# Patient Record
Sex: Male | Born: 1950 | Race: White | Hispanic: No | Marital: Married | Smoking: Former smoker
Health system: Southern US, Community
[De-identification: ages and names within clinical notes are randomized; demographics above are authoritative.]

## PROBLEM LIST (undated history)

## (undated) DIAGNOSIS — M1990 Unspecified osteoarthritis, unspecified site: Secondary | ICD-10-CM

## (undated) DIAGNOSIS — G971 Other reaction to spinal and lumbar puncture: Secondary | ICD-10-CM

## (undated) DIAGNOSIS — Z87442 Personal history of urinary calculi: Secondary | ICD-10-CM

## (undated) DIAGNOSIS — K219 Gastro-esophageal reflux disease without esophagitis: Secondary | ICD-10-CM

## (undated) DIAGNOSIS — I4891 Unspecified atrial fibrillation: Secondary | ICD-10-CM

## (undated) HISTORY — DX: Unspecified atrial fibrillation: I48.91

## (undated) HISTORY — DX: Gastro-esophageal reflux disease without esophagitis: K21.9

## (undated) HISTORY — PX: COLONOSCOPY: SHX174

## (undated) HISTORY — PX: OTHER SURGICAL HISTORY: SHX169

## (undated) HISTORY — PX: UPPER GASTROINTESTINAL ENDOSCOPY: SHX188

## (undated) HISTORY — PX: INGUINAL HERNIA REPAIR: SUR1180

---

## 2004-05-20 ENCOUNTER — Ambulatory Visit (HOSPITAL_COMMUNITY): Admission: RE | Admit: 2004-05-20 | Discharge: 2004-05-20 | Payer: Self-pay | Admitting: Otolaryngology

## 2004-05-20 ENCOUNTER — Encounter (INDEPENDENT_AMBULATORY_CARE_PROVIDER_SITE_OTHER): Payer: Self-pay | Admitting: Specialist

## 2009-03-08 ENCOUNTER — Encounter (INDEPENDENT_AMBULATORY_CARE_PROVIDER_SITE_OTHER): Payer: Self-pay | Admitting: *Deleted

## 2009-05-19 ENCOUNTER — Ambulatory Visit: Payer: Self-pay | Admitting: Gastroenterology

## 2009-06-02 ENCOUNTER — Ambulatory Visit: Payer: Self-pay | Admitting: Gastroenterology

## 2010-12-02 NOTE — H&P (Signed)
NAME:  ABDIRIZAK, RICHISON NO.:  [id number]   MEDICAL RECORD NO.:  [id number]          PATIENT TYPE:  OIB   LOCATION:  2899                         FACILITY:  [facility]   PHYSICIAN:  Hermelinda Medicus, M.D.   DATE OF BIRTH:  [birth year]   DATE OF ADMISSION:  05/20/2004  DATE OF DISCHARGE:                                HISTORY & PHYSICAL   HISTORY OF PRESENT ILLNESS:  This patient is a 60 year old male who has had  reflux esophagitis problems in the past.  He has been a smoker in the past  but quit in 1986 and he apparently has had 29 years of breathing a  considerable amount of lumbar or sawdust-type material.  He has also had a  considerable amount of crampy lower abdominal pain, not noticed any specific  food intolerance's but was felt to have had chronic gastroesophageal reflux  disease, rule out Barrett's mucosa, felt to have some irritable bowel  syndrome.  He had an esophagoscopy by Dr. Sheryn Bison who took  photographs and showed an area, a raised area on the right lateral arytenoid  region but also a whitish raised area on the arytenoid on the right medial  aspect near the arytenoid and junction of the vocal cord.  He did this in  September.  The patient delayed in getting himself seen and treated but were  here for a microlaryngoscopy and true vocal cord stripping on that right  side.  His general health is really quite excellent.  He takes just Aciphex  p.r.n., 20 mg, will drink some beer occasionally and does not smoke, has not  smoked since 1986.  He has had two inguinal hernia repairs on the right and  one on the left and endoscopy which was Dr. Norval Gable procedure.  His  stress test was done and he showed an irregular heartbeat but a normal test.  His remainder of his other health situation is good.  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   PHYSICAL  EXAMINATION:  VITAL SIGNS:  His general physical examination  reveals a blood pressure of 138/93, pulse 63, weighs 218, his height is 74  inches.  HEENT:  Ears are clear.  Tympanic membranes are clear.  Oral cavity is  clear.  No ulceration or mass.  NECK:  Free of any thyromegaly, cervical adenopathy or mass.  His larynx  shows on the right side a medial arytenoid on the true vocal cord, a raised  whitish irregular-type lesion and also laterally on the arytenoid area just  in the area of a lot of cold, a raised polypoid-looking area, both on the  right.  His remainder of his larynx looks excellent.  True cords, false  cords, epiglottis, base of tongue otherwise looked excellent and his true  cord mobility and gag reflex, tongue mobility, EOM's, facial nerve are all  symmetrical as is shoulder strength.  CHEST:  Clear.  No rales, rhonchi or wheezes.  CARDIOVASCULAR:  No heaves, rubs, murmurs or gallops.  ABDOMEN:  Free of any organomegaly, tenderness or mass.  EXTREMITIES:  Unremarkable.   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   PLAN:  Our plan is for a microlaryngoscopy and true vocal cord biopsy  stripping.       JC/MEDQ  D:  05/20/2004  T:  05/20/2004  Job:  [id number]   cc:   Vania Rea. Jarold Motto, M.D. [facility]   Ernestina Penna, M.D.  [address] [place]  [place] [address]  Fax: [phone]

## 2010-12-02 NOTE — Op Note (Signed)
NAMEWILMAN, Kevin Doyle NO.:  [id number]   MEDICAL RECORD NO.:  [id number]          PATIENT TYPE:  OIB   LOCATION:  2899                         FACILITY:  [facility]   PHYSICIAN:  Hermelinda Medicus, M.D.   DATE OF BIRTH:  01-22-1951   DATE OF PROCEDURE:  05/20/2004  DATE OF DISCHARGE:  05/20/2004                                 OPERATIVE REPORT   PREOPERATIVE DIAGNOSIS:  Right arytenoid lesion, raised, irregular, and  lateral area of epiglottic fold raised polypoid area.   POSTOPERATIVE DIAGNOSIS:  Right arytenoid lesion, raised, irregular, and  lateral area of epiglottic fold raised polypoid area.   OPERATION:  Microlaryngoscopy, true vocal cord biopsy of the right arytenoid  and right area of epiglottic fold region.   SURGEON:  Hermelinda Medicus, M.D.   ANESTHESIA:  General endotracheal with Dr. Katrinka Blazing   PROCEDURE:  The patient was placed in the supine position.  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  We  also had to place a microlaryngoscopy scope with a suspension being also  very careful to evaluate his quite narrow and difficult to see larynx.  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  Then, we evaluated using the anterior commissure  scope which was much more narrow.  The arytenoid region and the right  arytenoid was the area of attention which showed this raised leukoplakic  type region just at the junction of the arytenoid vocal process and the true  vocal cord.  Once this was biopsied, we then examined the entire larynx,  anterior commissure, both subglottic, anterior commissure, posterior  commissure, subglottic and supraglottic  area, laryngeal surface of the  epiglottis, lateral pharyngeal walls, lingual surface of the epiglottis, all  looked to be within normal limits.  The scope was then slowly retracted.  A  tooth protector was used throughout the procedure.  The high powered  microscope was used in both biopsies and the  patient tolerated the procedure very well, appeared to have no problem with  his temporomandibular joint or jaw dislocation or problem of that type.  He  was awakened and doing well.  He will be on voice rest and we will follow  him in five days, ten days, three weeks, six weeks, and three months.       JC/MEDQ  D:  05/20/2004  T:  05/20/2004  Job:  [id number]   cc:   Vania Rea. Jarold Motto, M.D. [facility]   Ernestina Penna, M.D.  [address] [place]  [place] [address]  Fax: [phone]

## 2012-04-24 ENCOUNTER — Telehealth: Payer: Self-pay | Admitting: Gastroenterology

## 2012-04-24 NOTE — Telephone Encounter (Signed)
Pt is complaining of nausea and epigastric pain. Pt has a fever of 102. Instructed pts wife to see pts PCP. Pt aware.

## 2012-04-24 NOTE — Telephone Encounter (Signed)
Husband saw his PCP and was told his problems may be coming from his stomach. PCP wants to start him on antibiotics due to elevated WBC. Wife called and wants to know if they can wait until Friday to see Dr. Jarold Motto. Let her know he should start on the medicine from the PCP as soon as they get it and keep his already scheduled appt with Dr. Jarold Motto. Wife verbalized understanding.

## 2012-04-26 ENCOUNTER — Encounter: Payer: Self-pay | Admitting: Gastroenterology

## 2012-04-26 ENCOUNTER — Ambulatory Visit (INDEPENDENT_AMBULATORY_CARE_PROVIDER_SITE_OTHER): Payer: Managed Care, Other (non HMO) | Admitting: Gastroenterology

## 2012-04-26 ENCOUNTER — Ambulatory Visit
Admission: RE | Admit: 2012-04-26 | Discharge: 2012-04-26 | Disposition: A | Discharge status: Home / Self Care | Payer: Managed Care, Other (non HMO) | Source: Ambulatory Visit | Attending: Gastroenterology | Admitting: Gastroenterology

## 2012-04-26 VITALS — BP 120/72 | HR 88 | Ht 73.0 in | Wt 212.0 lb

## 2012-04-26 DIAGNOSIS — R0789 Other chest pain: Secondary | ICD-10-CM

## 2012-04-26 DIAGNOSIS — R109 Unspecified abdominal pain: Secondary | ICD-10-CM

## 2012-04-26 MED ORDER — TRAMADOL HCL 50 MG PO TABS: 50.0000 mg | ORAL_TABLET | Freq: Four times a day (QID) | ORAL | Status: DC | PRN

## 2012-04-26 MED ORDER — HYOSCYAMINE SULFATE 0.125 MG SL SUBL: 0.1250 mg | SUBLINGUAL_TABLET | SUBLINGUAL | Status: DC | PRN

## 2012-04-26 NOTE — Patient Instructions (Addendum)
Your Abdominal Ultrasound is scheduled on today 04/26/2012 at 1pm. Please arrive at 12:45 for your appointment [facility] [address]  Nothing else to eat or drink today until after your test We will send in your prescriptions to your pharmacy

## 2012-04-26 NOTE — Progress Notes (Signed)
History of Present Illness:  This is a 61 year old Caucasian male with 2 weeks of intermittent epigastric day subxiphoid pain with sudden onset, associated nausea vomiting, and allegedly fever of 102. He has been seen by primary care, and labs are pending. He denies a specific hepatobiliary complaints, but has had gnawing epigastric discomfort and some acid reflux symptoms for years. He currently is on Prilosec 20 mg a day, also Bactrim twice a day because of mild leukocytosis. He denies significant lower GI complaints, and had a negative colonoscopy exam here in November of 2010. He denies abuse of alcohol, cigarettes, or NSAIDs. Apparently recent EKG exam also is been normal.  I have reviewed this patient's present history, medical and surgical past history, allergies and medications.     ROS: The remainder of the 10 point ROS is negative     Physical Exam: Blood pressure 120/72, pulse 88 and regular, and weight 212 pounds with a BMI of 27.97. General well developed well nourished patient in no acute distress, appearing his stated age Eyes PERRLA, no icterus, fundoscopic exam per opthamologist Skin no lesions noted Neck supple, no adenopathy, no thyroid enlargement, no tenderness Chest clear to percussion and auscultation Heart no significant murmurs, gallops or rubs noted Abdomen no hepatosplenomegaly masses or tenderness, BS normal.  Extremities no acute joint lesions, edema, phlebitis or evidence of cellulitis. Neurologic patient oriented x 3, cranial nerves intact, no focal neurologic deficits noted. Psychological mental status normal and normal affect.  Assessment and plan: Rule out cholelithiasis with associated abdominal pain, nausea vomiting, and low-grade fever. I have set him up for ultrasound ASAP, also we'll proceed with endoscopic exam ASAP. I have asked him to continue daily Prilosec, when necessary sublingual Levsin, when necessary tramadol 50 mg every 46 hours, and to go to  the emergency room should he have worsening of his complaints were more severe pain. Labs are been requested for review. The patient has had long-term acid reflux, and may have an associated prominent hiatal hernia as his primary problem.  Encounter Diagnoses  Name Primary?  . Abdominal  pain, other specified site Yes  . Non-cardiac chest pain

## 2012-04-29 ENCOUNTER — Encounter: Payer: Self-pay | Admitting: Gastroenterology

## 2012-04-29 ENCOUNTER — Telehealth: Payer: Self-pay | Admitting: *Deleted

## 2012-04-29 ENCOUNTER — Ambulatory Visit (AMBULATORY_SURGERY_CENTER): Payer: Managed Care, Other (non HMO) | Admitting: Gastroenterology

## 2012-04-29 VITALS — BP 137/82 | HR 65 | Temp 97.8°F | Resp 16 | Ht 73.0 in | Wt 212.0 lb

## 2012-04-29 DIAGNOSIS — R0789 Other chest pain: Secondary | ICD-10-CM

## 2012-04-29 DIAGNOSIS — K2990 Gastroduodenitis, unspecified, without bleeding: Secondary | ICD-10-CM

## 2012-04-29 DIAGNOSIS — K8020 Calculus of gallbladder without cholecystitis without obstruction: Secondary | ICD-10-CM

## 2012-04-29 DIAGNOSIS — R109 Unspecified abdominal pain: Secondary | ICD-10-CM

## 2012-04-29 DIAGNOSIS — N2889 Other specified disorders of kidney and ureter: Secondary | ICD-10-CM

## 2012-04-29 DIAGNOSIS — K2900 Acute gastritis without bleeding: Secondary | ICD-10-CM

## 2012-04-29 DIAGNOSIS — K2970 Gastritis, unspecified, without bleeding: Secondary | ICD-10-CM

## 2012-04-29 MED ORDER — SODIUM CHLORIDE 0.9 % IV SOLN: 500.0000 mL | INTRAVENOUS | Status: DC

## 2012-04-29 NOTE — Progress Notes (Signed)
First and second attempts for IV insertion unsucessful by Ardeen Jourdain RN- right wrist and right upper forearm.

## 2012-04-29 NOTE — Telephone Encounter (Signed)
Informed pt of his appt tomorrow with Dr Isabel Caprice at [facility], 04/30/12 at 10:30am; his wife is aware also. I have sent Elane Fritz at [facility] a Staff Msg for a surgical appt. Faxed info to [phone]. Informed pt of his appt with Dr Abbey Chatters on 05/13/12 at 1:30pm. Pt stated understanding.  Patient is scheduled to see Dr. Avel Peace on 05/13/12 @ 1:30pm, arrive @ 1:00pm. If you have any questions please call [phone].

## 2012-04-29 NOTE — Patient Instructions (Addendum)
YOU HAD AN ENDOSCOPIC PROCEDURE TODAY AT THE [facility]: Refer to the procedure report that was given to you for any specific questions about what was found during the examination.  If the procedure report does not answer your questions, please call your gastroenterologist to clarify.  If you requested that your care partner not be given the details of your procedure findings, then the procedure report has been included in a sealed envelope for you to review at your convenience later.  YOU SHOULD EXPECT: Some feelings of bloating in the abdomen. Passage of more gas than usual.  Walking can help get rid of the air that was put into your GI tract during the procedure and reduce the bloating. If you had a lower endoscopy (such as a colonoscopy or flexible sigmoidoscopy) you may notice spotting of blood in your stool or on the toilet paper. If you underwent a bowel prep for your procedure, then you may not have a normal bowel movement for a few days.  DIET: Your first meal following the procedure should be a light meal and then it is ok to progress to your normal diet.  A half-sandwich or bowl of soup is an example of a good first meal.  Heavy or fried foods are harder to digest and may make you feel nauseous or bloated.  Likewise meals heavy in dairy and vegetables can cause extra gas to form and this can also increase the bloating.  Drink plenty of fluids but you should avoid alcoholic beverages for 24 hours.  ACTIVITY: Your care partner should take you home directly after the procedure.  You should plan to take it easy, moving slowly for the rest of the day.  You can resume normal activity the day after the procedure however you should NOT DRIVE or use heavy machinery for 24 hours (because of the sedation medicines used during the test).    SYMPTOMS TO REPORT IMMEDIATELY: A gastroenterologist can be reached at any hour.  During normal business hours, 8:30 AM to 5:00 PM Monday through Friday,  call [phone].  After hours and on weekends, please call the GI answering service at [phone] who will take a message and have the physician on call contact you.   Following lower endoscopy (colonoscopy or flexible sigmoidoscopy):  Excessive amounts of blood in the stool  Significant tenderness or worsening of abdominal pains  Swelling of the abdomen that is new, acute  Fever of 100F or higher  Following upper endoscopy (EGD)  Vomiting of blood or coffee ground material  New chest pain or pain under the shoulder blades  Painful or persistently difficult swallowing  New shortness of breath  Fever of 100F or higher  Black, tarry-looking stools  FOLLOW UP: If any biopsies were taken you will be contacted by phone or by letter within the next 1-3 weeks.  Call your gastroenterologist if you have not heard about the biopsies in 3 weeks.  Our staff will call the home number listed on your records the next business day following your procedure to check on you and address any questions or concerns that you may have at that time regarding the information given to you following your procedure. This is a courtesy call and so if there is no answer at the home number and we have not heard from you through the emergency physician on call, we will assume that you have returned to your regular daily activities without incident.  SIGNATURES/CONFIDENTIALITY: You and/or your care   partner have signed paperwork which will be entered into your electronic medical record.  These signatures attest to the fact that that the information above on your After Visit Summary has been reviewed and is understood.  Full responsibility of the confidentiality of this discharge information lies with you and/or your care-partner.  

## 2012-04-29 NOTE — Progress Notes (Signed)
Patient did not experience any of the following events: a burn prior to discharge; a fall within the facility; wrong site/side/patient/procedure/implant event; or a hospital transfer or hospital admission upon discharge from the facility. (G8907) Patient did not have preoperative order for IV antibiotic SSI prophylaxis. (G8918)  

## 2012-04-29 NOTE — Op Note (Signed)
[facility] [address] [place], [address]   ENDOSCOPY PROCEDURE REPORT  PATIENT: Kevin, Doyle  MR#: [id number] BIRTHDATE: 1951-07-11 , 61  yrs. old GENDER: Male ENDOSCOPIST:David Hale Bogus, MD, Clementeen Graham REFERRED BY: Rudi Heap, M.D. PROCEDURE DATE:  04/29/2012 PROCEDURE:   EGD w/ biopsy ASA CLASS:    Class II INDICATIONS: epigastric pain, abnormal ultrasound of the GI tract,, nausea, and vomiting. MEDICATION: Propofol (Diprivan) 220 mg IV TOPICAL ANESTHETIC:  DESCRIPTION OF PROCEDURE:   After the risks and benefits of the procedure were explained, informed consent was obtained.  The LB GIF-H180 [id number]  endoscope was introduced through the mouth  and advanced to the second portion of the duodenum .  The instrument was slowly withdrawn as the mucosa was fully examined.      STOMACH: Abnormal gastric findal mucosa was found. linear erosions noted but no ulcerations or bleeding. Multiple biopsies were performed.    Retroflexed views revealed see pictures.. Otherwise normal exam...no esophagitis,ulcers,tec.   The scope was then withdrawn from the patient and the procedure completed.  COMPLICATIONS: There were no complications.   ENDOSCOPIC IMPRESSION: Abnormal mucosa was found; multiple biopsies ...gastritis from n and v episodes from symptomatic gallstones !!!  RECOMMENDATIONS: surgical referral for laproscopic cholecystectomt,,,,also UROLOGY referral per kidney mass seen on ultrasound exam.    _______________________________ eSigned:  Mardella Layman, MD, [facility] 04/29/2012 10:26 AM   standard discharge   PATIENT NAME:  Kevin Doyle, Kevin Doyle MR#: [id number]

## 2012-04-30 ENCOUNTER — Telehealth: Payer: Self-pay | Admitting: *Deleted

## 2012-04-30 NOTE — Telephone Encounter (Signed)
  Follow up Call-  Call back number 04/29/2012  Post procedure Call Back phone  # [phone]  cell  Permission to leave phone message Yes     Patient questions:  Do you have a fever, pain , or abdominal swelling? no Pain Score  0 *  Have you tolerated food without any problems? yes  Have you been able to return to your normal activities? yes  Do you have any questions about your discharge instructions: Diet   no Medications  no Follow up visit  no  Do you have questions or concerns about your Care? no  Actions: * If pain score is 4 or above: No action needed, pain <4.

## 2012-05-03 ENCOUNTER — Encounter: Payer: Self-pay | Admitting: Gastroenterology

## 2012-05-06 ENCOUNTER — Telehealth: Payer: Self-pay | Admitting: Gastroenterology

## 2012-05-06 NOTE — Telephone Encounter (Signed)
I agree

## 2012-05-06 NOTE — Telephone Encounter (Signed)
Pt read the insert for Prilosec OTC and instructions are to take for 14 days, then stop for 4 months. He wants to know how long to take it? Explained to pt, some people are on a PPI long term d/t reflux, others, may take for a short while for maybe inflammation and then stop it w/o further problems. I asked pt about his Urology visit and he stated they called him today with the CT results and he has Renal Cell Carcinoma; he will see Dr Abbey Chatters on 05/03/12. Advised pt he may want to stay on the Prilosec until he has his surgery; pt stated understanding.

## 2012-05-13 ENCOUNTER — Encounter (INDEPENDENT_AMBULATORY_CARE_PROVIDER_SITE_OTHER): Payer: Self-pay | Admitting: General Surgery

## 2012-05-13 ENCOUNTER — Ambulatory Visit (INDEPENDENT_AMBULATORY_CARE_PROVIDER_SITE_OTHER): Payer: Managed Care, Other (non HMO) | Admitting: General Surgery

## 2012-05-13 VITALS — BP 136/62 | HR 84 | Temp 97.2°F | Resp 20 | Ht 74.0 in | Wt 210.6 lb

## 2012-05-13 DIAGNOSIS — K8020 Calculus of gallbladder without cholecystitis without obstruction: Secondary | ICD-10-CM | POA: Insufficient documentation

## 2012-05-13 NOTE — Patient Instructions (Signed)
[facility] ______[facility] °LAPAROSCOPIC SURGERY: POST OP INSTRUCTIONS °Always review your discharge instruction sheet given to you by the facility where your surgery was performed. °IF YOU HAVE DISABILITY OR FAMILY LEAVE FORMS, YOU MUST BRING THEM TO THE OFFICE FOR PROCESSING.   °DO NOT GIVE THEM TO YOUR DOCTOR. ° °1. A prescription for pain medication may be given to you upon discharge.  Take your pain medication as prescribed, if needed.  If narcotic pain medicine is not needed, then you may take acetaminophen (Tylenol) or ibuprofen (Advil) as needed. °2. Take your usually prescribed medications unless otherwise directed. °3. If you need a refill on your pain medication, please contact your pharmacy.  They will contact our office to request authorization. Prescriptions will not be filled after 5pm or on week-ends. °4. You should follow a light diet the first few days after arrival home, such as soup and crackers, etc.  Be sure to include lots of fluids daily. °5. Most patients will experience some swelling and bruising in the area of the incisions.  Ice packs will help.  Swelling and bruising can take several days to resolve.  °6. It is common to experience some constipation if taking pain medication after surgery.  Increasing fluid intake and taking a stool softener (such as Colace) will usually help or prevent this problem from occurring.  A mild laxative (Milk of Magnesia or Miralax) should be taken according to package instructions if there are no bowel movements after 48 hours. °7. Unless discharge instructions indicate otherwise, you may remove your bandages 24-48 hours after surgery, and you may shower at that time.  You may have steri-strips (small skin tapes) in place directly over the incision.  These strips should be left on the skin for 7-10 days.  If your surgeon used skin glue on the incision, you may shower in 24 hours.  The glue will flake off over the next 2-3 weeks.  Any sutures or  staples will be removed at the office during your follow-up visit. °8. ACTIVITIES:  You may resume regular (light) daily activities beginning the next day--such as daily self-care, walking, climbing stairs--gradually increasing activities as tolerated.  You may have sexual intercourse when it is comfortable.  Refrain from any heavy lifting or straining until approved by your doctor. °a. You may drive when you are no longer taking prescription pain medication, you can comfortably wear a seatbelt, and you can safely maneuver your car and apply brakes. °b. RETURN TO WORK:  __________________________________________________________ °9. You should see your doctor in the office for a follow-up appointment approximately 2-3 weeks after your surgery.  Make sure that you call for this appointment within a day or two after you arrive home to insure a convenient appointment time. °10. OTHER INSTRUCTIONS: __________________________________________________________________________________________________________________________ __________________________________________________________________________________________________________________________ °WHEN TO CALL YOUR DOCTOR: °1. Fever over 101.0 °2. Inability to urinate °3. Continued bleeding from incision. °4. Increased pain, redness, or drainage from the incision. °5. Increasing abdominal pain ° °The clinic staff is available to answer your questions during regular business hours.  Please don’t hesitate to call and ask to speak to one of the nurses for clinical concerns.  If you have a medical emergency, go to the nearest emergency room or call 911.  A surgeon from [facility] is always on call at the hospital. °[address], [address], [place], [place]  [address] ? [address] [address], [place], [place]   [address] °[phone] ? [phone] ? [phone] [phone] °Web site:   [email or web address] °

## 2012-05-13 NOTE — Progress Notes (Signed)
Patient ID: Kevin Doyle, male   DOB: August 11, 1950, 61 y.o.   MRN: [id number]  No chief complaint on file.   HPI Kevin Doyle is a 61 y.o. male.   HPI  He is referred by Dr. Sheryn Bison for evaluation of symptomatic cholelithiasis. He has had 2 episodes of severe epigastric pain one of which was associated with nausea and vomiting. He states he had a fever as well. He went to his primary care physician's office and was referred to Dr. Jarold Motto for further evaluation. An upper and endoscopy did not demonstrate any significant pathology. An abdominal ultrasound was performed which demonstrated cholelithiasis with multiple gallstones. It also demonstrated a solid right kidney mass. CT scan of the right kidney mass was concerning for renal cell carcinoma. He is going to see Dr. Laverle Patter tomorrow to discuss minimally invasive partial right nephrectomy for this. He is here today to discuss cholecystectomy.  He has a chronically elevated bilirubin he tells me. His mother had gallstones.  Past Medical History  Diagnosis Date  . GERD (gastroesophageal reflux disease)     Past Surgical History  Procedure Date  . Hernia repairs     x 3   . Colonoscopy   . Upper gastrointestinal endoscopy   . Inguinal hernia repair 1986    Family History  Problem Relation Age of Onset  . Colon cancer Neg Hx   . Lung cancer Mother   . Brain cancer Father     Social History History  Substance Use Topics  . Smoking status: Former Games developer  . Smokeless tobacco: Never Used   Comment: Quit at age 75   . Alcohol Use: Yes     Rare-Beer     No Known Allergies  Current Outpatient Prescriptions  Medication Sig Dispense Refill  . omeprazole (PRILOSEC OTC) 20 MG tablet Take 20 mg by mouth daily.        Review of Systems Review of Systems  Constitutional: Positive for fever. Negative for chills.  Respiratory: Negative.   Cardiovascular: Negative.   Gastrointestinal: Positive for nausea and constipation.    Genitourinary: Negative for hematuria and difficulty urinating.  Hematological: Negative.     Blood pressure 136/62, pulse 84, temperature 97.2 F (36.2 C), temperature source Temporal, resp. rate 20, height 6\' 2"  (1.88 m), weight 210 lb 9.6 oz (95.528 kg).  Physical Exam Physical Exam  Constitutional: He appears well-developed and well-nourished. No distress.  Eyes: EOM are normal. Scleral icterus (slight) is present.  Neck: Neck supple.  Cardiovascular: Normal rate and regular rhythm.   Pulmonary/Chest: Effort normal and breath sounds normal.  Abdominal: Soft. He exhibits no distension and no mass. There is no tenderness.  Musculoskeletal: He exhibits no edema.  Lymphadenopathy:    He has no cervical adenopathy.  Skin: Skin is warm and dry.    Data Reviewed Bilirubin 3.9 on 04/24/12.  EGD results reviewed.  Assessment    Symptomatic cholelithiasis. 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    Plan    Laparoscopic cholecystectomy with cholangiogram. I will coordinate this with Dr. Laverle Patter.  I have explained the procedure, risks, and aftercare of cholecystectomy.  Risks include but are not limited to bleeding, infection, wound problems, anesthesia, diarrhea, bile leak, injury to common bile duct/liver/intestine.  He seems to understand and agrees to proceed.        Keondrick Dilks J 05/13/2012, 2:11 PM

## 2012-05-14 ENCOUNTER — Other Ambulatory Visit (HOSPITAL_COMMUNITY): Payer: Self-pay | Admitting: Urology

## 2012-05-14 ENCOUNTER — Ambulatory Visit (HOSPITAL_COMMUNITY)
Admission: RE | Admit: 2012-05-14 | Discharge: 2012-05-14 | Disposition: A | Discharge status: Home / Self Care | Payer: Managed Care, Other (non HMO) | Source: Ambulatory Visit | Attending: Urology | Admitting: Urology

## 2012-05-14 DIAGNOSIS — C649 Malignant neoplasm of unspecified kidney, except renal pelvis: Secondary | ICD-10-CM

## 2012-05-17 ENCOUNTER — Inpatient Hospital Stay (HOSPITAL_COMMUNITY)
Admission: EM | Admit: 2012-05-17 | Discharge: 2012-05-19 | DRG: 419 | Disposition: A | Discharge status: Home / Self Care | Payer: Managed Care, Other (non HMO) | Attending: Surgery | Admitting: Surgery

## 2012-05-17 ENCOUNTER — Other Ambulatory Visit: Payer: Self-pay | Admitting: Urology

## 2012-05-17 ENCOUNTER — Telehealth (INDEPENDENT_AMBULATORY_CARE_PROVIDER_SITE_OTHER): Payer: Self-pay | Admitting: General Surgery

## 2012-05-17 ENCOUNTER — Encounter (HOSPITAL_COMMUNITY): Payer: Self-pay | Admitting: Emergency Medicine

## 2012-05-17 ENCOUNTER — Telehealth: Payer: Self-pay | Admitting: Gastroenterology

## 2012-05-17 DIAGNOSIS — R109 Unspecified abdominal pain: Secondary | ICD-10-CM

## 2012-05-17 DIAGNOSIS — K219 Gastro-esophageal reflux disease without esophagitis: Secondary | ICD-10-CM | POA: Diagnosis present

## 2012-05-17 DIAGNOSIS — N2889 Other specified disorders of kidney and ureter: Secondary | ICD-10-CM | POA: Diagnosis present

## 2012-05-17 DIAGNOSIS — N289 Disorder of kidney and ureter, unspecified: Secondary | ICD-10-CM | POA: Diagnosis present

## 2012-05-17 DIAGNOSIS — K819 Cholecystitis, unspecified: Principal | ICD-10-CM | POA: Diagnosis present

## 2012-05-17 LAB — CBC WITH DIFFERENTIAL/PLATELET
Basophils Absolute: 0 10*3/uL (ref 0.0–0.1)
Basophils Relative: 0 % (ref 0–1)
MCHC: 34.3 g/dL (ref 30.0–36.0)
Monocytes Absolute: 0.5 10*3/uL (ref 0.1–1.0)
Neutro Abs: 10.9 10*3/uL — ABNORMAL HIGH (ref 1.7–7.7)
Neutrophils Relative %: 90 % — ABNORMAL HIGH (ref 43–77)
Platelets: 261 10*3/uL (ref 150–400)
RDW: 12.3 % (ref 11.5–15.5)
WBC: 12.1 10*3/uL — ABNORMAL HIGH (ref 4.0–10.5)

## 2012-05-17 LAB — COMPREHENSIVE METABOLIC PANEL
ALT: 10 U/L (ref 0–53)
AST: 17 U/L (ref 0–37)
Albumin: 3.6 g/dL (ref 3.5–5.2)
Chloride: 102 mEq/L (ref 96–112)
Creatinine, Ser: 0.8 mg/dL (ref 0.50–1.35)
Potassium: 3.3 mEq/L — ABNORMAL LOW (ref 3.5–5.1)
Sodium: 139 mEq/L (ref 135–145)
Total Bilirubin: 0.8 mg/dL (ref 0.3–1.2)

## 2012-05-17 LAB — URINALYSIS, MICROSCOPIC ONLY
Glucose, UA: NEGATIVE mg/dL
Hgb urine dipstick: NEGATIVE
Ketones, ur: 40 mg/dL — AB
Leukocytes, UA: NEGATIVE
pH: 6 (ref 5.0–8.0)

## 2012-05-17 MED ORDER — CHLORHEXIDINE GLUCONATE 0.12 % MT SOLN
15.0000 mL | Freq: Two times a day (BID) | OROMUCOSAL | Status: DC
  Administered 2012-05-18 – 2012-05-19 (×4): 15 mL via OROMUCOSAL
  Filled 2012-05-17 (×7): qty 15

## 2012-05-17 MED ORDER — ACETAMINOPHEN 650 MG RE SUPP: 650.0000 mg | Freq: Four times a day (QID) | RECTAL | Status: DC | PRN

## 2012-05-17 MED ORDER — HYDROMORPHONE HCL PF 1 MG/ML IJ SOLN
0.5000 mg | INTRAMUSCULAR | Status: DC | PRN
  Administered 2012-05-18 (×4): 1 mg via INTRAVENOUS
  Administered 2012-05-18: 0.5 mg via INTRAVENOUS
  Administered 2012-05-19: 1 mg via INTRAVENOUS
  Filled 2012-05-17 (×4): qty 1

## 2012-05-17 MED ORDER — SODIUM CHLORIDE 0.9 % IV SOLN: 1000.0000 mL | Freq: Once | INTRAVENOUS | Status: DC

## 2012-05-17 MED ORDER — CIPROFLOXACIN IN D5W 400 MG/200ML IV SOLN
400.0000 mg | Freq: Two times a day (BID) | INTRAVENOUS | Status: DC
  Administered 2012-05-18 – 2012-05-19 (×3): 400 mg via INTRAVENOUS
  Filled 2012-05-17 (×4): qty 200

## 2012-05-17 MED ORDER — KCL IN DEXTROSE-NACL 40-5-0.45 MEQ/L-%-% IV SOLN
INTRAVENOUS | Status: DC
  Administered 2012-05-17 – 2012-05-19 (×5): via INTRAVENOUS
  Filled 2012-05-17 (×8): qty 1000

## 2012-05-17 MED ORDER — ONDANSETRON HCL 4 MG/2ML IJ SOLN
4.0000 mg | Freq: Once | INTRAMUSCULAR | Status: DC
  Filled 2012-05-17: qty 2

## 2012-05-17 MED ORDER — HYDROMORPHONE HCL PF 1 MG/ML IJ SOLN
1.0000 mg | Freq: Once | INTRAMUSCULAR | Status: AC
  Administered 2012-05-17: 1 mg via INTRAVENOUS
  Filled 2012-05-17: qty 1

## 2012-05-17 MED ORDER — HYDROMORPHONE HCL PF 1 MG/ML IJ SOLN
0.5000 mg | Freq: Once | INTRAMUSCULAR | Status: DC
  Filled 2012-05-17 (×3): qty 1

## 2012-05-17 MED ORDER — FENTANYL CITRATE 0.05 MG/ML IJ SOLN: 50.0000 ug | Freq: Once | INTRAMUSCULAR | Status: DC

## 2012-05-17 MED ORDER — PANTOPRAZOLE SODIUM 40 MG PO TBEC
40.0000 mg | DELAYED_RELEASE_TABLET | Freq: Two times a day (BID) | ORAL | Status: DC
  Administered 2012-05-18 – 2012-05-19 (×2): 40 mg via ORAL
  Filled 2012-05-17 (×5): qty 1

## 2012-05-17 MED ORDER — BIOTENE DRY MOUTH MT LIQD
15.0000 mL | Freq: Two times a day (BID) | OROMUCOSAL | Status: DC
  Administered 2012-05-18 (×2): 15 mL via OROMUCOSAL

## 2012-05-17 MED ORDER — DIPHENHYDRAMINE HCL 12.5 MG/5ML PO ELIX: 12.5000 mg | ORAL_SOLUTION | Freq: Four times a day (QID) | ORAL | Status: DC | PRN

## 2012-05-17 MED ORDER — ONDANSETRON HCL 4 MG/2ML IJ SOLN
4.0000 mg | Freq: Four times a day (QID) | INTRAMUSCULAR | Status: DC | PRN
  Administered 2012-05-17: 4 mg via INTRAVENOUS

## 2012-05-17 MED ORDER — DIPHENHYDRAMINE HCL 50 MG/ML IJ SOLN: 12.5000 mg | Freq: Four times a day (QID) | INTRAMUSCULAR | Status: DC | PRN

## 2012-05-17 MED ORDER — ACETAMINOPHEN 325 MG PO TABS: 650.0000 mg | ORAL_TABLET | Freq: Four times a day (QID) | ORAL | Status: DC | PRN

## 2012-05-17 MED ORDER — OXYCODONE HCL 5 MG PO TABS
5.0000 mg | ORAL_TABLET | ORAL | Status: DC | PRN
  Administered 2012-05-18 – 2012-05-19 (×2): 10 mg via ORAL
  Filled 2012-05-17 (×2): qty 2

## 2012-05-17 NOTE — Consult Note (Signed)
History and physical Referring Physician: ER Estell Harpin   Kevin Doyle is an 61 y.o. male.  HPI:The  Pt is a 61 y/o male who had abdominal pain about 4 weeks ago and thought it was a GI bug.  Pain returned about 1 weeks later, and he had nausea, vomiting, and fever up to 102.8 then passed .  He's been maintaining a low fat diet, and may have lost some weight doing well last 2 weeks and then had pain today around lunch time.  He had cereal and a muffin for breakfast.  Pain was so severe he could not tolerate it and he was sent to the ER at [facility].  His pain is easing up and he feels a little better.  He was also found to have a Renal mass on ultrasound which is consistent with a renal cancer.  He is scheduled to have both his GB and Renal mass removed 06/27/12 by Dr. Laverle Patter and Dr. Abbey Chatters, in a combined procedure.  He is miserable with these episodes, and we plan to admit and do Cholecystectomy this weekend.  Past Medical History  Diagnosis Date  . GERD (gastroesophageal reflux disease) Right Renal Mass     Past Surgical History  Procedure Date  . Hernia repairs two repairs on right and one on left.     x 3   . Colonoscopy   . Upper gastrointestinal endoscopy   . Inguinal hernia repair 1986    Family History  Problem Relation Age of Onset  . Colon cancer Neg Hx   . Lung cancer Mother   . Brain cancer Father 2 brothers, both in good health.     Social History:  reports that he has quit smoking. He has never used smokeless tobacco. He reports that he drinks alcohol. He reports that he does not use illicit drugs.  Allergies: No Known Allergies  Medications:  Prior to Admission:  (Not in a hospital admission) Scheduled:   .  HYDROmorphone (DILAUDID) injection  0.5 mg Intravenous Once  . ondansetron  4 mg Intravenous Once  . DISCONTD: fentaNYL  50 mcg Intravenous Once  . DISCONTD: ondansetron (ZOFRAN) IV  4 mg Intravenous Once   Continuous:   . sodium chloride    . ciprofloxacin      . dextrose 5 % and 0.45 % NaCl with KCl 40 mEq/L     XBJ:[id number], acetaminophen, diphenhydrAMINE, diphenhydrAMINE, HYDROmorphone (DILAUDID) injection, ondansetron, oxyCODONE  Results for orders placed during the hospital encounter of 05/17/12 (from the past 48 hour(s))  CBC WITH DIFFERENTIAL     Status: Abnormal   Collection Time   05/17/12  4:10 PM      Component Value Range Comment   WBC 12.1 (*) 4.0 - 10.5 K/uL    RBC 4.29  4.22 - 5.81 MIL/uL    Hemoglobin 12.6 (*) 13.0 - 17.0 g/dL    HCT 57.8 (*) 46.9 - 52.0 %    MCV 85.5  78.0 - 100.0 fL    MCH 29.4  26.0 - 34.0 pg    MCHC 34.3  30.0 - 36.0 g/dL    RDW 62.9  52.8 - 41.3 %    Platelets 261  150 - 400 K/uL    Neutrophils Relative 90 (*) 43 - 77 %    Neutro Abs 10.9 (*) 1.7 - 7.7 K/uL    Lymphocytes Relative 6 (*) 12 - 46 %    Lymphs Abs 0.7  0.7 - 4.0 K/uL    Monocytes  Relative 4  3 - 12 %    Monocytes Absolute 0.5  0.1 - 1.0 K/uL    Eosinophils Relative 0  0 - 5 %    Eosinophils Absolute 0.0  0.0 - 0.7 K/uL    Basophils Relative 0  0 - 1 %    Basophils Absolute 0.0  0.0 - 0.1 K/uL   COMPREHENSIVE METABOLIC PANEL     Status: Abnormal   Collection Time   05/17/12  4:10 PM      Component Value Range Comment   Sodium 139  135 - 145 mEq/L    Potassium 3.3 (*) 3.5 - 5.1 mEq/L    Chloride 102  96 - 112 mEq/L    CO2 25  19 - 32 mEq/L    Glucose, Bld 126 (*) 70 - 99 mg/dL    BUN 11  6 - 23 mg/dL    Creatinine, Ser 1.61  0.50 - 1.35 mg/dL    Calcium 8.7  8.4 - 09.6 mg/dL    Total Protein 7.1  6.0 - 8.3 g/dL    Albumin 3.6  3.5 - 5.2 g/dL    AST 17  0 - 37 U/L    ALT 10  0 - 53 U/L    Alkaline Phosphatase 104  39 - 117 U/L    Total Bilirubin 0.8  0.3 - 1.2 mg/dL    GFR calc non Af Amer >90  >90 mL/min    GFR calc Af Amer >90  >90 mL/min   LIPASE, BLOOD     Status: Normal   Collection Time   05/17/12  4:10 PM      Component Value Range Comment   Lipase 47  11 - 59 U/L   URINALYSIS, MICROSCOPIC ONLY     Status:  Abnormal   Collection Time   05/17/12  4:19 PM      Component Value Range Comment   Color, Urine YELLOW  YELLOW    APPearance CLOUDY (*) CLEAR    Specific Gravity, Urine 1.024  1.005 - 1.030    pH 6.0  5.0 - 8.0    Glucose, UA NEGATIVE  NEGATIVE mg/dL    Hgb urine dipstick NEGATIVE  NEGATIVE    Bilirubin Urine NEGATIVE  NEGATIVE    Ketones, ur 40 (*) NEGATIVE mg/dL    Protein, ur NEGATIVE  NEGATIVE mg/dL    Urobilinogen, UA 0.2  0.0 - 1.0 mg/dL    Nitrite NEGATIVE  NEGATIVE    Leukocytes, UA NEGATIVE  NEGATIVE    WBC, UA 0-2  <3 WBC/hpf    Urine-Other MUCOUS PRESENT       No results found.  Review of Systems  Constitutional: Positive for fever (normally after attack, up to 102.8, so far none today.) and weight loss (he's been on low fat diet, so he thinks he may have lost some weight). Negative for chills, malaise/fatigue and diaphoresis.  HENT: Negative.   Eyes: Negative.   Respiratory: Negative.   Cardiovascular: Negative.   Gastrointestinal: Positive for heartburn, vomiting (dry heaves in past, vomited today after pain started.), abdominal pain and diarrhea (started after pain.).  Genitourinary: Negative.   Musculoskeletal: Negative.   Skin: Negative.   Neurological: Negative.  Negative for weakness.  Endo/Heme/Allergies: Negative.   Psychiatric/Behavioral: Negative.    Blood pressure 165/88, pulse 67, temperature 98.3 F (36.8 C), temperature source Oral, resp. rate 20, SpO2 100.00%. Physical Exam  Constitutional: He is oriented to person, place, and time. He appears well-developed and  well-nourished. No distress.  HENT:  Head: Normocephalic and atraumatic.  Nose: Nose normal.  Eyes: Conjunctivae normal and EOM are normal. Pupils are equal, round, and reactive to light. Right eye exhibits no discharge. Left eye exhibits no discharge. No scleral icterus.  Neck: Normal range of motion. Neck supple. No JVD present. No tracheal deviation present. No thyromegaly present.    Cardiovascular: Normal rate, regular rhythm and intact distal pulses.  Exam reveals no gallop.   No murmur heard. Respiratory: Effort normal and breath sounds normal. No stridor. No respiratory distress. He has no wheezes. He has no rales. He exhibits no tenderness.  GI: Soft. Bowel sounds are normal. He exhibits no distension and no mass. There is Tenderness: RUQ, under ribs, also some pain to his back.. There is no rebound and no guarding.  Musculoskeletal: Normal range of motion. He exhibits no edema and no tenderness.  Lymphadenopathy:    He has no cervical adenopathy.  Neurological: He is alert and oriented to person, place, and time. No cranial nerve deficit.  Skin: Skin is warm and dry. No rash noted. He is not diaphoretic. No erythema. No pallor.  Psychiatric: He has a normal mood and affect. His behavior is normal. Judgment and thought content normal.    Assessment/Plan: 1.Cholecystitis, symptomatic cholelithiasis 2.Right renal mass scheduled for resection 06/27/12 3. GERD  Plan: Admit, hydrate, antibiotics and cholecystectomy this weekend. Will Marlyne Beards PA for DR. Layton.  Kevin Doyle 05/17/2012, 5:23 PM

## 2012-05-17 NOTE — ED Notes (Signed)
Report given to [facility] RN pt awaiting transport to floor

## 2012-05-17 NOTE — Telephone Encounter (Signed)
Pt met with Dr. Abbey Chatters regarding his GB and is waiting for surgery to be scheduled, coordinated with a partial nephrectomy.  He called today because he is having a gallbladder attack this morning.  While talking to him, the urology office called to notify him of date for surgery:  06/27/12.  We discussed ways to try to avoid gallbladder problems and he has medicine from Dr. Eloise Harman as well.  He understands if GB pain is severe and intractable to go to the ED.

## 2012-05-17 NOTE — ED Provider Notes (Cosign Needed)
History     CSN: [id number]  Arrival date & time 05/17/12  1355   First MD Initiated Contact with Patient 05/17/12 1604      Chief Complaint  Patient presents with  . Abdominal Pain    (Consider location/radiation/quality/duration/timing/severity/associated sxs/prior treatment) Patient is a 61 y.o. male presenting with abdominal pain. The history is provided by the patient (pt has a hx of gall stones and started with abd pain today). No language interpreter was used.  Abdominal Pain The primary symptoms of the illness include abdominal pain. The primary symptoms of the illness do not include fatigue or diarrhea. The current episode started less than 1 hour ago. The onset of the illness was sudden. The problem has not changed since onset. The illness is associated with eating. The patient states that she believes she is currently not pregnant. The patient has not had a change in bowel habit. Risk factors for an acute abdominal problem include being elderly. Symptoms associated with the illness do not include chills, hematuria, frequency or back pain. Significant associated medical issues do not include PUD.    Past Medical History  Diagnosis Date  . GERD (gastroesophageal reflux disease)     Past Surgical History  Procedure Date  . Hernia repairs     x 3   . Colonoscopy   . Upper gastrointestinal endoscopy   . Inguinal hernia repair 1986    Family History  Problem Relation Age of Onset  . Colon cancer Neg Hx   . Lung cancer Mother   . Brain cancer Father     History  Substance Use Topics  . Smoking status: Former Games developer  . Smokeless tobacco: Never Used   Comment: Quit at age 86   . Alcohol Use: Yes     Rare-Beer       Review of Systems  Constitutional: Negative for chills and fatigue.  HENT: Negative for congestion, sinus pressure and ear discharge.   Eyes: Negative for discharge.  Respiratory: Negative for cough.   Cardiovascular: Negative for chest pain.    Gastrointestinal: Positive for abdominal pain. Negative for diarrhea.  Genitourinary: Negative for frequency and hematuria.  Musculoskeletal: Negative for back pain.  Skin: Negative for rash.  Neurological: Negative for seizures and headaches.  Hematological: Negative.   Psychiatric/Behavioral: Negative for hallucinations.    Allergies  Review of patient's allergies indicates no known allergies.  Home Medications   Current Outpatient Rx  Name Route Sig Dispense Refill  . ASPIRIN EC 81 MG PO TBEC Oral Take 81 mg by mouth daily.    Marland Kitchen HYOSCYAMINE SULFATE 0.125 MG PO TABS Oral Take 0.125 mg by mouth every 4 (four) hours as needed. For spasms.    . ADULT MULTIVITAMIN W/MINERALS CH Oral Take 1 tablet by mouth daily.    Marland Kitchen OMEPRAZOLE MAGNESIUM 20 MG PO TBEC Oral Take 20 mg by mouth daily.    . TRAMADOL HCL 50 MG PO TABS Oral Take 50 mg by mouth every 6 (six) hours as needed. For pain.      BP 165/88  Pulse 67  Temp 98.3 F (36.8 C) (Oral)  Resp 20  SpO2 100%  Physical Exam  Constitutional: He is oriented to person, place, and time. He appears well-developed.  HENT:  Head: Normocephalic and atraumatic.  Eyes: Conjunctivae normal and EOM are normal. No scleral icterus.  Neck: Neck supple. No thyromegaly present.  Cardiovascular: Normal rate and regular rhythm.  Exam reveals no gallop and no friction rub.  No murmur heard. Pulmonary/Chest: No stridor. He has no wheezes. He has no rales. He exhibits no tenderness.  Abdominal: He exhibits no distension. There is tenderness. There is no rebound.  Musculoskeletal: Normal range of motion. He exhibits no edema.  Lymphadenopathy:    He has no cervical adenopathy.  Neurological: He is oriented to person, place, and time. Coordination normal.  Skin: No rash noted. No erythema.  Psychiatric: He has a normal mood and affect. His behavior is normal.    ED Course  Procedures (including critical care time)  Labs Reviewed  CBC WITH  DIFFERENTIAL - Abnormal; Notable for the following:    WBC 12.1 (*)     Hemoglobin 12.6 (*)     HCT 36.7 (*)     Neutrophils Relative 90 (*)     Neutro Abs 10.9 (*)     Lymphocytes Relative 6 (*)     All other components within normal limits  COMPREHENSIVE METABOLIC PANEL - Abnormal; Notable for the following:    Potassium 3.3 (*)     Glucose, Bld 126 (*)     All other components within normal limits  URINALYSIS, MICROSCOPIC ONLY - Abnormal; Notable for the following:    APPearance CLOUDY (*)     Ketones, ur 40 (*)     All other components within normal limits  LIPASE, BLOOD   No results found.   1. Abdominal pain     Surgery to admit for cholecystitis  MDM  The chart was scribed for me under my direct supervision.  I personally performed the history, physical, and medical decision making and all procedures in the evaluation of this patient.Benny Lennert, MD 05/17/12 1712  Benny Lennert, MD 05/20/12 1610  Benny Lennert, MD 05/21/12 [phone]

## 2012-05-17 NOTE — Telephone Encounter (Signed)
Pt called to report he is having severe pain like before when he was having a gall bladder attack. He has heard from Dr Laverle Patter and his renal surgery is not until December. Pt reports he wanted to coordinate the surgeries, but d/t the Gall Bladder pain, he may have to have separate surgeries. He reports he has watched his diet and done well; he laid down and was napping and the pain woke him up. Advised pt to go to the ER if his pain is that bad; he reports he took a Tramadol w/o any relief. Pt stated understanding.

## 2012-05-17 NOTE — ED Notes (Signed)
Pt presenting to ed with c/o abdominal pain with positive nausea, vomiting and diarrhea. Pt states he schedule for surgery in December but this is his 3rd gallbladder attack and the pain is horrible.

## 2012-05-18 ENCOUNTER — Encounter (HOSPITAL_COMMUNITY): Payer: Self-pay | Admitting: Anesthesiology

## 2012-05-18 ENCOUNTER — Other Ambulatory Visit: Payer: Self-pay

## 2012-05-18 ENCOUNTER — Inpatient Hospital Stay (HOSPITAL_COMMUNITY): Payer: Managed Care, Other (non HMO) | Admitting: Anesthesiology

## 2012-05-18 ENCOUNTER — Encounter (HOSPITAL_COMMUNITY): Admission: EM | Disposition: A | Discharge status: Home / Self Care | Payer: Self-pay | Source: Home / Self Care

## 2012-05-18 ENCOUNTER — Inpatient Hospital Stay (HOSPITAL_COMMUNITY): Payer: Managed Care, Other (non HMO)

## 2012-05-18 DIAGNOSIS — K810 Acute cholecystitis: Secondary | ICD-10-CM

## 2012-05-18 HISTORY — PX: CHOLECYSTECTOMY: SHX55

## 2012-05-18 LAB — SURGICAL PCR SCREEN
MRSA, PCR: NEGATIVE
Staphylococcus aureus: POSITIVE — AB

## 2012-05-18 SURGERY — LAPAROSCOPIC CHOLECYSTECTOMY WITH INTRAOPERATIVE CHOLANGIOGRAM
Anesthesia: General | Site: Abdomen | Wound class: Dirty or Infected

## 2012-05-18 MED ORDER — FENTANYL CITRATE 0.05 MG/ML IJ SOLN
INTRAMUSCULAR | Status: DC | PRN
  Administered 2012-05-18: 100 ug via INTRAVENOUS
  Administered 2012-05-18 (×6): 50 ug via INTRAVENOUS

## 2012-05-18 MED ORDER — PROMETHAZINE HCL 25 MG/ML IJ SOLN: 6.2500 mg | INTRAMUSCULAR | Status: DC | PRN

## 2012-05-18 MED ORDER — NEOSTIGMINE METHYLSULFATE 1 MG/ML IJ SOLN
INTRAMUSCULAR | Status: DC | PRN
  Administered 2012-05-18: 1 mg via INTRAVENOUS

## 2012-05-18 MED ORDER — SUCCINYLCHOLINE CHLORIDE 20 MG/ML IJ SOLN
INTRAMUSCULAR | Status: DC | PRN
  Administered 2012-05-18: 100 mg via INTRAVENOUS

## 2012-05-18 MED ORDER — MUPIROCIN 2 % EX OINT
1.0000 "application " | TOPICAL_OINTMENT | Freq: Two times a day (BID) | CUTANEOUS | Status: DC
  Administered 2012-05-18 – 2012-05-19 (×3): 1 via NASAL
  Filled 2012-05-18: qty 22

## 2012-05-18 MED ORDER — KETOROLAC TROMETHAMINE 30 MG/ML IJ SOLN: 15.0000 mg | Freq: Once | INTRAMUSCULAR | Status: DC | PRN

## 2012-05-18 MED ORDER — ASPIRIN EC 81 MG PO TBEC
81.0000 mg | DELAYED_RELEASE_TABLET | Freq: Every day | ORAL | Status: DC
Start: 2012-05-18 — End: 2012-05-19
  Administered 2012-05-18 – 2012-05-19 (×2): 81 mg via ORAL
  Filled 2012-05-18 (×2): qty 1

## 2012-05-18 MED ORDER — PROPOFOL 10 MG/ML IV EMUL
INTRAVENOUS | Status: DC | PRN
  Administered 2012-05-18: 200 mg via INTRAVENOUS

## 2012-05-18 MED ORDER — CHLORHEXIDINE GLUCONATE CLOTH 2 % EX PADS
6.0000 | MEDICATED_PAD | Freq: Every day | CUTANEOUS | Status: DC
  Administered 2012-05-18: 6 via TOPICAL

## 2012-05-18 MED ORDER — ACETAMINOPHEN 10 MG/ML IV SOLN
INTRAVENOUS | Status: DC | PRN
  Administered 2012-05-18: 1000 mg via INTRAVENOUS

## 2012-05-18 MED ORDER — METOCLOPRAMIDE HCL 5 MG/ML IJ SOLN
INTRAMUSCULAR | Status: DC | PRN
  Administered 2012-05-18: 5 mg via INTRAVENOUS

## 2012-05-18 MED ORDER — LACTATED RINGERS IR SOLN
Status: DC | PRN
  Administered 2012-05-18: 1

## 2012-05-18 MED ORDER — IOHEXOL 300 MG/ML  SOLN
INTRAMUSCULAR | Status: DC | PRN
  Administered 2012-05-18: 20 mL via INTRAVENOUS

## 2012-05-18 MED ORDER — HYDROMORPHONE HCL PF 1 MG/ML IJ SOLN: 0.2500 mg | INTRAMUSCULAR | Status: DC | PRN

## 2012-05-18 MED ORDER — ONDANSETRON HCL 4 MG/2ML IJ SOLN
INTRAMUSCULAR | Status: DC | PRN
  Administered 2012-05-18 (×2): 2 mg via INTRAVENOUS

## 2012-05-18 MED ORDER — LIDOCAINE HCL (CARDIAC) 20 MG/ML IV SOLN
INTRAVENOUS | Status: DC | PRN
  Administered 2012-05-18: 75 mg via INTRAVENOUS

## 2012-05-18 MED ORDER — 0.9 % SODIUM CHLORIDE (POUR BTL) OPTIME
TOPICAL | Status: DC | PRN
  Administered 2012-05-18: 1000 mL

## 2012-05-18 MED ORDER — LACTATED RINGERS IV SOLN
INTRAVENOUS | Status: DC | PRN
  Administered 2012-05-18 (×2): via INTRAVENOUS

## 2012-05-18 MED ORDER — DEXAMETHASONE SODIUM PHOSPHATE 10 MG/ML IJ SOLN
INTRAMUSCULAR | Status: DC | PRN
  Administered 2012-05-18: 10 mg via INTRAVENOUS

## 2012-05-18 MED ORDER — BUPIVACAINE-EPINEPHRINE 0.25% -1:200000 IJ SOLN
INTRAMUSCULAR | Status: DC | PRN
  Administered 2012-05-18: 20 mL

## 2012-05-18 MED ORDER — MUPIROCIN 2 % EX OINT: 1.0000 "application " | TOPICAL_OINTMENT | Freq: Two times a day (BID) | CUTANEOUS | Status: DC

## 2012-05-18 MED ORDER — MIDAZOLAM HCL 5 MG/5ML IJ SOLN
INTRAMUSCULAR | Status: DC | PRN
  Administered 2012-05-18 (×2): 1 mg via INTRAVENOUS

## 2012-05-18 MED ORDER — CISATRACURIUM BESYLATE (PF) 10 MG/5ML IV SOLN
INTRAVENOUS | Status: DC | PRN
  Administered 2012-05-18 (×2): 2 mg via INTRAVENOUS
  Administered 2012-05-18: 8 mg via INTRAVENOUS

## 2012-05-18 MED ORDER — GLYCOPYRROLATE 0.2 MG/ML IJ SOLN
INTRAMUSCULAR | Status: DC | PRN
  Administered 2012-05-18: 0.2 mg via INTRAVENOUS

## 2012-05-18 SURGICAL SUPPLY — 47 items
APPLIER CLIP 5 13 M/L LIGAMAX5 (MISCELLANEOUS) ×2
APPLIER CLIP ROT 10 11.4 M/L (STAPLE)
BENZOIN TINCTURE PRP APPL 2/3 (GAUZE/BANDAGES/DRESSINGS) ×2 IMPLANT
CABLE HIGH FREQUENCY MONO STRZ (ELECTRODE) ×2 IMPLANT
CANISTER SUCTION 2500CC (MISCELLANEOUS) ×2 IMPLANT
CATH REDDICK CHOLANGI 4FR 50CM (CATHETERS) ×2 IMPLANT
CLIP APPLIE 5 13 M/L LIGAMAX5 (MISCELLANEOUS) ×1 IMPLANT
CLIP APPLIE ROT 10 11.4 M/L (STAPLE) IMPLANT
CLOTH BEACON ORANGE TIMEOUT ST (SAFETY) ×2 IMPLANT
CLSR STERI-STRIP ANTIMIC 1/2X4 (GAUZE/BANDAGES/DRESSINGS) ×2 IMPLANT
COVER MAYO STAND STRL (DRAPES) ×2 IMPLANT
COVER SURGICAL LIGHT HANDLE (MISCELLANEOUS) ×2 IMPLANT
DECANTER SPIKE VIAL GLASS SM (MISCELLANEOUS) ×2 IMPLANT
DRAIN CHANNEL RND F F (WOUND CARE) ×2 IMPLANT
DRAPE C-ARM 42X72 X-RAY (DRAPES) ×2 IMPLANT
DRAPE LAPAROSCOPIC ABDOMINAL (DRAPES) ×2 IMPLANT
ELECT REM PT RETURN 9FT ADLT (ELECTROSURGICAL) ×2
ELECTRODE REM PT RTRN 9FT ADLT (ELECTROSURGICAL) ×1 IMPLANT
ENDOLOOP SUT PDS II  0 18 (SUTURE) ×2
ENDOLOOP SUT PDS II 0 18 (SUTURE) ×2 IMPLANT
EVACUATOR SILICONE 100CC (DRAIN) ×2 IMPLANT
GLOVE BIOGEL M 8.0 STRL (GLOVE) ×2 IMPLANT
GOWN STRL NON-REIN LRG LVL3 (GOWN DISPOSABLE) ×2 IMPLANT
GOWN STRL REIN XL XLG (GOWN DISPOSABLE) ×4 IMPLANT
HEMOSTAT SURGICEL 4X8 (HEMOSTASIS) IMPLANT
IV CATH 14GX2 1/4 (CATHETERS) ×2 IMPLANT
KIT BASIN OR (CUSTOM PROCEDURE TRAY) ×2 IMPLANT
NS IRRIG 1000ML POUR BTL (IV SOLUTION) ×2 IMPLANT
POUCH SPECIMEN RETRIEVAL 10MM (ENDOMECHANICALS) ×2 IMPLANT
SCISSORS LAP 5X35 DISP (ENDOMECHANICALS) ×2 IMPLANT
SET IRRIG TUBING LAPAROSCOPIC (IRRIGATION / IRRIGATOR) ×2 IMPLANT
SLEEVE Z-THREAD 5X100MM (TROCAR) IMPLANT
SOLUTION ANTI FOG 6CC (MISCELLANEOUS) ×2 IMPLANT
SPONGE GAUZE 4X4 12PLY (GAUZE/BANDAGES/DRESSINGS) ×2 IMPLANT
STRIP CLOSURE SKIN 1/2X4 (GAUZE/BANDAGES/DRESSINGS) ×2 IMPLANT
SUT ETHILON 3 0 PS 1 (SUTURE) ×2 IMPLANT
SUT VIC AB 4-0 SH 18 (SUTURE) ×2 IMPLANT
SYR 30ML LL (SYRINGE) ×2 IMPLANT
TAPE CLOTH SURG 4X10 WHT LF (GAUZE/BANDAGES/DRESSINGS) ×2 IMPLANT
TOWEL OR 17X26 10 PK STRL BLUE (TOWEL DISPOSABLE) ×4 IMPLANT
TRAY LAP CHOLE (CUSTOM PROCEDURE TRAY) ×2 IMPLANT
TROCAR BLADELESS OPT 5 75 (ENDOMECHANICALS) ×6 IMPLANT
TROCAR XCEL BLUNT TIP 100MML (ENDOMECHANICALS) ×2 IMPLANT
TROCAR XCEL NON-BLD 11X100MML (ENDOMECHANICALS) ×2 IMPLANT
TROCAR Z-THREAD FIOS 11X100 BL (TROCAR) ×2 IMPLANT
TROCAR Z-THREAD FIOS 5X100MM (TROCAR) ×2 IMPLANT
TUBING INSUFFLATION 10FT LAP (TUBING) ×2 IMPLANT

## 2012-05-18 NOTE — Anesthesia Preprocedure Evaluation (Signed)
Anesthesia Evaluation  Patient identified by MRN, date of birth, ID band Patient awake    Reviewed: Allergy & Precautions, H&P , NPO status , Patient's Chart, lab work & pertinent test results  Airway Mallampati: II TM Distance: <3 FB Neck ROM: Full    Dental No notable dental hx.    Pulmonary neg pulmonary ROS,  breath sounds clear to auscultation  Pulmonary exam normal       Cardiovascular negative cardio ROS  Rhythm:Regular Rate:Normal     Neuro/Psych negative neurological ROS  negative psych ROS   GI/Hepatic Neg liver ROS, GERD-  Medicated,  Endo/Other  negative endocrine ROS  Renal/GU negative Renal ROS  negative genitourinary   Musculoskeletal negative musculoskeletal ROS (+)   Abdominal   Peds negative pediatric ROS (+)  Hematology negative hematology ROS (+)   Anesthesia Other Findings   Reproductive/Obstetrics negative OB ROS                           Anesthesia Physical Anesthesia Plan  ASA: II  Anesthesia Plan: General   Post-op Pain Management:    Induction: Intravenous  Airway Management Planned: Oral ETT  Additional Equipment:   Intra-op Plan:   Post-operative Plan: Extubation in OR  Informed Consent: I have reviewed the patients History and Physical, chart, labs and discussed the procedure including the risks, benefits and alternatives for the proposed anesthesia with the patient or authorized representative who has indicated his/her understanding and acceptance.   Dental advisory given  Plan Discussed with: CRNA and Surgeon  Anesthesia Plan Comments:         Anesthesia Quick Evaluation  

## 2012-05-18 NOTE — Progress Notes (Signed)
Patient ID: Kevin Doyle, male   DOB: May 01, 1951, 61 y.o.   MRN: [id number] [facility] Surgery Progress Note:   * No surgery found *  Subjective: Mental status is clear.   Objective: Vital signs in last 24 hours: Temp:  [98.3 F (36.8 C)-100 F (37.8 C)] 99.8 F (37.7 C) (11/02 0602) Pulse Rate:  [67-102] 86  (11/02 0602) Resp:  [16-20] 18  (11/02 0602) BP: (122-165)/(71-90) 122/71 mmHg (11/02 0602) SpO2:  [92 %-100 %] 92 % (11/02 0602) Weight:  [204 lb (92.534 kg)] 204 lb (92.534 kg) (11/01 2006)  Intake/Output from previous day: 11/01 0701 - 11/02 0700 In: 1848.8 [P.O.:480; I.V.:1168.8; IV Piggyback:200] Out: 100 [Urine:100] Intake/Output this shift: Total I/O In: -  Out: 550 [Urine:550]  Physical Exam: Work of breathing is normal.  Complaining of midepigastric pain that radiates to RUQ.  Has had pain over the last month.  Lab Results:  Results for orders placed during the hospital encounter of 05/17/12 (from the past 48 hour(s))  CBC WITH DIFFERENTIAL     Status: Abnormal   Collection Time   05/17/12  4:10 PM      Component Value Range Comment   WBC 12.1 (*) 4.0 - 10.5 K/uL    RBC 4.29  4.22 - 5.81 MIL/uL    Hemoglobin 12.6 (*) 13.0 - 17.0 g/dL    HCT 14.7 (*) 82.9 - 52.0 %    MCV 85.5  78.0 - 100.0 fL    MCH 29.4  26.0 - 34.0 pg    MCHC 34.3  30.0 - 36.0 g/dL    RDW 56.2  13.0 - 86.5 %    Platelets 261  150 - 400 K/uL    Neutrophils Relative 90 (*) 43 - 77 %    Neutro Abs 10.9 (*) 1.7 - 7.7 K/uL    Lymphocytes Relative 6 (*) 12 - 46 %    Lymphs Abs 0.7  0.7 - 4.0 K/uL    Monocytes Relative 4  3 - 12 %    Monocytes Absolute 0.5  0.1 - 1.0 K/uL    Eosinophils Relative 0  0 - 5 %    Eosinophils Absolute 0.0  0.0 - 0.7 K/uL    Basophils Relative 0  0 - 1 %    Basophils Absolute 0.0  0.0 - 0.1 K/uL   COMPREHENSIVE METABOLIC PANEL     Status: Abnormal   Collection Time   05/17/12  4:10 PM      Component Value Range Comment   Sodium 139  135 - 145 mEq/L    Potassium 3.3 (*) 3.5 - 5.1 mEq/L    Chloride 102  96 - 112 mEq/L    CO2 25  19 - 32 mEq/L    Glucose, Bld 126 (*) 70 - 99 mg/dL    BUN 11  6 - 23 mg/dL    Creatinine, Ser 7.84  0.50 - 1.35 mg/dL    Calcium 8.7  8.4 - 69.6 mg/dL    Total Protein 7.1  6.0 - 8.3 g/dL    Albumin 3.6  3.5 - 5.2 g/dL    AST 17  0 - 37 U/L    ALT 10  0 - 53 U/L    Alkaline Phosphatase 104  39 - 117 U/L    Total Bilirubin 0.8  0.3 - 1.2 mg/dL    GFR calc non Af Amer >90  >90 mL/min    GFR calc Af Amer >90  >90 mL/min  LIPASE, BLOOD     Status: Normal   Collection Time   05/17/12  4:10 PM      Component Value Range Comment   Lipase 47  11 - 59 U/L   URINALYSIS, MICROSCOPIC ONLY     Status: Abnormal   Collection Time   05/17/12  4:19 PM      Component Value Range Comment   Color, Urine YELLOW  YELLOW    APPearance CLOUDY (*) CLEAR    Specific Gravity, Urine 1.024  1.005 - 1.030    pH 6.0  5.0 - 8.0    Glucose, UA NEGATIVE  NEGATIVE mg/dL    Hgb urine dipstick NEGATIVE  NEGATIVE    Bilirubin Urine NEGATIVE  NEGATIVE    Ketones, ur 40 (*) NEGATIVE mg/dL    Protein, ur NEGATIVE  NEGATIVE mg/dL    Urobilinogen, UA 0.2  0.0 - 1.0 mg/dL    Nitrite NEGATIVE  NEGATIVE    Leukocytes, UA NEGATIVE  NEGATIVE    WBC, UA 0-2  <3 WBC/hpf    Urine-Other MUCOUS PRESENT     SURGICAL PCR SCREEN     Status: Abnormal   Collection Time   05/17/12 11:02 PM      Component Value Range Comment   MRSA, PCR NEGATIVE  NEGATIVE    Staphylococcus aureus POSITIVE (*) NEGATIVE     Radiology/Results: No results found.  Anti-infectives: Anti-infectives     Start     Dose/Rate Route Frequency Ordered Stop   05/17/12 1800   ciprofloxacin (CIPRO) IVPB 400 mg        400 mg 200 mL/hr over 60 Minutes Intravenous Every 12 hours 05/17/12 1722            Assessment/Plan: Problem List: Patient Active Problem List  Diagnosis  . Symptomatic cholelithiasis    The question to answer is "Subacute cholecystitis or not?".  If  he has a thick walled GB with abscess walled off by omentum then he should probably undergo per drainage and then have open chole/nephrectomy at the same sitting.  A forced open chole at this time will delay and probably make subsequent nephrectomy more difficult.  Repeat GB ultrasound ordered.    * No surgery found *    LOS: 1 day   Matt B. Daphine Deutscher, MD, [facility] [phone] beeper [phone]  05/18/2012 8:46 AM

## 2012-05-18 NOTE — Anesthesia Postprocedure Evaluation (Signed)
  Anesthesia Post-op Note  Patient: Kevin Doyle  Procedure(s) Performed: Procedure(s) (LRB): LAPAROSCOPIC CHOLECYSTECTOMY WITH INTRAOPERATIVE CHOLANGIOGRAM (N/A)  Patient Location: PACU  Anesthesia Type: General  Level of Consciousness: awake and alert   Airway and Oxygen Therapy: Patient Spontanous Breathing  Post-op Pain: mild  Post-op Assessment: Post-op Vital signs reviewed, Patient's Cardiovascular Status Stable, Respiratory Function Stable, Patent Airway and No signs of Nausea or vomiting  Post-op Vital Signs: stable  Complications: No apparent anesthesia complications

## 2012-05-18 NOTE — Preoperative (Signed)
Beta Blockers   Reason not to administer Beta Blockers:Not Applicable 

## 2012-05-18 NOTE — Op Note (Signed)
Kevin Doyle @date @  Procedure: Laparoscopic Cholecystectomy with intraoperative cholangiogram  Surgeon: Wenda Low, MD, FACS Asst:  Ovidio Kin, MD, FACS  Anes:  General  Drains: None  Findings: Laparoscopic cholecystectomy with intraoperative cholangiogram  Description of Procedure: The patient was taken to OR 6 on Saturday, November 2 and given general anesthesia.  The patient was prepped with PCMX and draped sterilely. A time out was performed.  Access to the abdomen was achieved with a Hassan cannula through the umbilicus without difficulty.  Port placement included two 5 mm laterally and an 11 in the upper midline.  The gallbladder was stuck to the anterior abdominal wall and was markedly inflamed and there was purulence surrounding it. I stripped that away  The gallbladder was visualized and the fundus was grasped and the gallbladder was elevated. Traction on the infundibulum allowed for successful demonstration of the critical view. Inflammatory changes were severe and markedly inflamed. I decompress the gallbladder and did contain pus. An incision was made in the cystic and the Reddick catheter was insertedter milking the cystic duct of any debris. A dynamic cholangiogram was performed which demonstrated patent intrahepatic radicals and flow into the duodenum. I felt like L. Is likely more in the distal gallbladder but the degree of inflammation made it really impossible to dissect the cystic duct safely. The cystic duct was ligated with 2 Endoloops of PDS.  Thecystic artery was double clipped and divided and then the gallbladder was removed from the gallbladder bed. Removal of the gallbladder from the gallbladder bed was bloody but in the end hemostasis was achieved`d brought out through one of the 10 mm trocar sites. The gallbladder bed was inspected and no bleeding or bile leaks were seen.   Laparoscopic visualization was used when closing fascial defects for trocar sites.   Incisions  were  closed with 4-0 Vicryl and benzoin and Steri-Strip on the skin. Sponge and needle counts were correct. Prior to closure a 19 Blake drain was placed in the gallbladder bed and brought to the lateral incision and secured with a 4-0 nylon.  The patient was taken to the recovery room in satisfactory condition.

## 2012-05-18 NOTE — Transfer of Care (Signed)
Immediate Anesthesia Transfer of Care Note  Patient: Kevin Doyle  Procedure(s) Performed: Procedure(s) (LRB) with comments: LAPAROSCOPIC CHOLECYSTECTOMY WITH INTRAOPERATIVE CHOLANGIOGRAM (N/A)  Patient Location: PACU  Anesthesia Type:General  Level of Consciousness: awake, alert , oriented and patient cooperative  Airway & Oxygen Therapy: Patient Spontanous Breathing and Patient connected to face mask oxygen  Post-op Assessment: Report given to PACU RN, Post -op Vital signs reviewed and stable and Patient moving all extremities  Post vital signs: Reviewed and stable  Complications: No apparent anesthesia complications

## 2012-05-19 DIAGNOSIS — N2889 Other specified disorders of kidney and ureter: Secondary | ICD-10-CM | POA: Diagnosis present

## 2012-05-19 MED ORDER — OXYCODONE-ACETAMINOPHEN 5-325 MG PO TABS: 1.0000 | ORAL_TABLET | ORAL | Status: DC | PRN

## 2012-05-19 NOTE — Discharge Summary (Signed)
Physician Discharge Summary  Patient ID: Kevin Doyle MRN: [id number] DOB/AGE: Sep 25, 1950 61 y.o.  Admit date: 05/17/2012 Discharge date: 05/19/2012  Admission Diagnoses:  cholecystitis  Discharge Diagnoses:  Same; post lap chole  Active Problems:  Renal mass, right   Surgery:  Laparoscopic cholecystectomy with IOC  Discharged Condition: improved  Hospital Course:   Admitted; repeat ultrasound;  Lap chole;  JP left in .  Discharge on PD 1  Consults: none  Significant Diagnostic Studies: ultrasound, IOC    Discharge Exam: Blood pressure 110/57, pulse 68, temperature 97.9 F (36.6 C), temperature source Oral, resp. rate 16, height 6\' 2"  (1.88 m), weight 204 lb (92.534 kg), SpO2 94.00%. Incisions OK.  JP in place and serosanguinous  Disposition:   Discharge Orders    Future Orders Please Complete By Expires   Diet general      Scheduling Instructions:   Advance as tolerated   Increase activity slowly      Discharge instructions      Comments:   Nursing to instruct on JP management   Discharge wound care:      Comments:   Nursing to instruct in JP management.  May shower and shampoo       Medication List     As of 05/19/2012 11:53 AM    STOP taking these medications         hyoscyamine 0.125 MG tablet   Commonly known as: LEVSIN, ANASPAZ      omeprazole 20 MG tablet   Commonly known as: PRILOSEC OTC      traMADol 50 MG tablet   Commonly known as: ULTRAM      TAKE these medications         aspirin EC 81 MG tablet   Take 81 mg by mouth daily.      multivitamin with minerals Tabs   Take 1 tablet by mouth daily.      oxyCODONE-acetaminophen 5-325 MG per tablet   Commonly known as: PERCOCET/ROXICET   Take 1 tablet by mouth every 4 (four) hours as needed for pain.           Follow-up Information    Follow up with Luretha Murphy B, MD. Call in 4 days. (Call and see me on Thursday)    Contact information:   [address] [place]  [address] [phone]          Signed: Valarie Merino 05/19/2012, 11:53 AM

## 2012-05-19 NOTE — Progress Notes (Signed)
Discharge instructions reviewed with patient and spouse, vital signs are within normal limits, incisions within normal limits, wife and patient educated on JP drain care, patient to follow up with MD on this following Thursday, no complaints of nausea or vomiting, will continue to monitor Means, Myrtie Hawk RN 05-19-2012

## 2012-05-20 ENCOUNTER — Encounter (HOSPITAL_COMMUNITY): Payer: Self-pay | Admitting: Surgery

## 2012-05-20 NOTE — Consult Note (Signed)
I was present for the history and physical.  We offered inpatient stay and cholecystectomy.  He will be able to have the nephrectomy later at his convenience since he continues to have symptoms.

## 2012-05-23 ENCOUNTER — Encounter (INDEPENDENT_AMBULATORY_CARE_PROVIDER_SITE_OTHER): Payer: Self-pay | Admitting: Surgery

## 2012-05-23 ENCOUNTER — Ambulatory Visit (INDEPENDENT_AMBULATORY_CARE_PROVIDER_SITE_OTHER): Payer: Managed Care, Other (non HMO) | Admitting: Surgery

## 2012-05-23 VITALS — BP 140/90 | HR 88 | Temp 97.1°F | Resp 16 | Ht 74.0 in | Wt 207.0 lb

## 2012-05-23 DIAGNOSIS — Z9889 Other specified postprocedural states: Secondary | ICD-10-CM

## 2012-05-23 DIAGNOSIS — Z9049 Acquired absence of other specified parts of digestive tract: Secondary | ICD-10-CM | POA: Insufficient documentation

## 2012-05-23 NOTE — Progress Notes (Signed)
Kellie Moor 61 y.o.  Body mass index is 26.58 kg/(m^2).  Patient Active Problem List  Diagnosis  . Renal mass, right  . S/P laparoscopic cholecystectomy Nove 2013    No Known Allergies  Past Surgical History  Procedure Date  . Hernia repairs     x 3   . Colonoscopy   . Upper gastrointestinal endoscopy   . Inguinal hernia repair 1986  . Cholecystectomy 05/18/2012    Procedure: LAPAROSCOPIC CHOLECYSTECTOMY WITH INTRAOPERATIVE CHOLANGIOGRAM;  Surgeon: Valarie Merino, MD;  Location: [facility];  Service: General;  Laterality: N/A;   Rudi Heap, MD 1. S/P laparoscopic cholecystectomy Nove 2013     Doing well postop.  JP serous.  Removed.  Path report showed angry cholecystitis. Return. 4 weeks. Matt B. Daphine Deutscher, MD, [facility] [phone] beeper [phone]  05/23/2012 9:37 AM

## 2012-05-23 NOTE — Patient Instructions (Signed)
May return to work on Monday, Nov 11

## 2012-05-24 NOTE — Care Management Note (Signed)
    Page 1 of 1   05/24/2012     8:28:21 AM   CARE MANAGEMENT NOTE 05/24/2012  Patient:  Kevin Doyle,Kevin Doyle   Account Number:  [id number]  Date Initiated:  05/21/2012  Documentation initiated by:  Lorenda Ishihara  Subjective/Objective Assessment:     Action/Plan:   Anticipated DC Date:  05/19/2012   Anticipated DC Plan:  HOME/SELF CARE         Choice offered to / List presented to:             Status of service:  Completed, signed off Medicare Important Message given?   (If response is "NO", the following Medicare IM given date fields will be blank) Date Medicare IM given:   Date Additional Medicare IM given:    Discharge Disposition:  HOME/SELF CARE  Per UR Regulation:  Reviewed for med. necessity/level of care/duration of stay  If discussed at Long Length of Stay Meetings, dates discussed:    Comments:

## 2012-06-11 ENCOUNTER — Encounter (HOSPITAL_COMMUNITY): Payer: Self-pay | Admitting: Pharmacy Technician

## 2012-06-11 ENCOUNTER — Encounter (INDEPENDENT_AMBULATORY_CARE_PROVIDER_SITE_OTHER): Payer: Managed Care, Other (non HMO) | Admitting: Surgery

## 2012-06-19 ENCOUNTER — Encounter (HOSPITAL_COMMUNITY)
Admission: RE | Admit: 2012-06-19 | Discharge: 2012-06-19 | Disposition: A | Discharge status: Home / Self Care | Payer: Managed Care, Other (non HMO) | Source: Ambulatory Visit | Attending: Urology | Admitting: Urology

## 2012-06-19 ENCOUNTER — Encounter (HOSPITAL_COMMUNITY): Payer: Self-pay

## 2012-06-19 LAB — BASIC METABOLIC PANEL
BUN: 14 mg/dL (ref 6–23)
Calcium: 9 mg/dL (ref 8.4–10.5)
Creatinine, Ser: 0.78 mg/dL (ref 0.50–1.35)
GFR calc Af Amer: >90 mL/min (ref >90)
GFR calc non Af Amer: >90 mL/min (ref >90)

## 2012-06-19 LAB — SURGICAL PCR SCREEN
MRSA, PCR: NEGATIVE
Staphylococcus aureus: NEGATIVE

## 2012-06-19 LAB — CBC
MCHC: 34.1 g/dL (ref 30.0–36.0)
Platelets: 221 10*3/uL (ref 150–400)
RDW: 13 % (ref 11.5–15.5)
WBC: 5.6 10*3/uL (ref 4.0–10.5)

## 2012-06-19 NOTE — Patient Instructions (Signed)
20      Your procedure is scheduled on:  Monday 06/24/2012 at 1100 am  Report to [facility] at 0830  AM.  Call this number if you have problems the morning of surgery: [phone]   Remember: REMEMBER TO FOLLOW BOWEL PREP INSTRUCTIONS FROM DR.BORDEN'S OFFICE AND FOLLOW CLEAR LIQUID DIET ON Sunday 06/23/2012 ALL DAY!   Do not eat food or drink liquids after midnight!  Take these medicines the morning of surgery with A SIP OF WATER: NONE   Do not bring valuables to the hospital.  .  Leave suitcase in the car. After surgery it may be brought to your room.  For patients admitted to the hospital, checkout time is 11:00 AM the day of              Discharge.    Special Instructions: See [facility] Preparing  For Surgery Instruction Sheet.Do not wear jewelry, lotions powders, perfumes. Women do not shave legs or underarms for 12 hours before showers. Contacts, partial plates,              or dentures may not be worn into surgery.                          Patients discharged the day of surgery will not be allowed to drive home.If going home the same day of surgery, must have someone stay with you first 24 hrs.at home and arrange for someone to drive you home from the              Hospital. YOUR DRIVER IS:   Please read over the following fact sheets that you were given: MRSA INFORMATION,INCENTIVE SPIROMETRY SHEET, SLEEP APNEA SHEET, BLOOD TRANSFUSION SHEET                             M.,RN,BSN     [phone]

## 2012-06-24 ENCOUNTER — Encounter (HOSPITAL_COMMUNITY): Payer: Self-pay | Admitting: Anesthesiology

## 2012-06-24 ENCOUNTER — Inpatient Hospital Stay (HOSPITAL_COMMUNITY)
Admission: RE | Admit: 2012-06-24 | Discharge: 2012-06-26 | DRG: 658 | Disposition: A | Discharge status: Home / Self Care | Payer: Managed Care, Other (non HMO) | Source: Ambulatory Visit | Attending: Urology | Admitting: Urology

## 2012-06-24 ENCOUNTER — Inpatient Hospital Stay (HOSPITAL_COMMUNITY): Payer: Managed Care, Other (non HMO) | Admitting: Anesthesiology

## 2012-06-24 ENCOUNTER — Encounter (HOSPITAL_COMMUNITY)
Admission: RE | Disposition: A | Discharge status: Home / Self Care | Payer: Self-pay | Source: Ambulatory Visit | Attending: Urology

## 2012-06-24 ENCOUNTER — Encounter (HOSPITAL_COMMUNITY): Payer: Self-pay | Admitting: *Deleted

## 2012-06-24 DIAGNOSIS — Z01812 Encounter for preprocedural laboratory examination: Secondary | ICD-10-CM

## 2012-06-24 DIAGNOSIS — K219 Gastro-esophageal reflux disease without esophagitis: Secondary | ICD-10-CM | POA: Diagnosis present

## 2012-06-24 DIAGNOSIS — Z9089 Acquired absence of other organs: Secondary | ICD-10-CM

## 2012-06-24 DIAGNOSIS — D3000 Benign neoplasm of unspecified kidney: Principal | ICD-10-CM | POA: Diagnosis present

## 2012-06-24 DIAGNOSIS — K660 Peritoneal adhesions (postprocedural) (postinfection): Secondary | ICD-10-CM | POA: Diagnosis present

## 2012-06-24 HISTORY — PX: LAPAROSCOPIC LYSIS OF ADHESIONS: SHX5905

## 2012-06-24 HISTORY — PX: ROBOTIC ASSITED PARTIAL NEPHRECTOMY: SHX6087

## 2012-06-24 LAB — BASIC METABOLIC PANEL
CO2: 26 mEq/L (ref 19–32)
Calcium: 8 mg/dL — ABNORMAL LOW (ref 8.4–10.5)
Chloride: 105 mEq/L (ref 96–112)
Creatinine, Ser: 1.05 mg/dL (ref 0.50–1.35)
Glucose, Bld: 150 mg/dL — ABNORMAL HIGH (ref 70–99)
Sodium: 137 mEq/L (ref 135–145)

## 2012-06-24 LAB — TYPE AND SCREEN: Antibody Screen: NEGATIVE

## 2012-06-24 LAB — HEMOGLOBIN AND HEMATOCRIT, BLOOD: HCT: 32.3 % — ABNORMAL LOW (ref 39.0–52.0)

## 2012-06-24 SURGERY — ROBOTIC ASSITED PARTIAL NEPHRECTOMY
Anesthesia: General | Site: Abdomen | Laterality: Right | Wound class: Clean

## 2012-06-24 MED ORDER — SUFENTANIL CITRATE 50 MCG/ML IV SOLN
INTRAVENOUS | Status: DC | PRN
  Administered 2012-06-24 (×2): 5 ug via INTRAVENOUS
  Administered 2012-06-24: 10 ug via INTRAVENOUS
  Administered 2012-06-24: 5 ug via INTRAVENOUS
  Administered 2012-06-24: 10 ug via INTRAVENOUS
  Administered 2012-06-24 (×3): 5 ug via INTRAVENOUS

## 2012-06-24 MED ORDER — HYDROMORPHONE HCL PF 1 MG/ML IJ SOLN
INTRAMUSCULAR | Status: AC
  Filled 2012-06-24: qty 1

## 2012-06-24 MED ORDER — BUPIVACAINE LIPOSOME 1.3 % IJ SUSP
20.0000 mL | Freq: Once | INTRAMUSCULAR | Status: AC
  Administered 2012-06-24: 20 mL
  Filled 2012-06-24 (×2): qty 20

## 2012-06-24 MED ORDER — MANNITOL 25 % IV SOLN
25.0000 g | Freq: Once | INTRAVENOUS | Status: AC
  Administered 2012-06-24 (×2): 12.5 g via INTRAVENOUS

## 2012-06-24 MED ORDER — DEXTROSE-NACL 5-0.45 % IV SOLN
INTRAVENOUS | Status: DC
  Administered 2012-06-24: 150 mL/h via INTRAVENOUS
  Administered 2012-06-25: 08:00:00 via INTRAVENOUS

## 2012-06-24 MED ORDER — CEFAZOLIN SODIUM-DEXTROSE 2-3 GM-% IV SOLR
2.0000 g | INTRAVENOUS | Status: AC
  Administered 2012-06-24: 2 g via INTRAVENOUS

## 2012-06-24 MED ORDER — HYDROMORPHONE HCL PF 1 MG/ML IJ SOLN
0.2500 mg | INTRAMUSCULAR | Status: DC | PRN
  Administered 2012-06-24: 0.5 mg via INTRAVENOUS
  Administered 2012-06-24 (×2): 0.25 mg via INTRAVENOUS

## 2012-06-24 MED ORDER — STERILE WATER FOR IRRIGATION IR SOLN
Status: DC | PRN
  Administered 2012-06-24: 3000 mL

## 2012-06-24 MED ORDER — FENTANYL CITRATE 0.05 MG/ML IJ SOLN
INTRAMUSCULAR | Status: DC | PRN
  Administered 2012-06-24: 50 ug via INTRAVENOUS

## 2012-06-24 MED ORDER — METOCLOPRAMIDE HCL 5 MG/ML IJ SOLN
INTRAMUSCULAR | Status: DC | PRN
  Administered 2012-06-24: 10 mg via INTRAVENOUS

## 2012-06-24 MED ORDER — GLYCOPYRROLATE 0.2 MG/ML IJ SOLN
INTRAMUSCULAR | Status: DC | PRN
  Administered 2012-06-24: .8 mg via INTRAVENOUS

## 2012-06-24 MED ORDER — ONDANSETRON HCL 4 MG/2ML IJ SOLN: 4.0000 mg | INTRAMUSCULAR | Status: DC | PRN

## 2012-06-24 MED ORDER — ZOLPIDEM TARTRATE 5 MG PO TABS: 5.0000 mg | ORAL_TABLET | Freq: Every evening | ORAL | Status: DC | PRN

## 2012-06-24 MED ORDER — CISATRACURIUM BESYLATE (PF) 10 MG/5ML IV SOLN
INTRAVENOUS | Status: DC | PRN
  Administered 2012-06-24: 1 mg via INTRAVENOUS
  Administered 2012-06-24: 2 mg via INTRAVENOUS

## 2012-06-24 MED ORDER — LACTATED RINGERS IR SOLN
Status: DC | PRN
  Administered 2012-06-24: 1000 mL

## 2012-06-24 MED ORDER — HETASTARCH-ELECTROLYTES 6 % IV SOLN
INTRAVENOUS | Status: DC | PRN
  Administered 2012-06-24: 14:00:00 via INTRAVENOUS

## 2012-06-24 MED ORDER — LACTATED RINGERS IV SOLN
INTRAVENOUS | Status: DC
  Administered 2012-06-24: 1000 mL via INTRAVENOUS
  Administered 2012-06-24 (×3): via INTRAVENOUS

## 2012-06-24 MED ORDER — DIPHENHYDRAMINE HCL 50 MG/ML IJ SOLN: 12.5000 mg | Freq: Four times a day (QID) | INTRAMUSCULAR | Status: DC | PRN

## 2012-06-24 MED ORDER — ACETAMINOPHEN 10 MG/ML IV SOLN
INTRAVENOUS | Status: DC | PRN
  Administered 2012-06-24: 1000 mg via INTRAVENOUS

## 2012-06-24 MED ORDER — HYDROMORPHONE HCL PF 1 MG/ML IJ SOLN
INTRAMUSCULAR | Status: DC | PRN
  Administered 2012-06-24: .5 mg via INTRAVENOUS
  Administered 2012-06-24: 0.5 mg via INTRAVENOUS
  Administered 2012-06-24 (×2): .5 mg via INTRAVENOUS

## 2012-06-24 MED ORDER — MIDAZOLAM HCL 5 MG/5ML IJ SOLN
INTRAMUSCULAR | Status: DC | PRN
  Administered 2012-06-24: 2 mg via INTRAVENOUS

## 2012-06-24 MED ORDER — ACETAMINOPHEN 10 MG/ML IV SOLN
1000.0000 mg | Freq: Four times a day (QID) | INTRAVENOUS | Status: AC
  Administered 2012-06-24 – 2012-06-25 (×4): 1000 mg via INTRAVENOUS
  Filled 2012-06-24 (×5): qty 100

## 2012-06-24 MED ORDER — ROCURONIUM BROMIDE 100 MG/10ML IV SOLN
INTRAVENOUS | Status: DC | PRN
  Administered 2012-06-24: 50 mg via INTRAVENOUS

## 2012-06-24 MED ORDER — MANNITOL 25 % IV SOLN
25.0000 g | Freq: Once | INTRAVENOUS | Status: DC
  Filled 2012-06-24: qty 100

## 2012-06-24 MED ORDER — PROMETHAZINE HCL 25 MG/ML IJ SOLN: 6.2500 mg | INTRAMUSCULAR | Status: DC | PRN

## 2012-06-24 MED ORDER — NEOSTIGMINE METHYLSULFATE 1 MG/ML IJ SOLN
INTRAMUSCULAR | Status: DC | PRN
  Administered 2012-06-24: 5 mg via INTRAVENOUS

## 2012-06-24 MED ORDER — EPHEDRINE SULFATE 50 MG/ML IJ SOLN
INTRAMUSCULAR | Status: DC | PRN
  Administered 2012-06-24: 10 mg via INTRAVENOUS

## 2012-06-24 MED ORDER — MORPHINE SULFATE 2 MG/ML IJ SOLN
2.0000 mg | INTRAMUSCULAR | Status: DC | PRN
  Administered 2012-06-25: 2 mg via INTRAVENOUS
  Filled 2012-06-24: qty 1

## 2012-06-24 MED ORDER — DEXAMETHASONE SODIUM PHOSPHATE 10 MG/ML IJ SOLN
INTRAMUSCULAR | Status: DC | PRN
  Administered 2012-06-24: 10 mg via INTRAVENOUS

## 2012-06-24 MED ORDER — PROPOFOL 10 MG/ML IV BOLUS
INTRAVENOUS | Status: DC | PRN
  Administered 2012-06-24: 160 mg via INTRAVENOUS

## 2012-06-24 MED ORDER — DOCUSATE SODIUM 100 MG PO CAPS
100.0000 mg | ORAL_CAPSULE | Freq: Two times a day (BID) | ORAL | Status: DC
  Administered 2012-06-25 – 2012-06-26 (×3): 100 mg via ORAL
  Filled 2012-06-24 (×5): qty 1

## 2012-06-24 MED ORDER — CEFAZOLIN SODIUM 1-5 GM-% IV SOLN
1.0000 g | Freq: Three times a day (TID) | INTRAVENOUS | Status: AC
  Administered 2012-06-24 – 2012-06-25 (×2): 1 g via INTRAVENOUS
  Filled 2012-06-24 (×2): qty 50

## 2012-06-24 MED ORDER — ONDANSETRON HCL 4 MG/2ML IJ SOLN
INTRAMUSCULAR | Status: DC | PRN
  Administered 2012-06-24 (×2): 4 mg via INTRAVENOUS

## 2012-06-24 MED ORDER — LIDOCAINE HCL (CARDIAC) 20 MG/ML IV SOLN
INTRAVENOUS | Status: DC | PRN
  Administered 2012-06-24: 80 mg via INTRAVENOUS

## 2012-06-24 MED ORDER — DIPHENHYDRAMINE HCL 12.5 MG/5ML PO ELIX: 12.5000 mg | ORAL_SOLUTION | Freq: Four times a day (QID) | ORAL | Status: DC | PRN

## 2012-06-24 SURGICAL SUPPLY — 60 items
APPLICATOR SURGIFLO ENDO (HEMOSTASIS) ×3 IMPLANT
CANNULA SEAL DVNC (CANNULA) IMPLANT
CANNULA SEALS DA VINCI (CANNULA)
CHLORAPREP W/TINT 26ML (MISCELLANEOUS) ×3 IMPLANT
CLIP LIGATING HEM O LOK PURPLE (MISCELLANEOUS) ×3 IMPLANT
CLIP LIGATING HEMO O LOK GREEN (MISCELLANEOUS) ×6 IMPLANT
CLOTH BEACON ORANGE TIMEOUT ST (SAFETY) ×3 IMPLANT
CORDS BIPOLAR (ELECTRODE) ×3 IMPLANT
COVER SURGICAL LIGHT HANDLE (MISCELLANEOUS) ×3 IMPLANT
COVER TIP SHEARS 8 DVNC (MISCELLANEOUS) ×2 IMPLANT
COVER TIP SHEARS 8MM DA VINCI (MISCELLANEOUS) ×1
DECANTER SPIKE VIAL GLASS SM (MISCELLANEOUS) IMPLANT
DERMABOND ADVANCED (GAUZE/BANDAGES/DRESSINGS) ×1
DERMABOND ADVANCED .7 DNX12 (GAUZE/BANDAGES/DRESSINGS) ×2 IMPLANT
DRAIN CHANNEL 15F RND FF 3/16 (WOUND CARE) ×3 IMPLANT
DRAPE INCISE IOBAN 66X45 STRL (DRAPES) ×3 IMPLANT
DRAPE LAPAROSCOPIC ABDOMINAL (DRAPES) ×3 IMPLANT
DRAPE LG THREE QUARTER DISP (DRAPES) ×6 IMPLANT
DRAPE TABLE BACK 44X90 PK DISP (DRAPES) ×3 IMPLANT
DRAPE WARM FLUID 44X44 (DRAPE) ×3 IMPLANT
DRESSING SURGICEL FIBRLLR 1X2 (HEMOSTASIS) IMPLANT
DRSG SURGICEL FIBRILLAR 1X2 (HEMOSTASIS)
ELECT REM PT RETURN 9FT ADLT (ELECTROSURGICAL) ×6
ELECTRODE REM PT RTRN 9FT ADLT (ELECTROSURGICAL) ×4 IMPLANT
EVACUATOR SILICONE 100CC (DRAIN) ×3 IMPLANT
GAUZE VASELINE 3X9 (GAUZE/BANDAGES/DRESSINGS) IMPLANT
GLOVE BIO SURGEON STRL SZ 6.5 (GLOVE) ×3 IMPLANT
GLOVE BIOGEL M STRL SZ7.5 (GLOVE) ×6 IMPLANT
GOWN STRL NON-REIN LRG LVL3 (GOWN DISPOSABLE) ×15 IMPLANT
GOWN STRL REIN XL XLG (GOWN DISPOSABLE) ×3 IMPLANT
HEMOSTAT SURGICEL 4X8 (HEMOSTASIS) ×3 IMPLANT
KIT ACCESSORY DA VINCI DISP (KITS) ×1
KIT ACCESSORY DVNC DISP (KITS) ×2 IMPLANT
KIT BASIN OR (CUSTOM PROCEDURE TRAY) ×3 IMPLANT
NS IRRIG 1000ML POUR BTL (IV SOLUTION) IMPLANT
PENCIL BUTTON HOLSTER BLD 10FT (ELECTRODE) ×3 IMPLANT
POSITIONER SURGICAL ARM (MISCELLANEOUS) IMPLANT
POUCH SPECIMEN RETRIEVAL 10MM (ENDOMECHANICALS) ×3 IMPLANT
SET TUBE IRRIG SUCTION NO TIP (IRRIGATION / IRRIGATOR) ×3 IMPLANT
SOLUTION ANTI FOG 6CC (MISCELLANEOUS) ×3 IMPLANT
SOLUTION ELECTROLUBE (MISCELLANEOUS) ×3 IMPLANT
SPONGE LAP 18X18 X RAY DECT (DISPOSABLE) IMPLANT
SURGIFLO W/THROMBIN 8M KIT (HEMOSTASIS) ×3 IMPLANT
SUT ETHILON 3 0 PS 1 (SUTURE) ×3 IMPLANT
SUT MNCRL AB 4-0 PS2 18 (SUTURE) ×6 IMPLANT
SUT V-LOC BARB 180 2/0GR6 GS22 (SUTURE) ×3
SUT VIC AB 0 CT1 27 (SUTURE) ×1
SUT VIC AB 0 CT1 27XBRD ANTBC (SUTURE) ×2 IMPLANT
SUT VICRYL 0 UR6 27IN ABS (SUTURE) ×6 IMPLANT
SUT VLOC BARB 180 ABS3/0GR12 (SUTURE) ×6
SUTURE V-LC BRB 180 2/0GR6GS22 (SUTURE) ×2 IMPLANT
SUTURE VLOC BRB 180 ABS3/0GR12 (SUTURE) ×4 IMPLANT
SYR BULB IRRIGATION 50ML (SYRINGE) IMPLANT
TOWEL OR NON WOVEN STRL DISP B (DISPOSABLE) ×3 IMPLANT
TRAY FOLEY CATH 14FRSI W/METER (CATHETERS) ×3 IMPLANT
TRAY LAP CHOLE (CUSTOM PROCEDURE TRAY) ×3 IMPLANT
TROCAR ENDOPATH XCEL 12X100 BL (ENDOMECHANICALS) ×3 IMPLANT
TROCAR XCEL 12X100 BLDLESS (ENDOMECHANICALS) ×3 IMPLANT
TUBING INSUFFLATION 10FT LAP (TUBING) IMPLANT
WATER STERILE IRR 1500ML POUR (IV SOLUTION) IMPLANT

## 2012-06-24 NOTE — Anesthesia Postprocedure Evaluation (Signed)
  Anesthesia Post-op Note  Patient: Kevin Doyle  Procedure(s) Performed: Procedure(s) (LRB): ROBOTIC ASSITED PARTIAL NEPHRECTOMY (Right) LAPAROSCOPIC LYSIS OF ADHESIONS ()  Patient Location: PACU  Anesthesia Type: General  Level of Consciousness: awake and alert   Airway and Oxygen Therapy: Patient Spontanous Breathing  Post-op Pain: mild  Post-op Assessment: Post-op Vital signs reviewed, Patient's Cardiovascular Status Stable, Respiratory Function Stable, Patent Airway and No signs of Nausea or vomiting  Last Vitals:  Filed Vitals:   06/24/12 1545  BP: 125/60  Pulse: 71  Temp: 36.6 C  Resp: 13    Post-op Vital Signs: stable   Complications: No apparent anesthesia complications

## 2012-06-24 NOTE — Op Note (Signed)
Preoperative diagnosis: Right renal neoplasm  Postoperative diagnosis: Right renal neoplasm  Procedure:  1. Right robotic-assisted laparoscopic partial nephrectomy 2. Laparoscopic adhesiolysis  Surgeon: Moody Bruins. M.D.  Assistant(s): Pecola Leisure, PA-C  Anesthesia: General  Complications: None  EBL: 600 mL  IVF:  3400 mL crystalloid, 500 mL colloid  Specimens: 1. Right renal neoplasm  Disposition of specimens: Pathology  Intraoperative findings:       1. Warm renal ischemia time: 23 minutes  Drains: 1. # 15 Blake perinephric drain  Indication:  Kevin Doyle is a 61 y.o. year old patient with a right renal neoplasm.  After a thorough review of the management options for their renal mass, they elected to proceed with surgical treatment and the above procedure.  We have discussed the potential benefits and risks of the procedure, side effects of the proposed treatment, the likelihood of the patient achieving the goals of the procedure, and any potential problems that might occur during the procedure or recuperation. Informed consent has been obtained.   Description of procedure:  The patient was taken to the operating room and a general anesthetic was administered. The patient was given preoperative antibiotics, placed in the right modified flank position with care to pad all potential pressure points, and prepped and draped in the usual sterile fashion. Next a preoperative timeout was performed.  A site was selected on the right side of the umbilicus for placement of the camera port. This was placed using a standard open Hassan technique which allowed entry into the peritoneal cavity under direct vision and without difficulty. A 12 mm port was placed and a pneumoperitoneum established. The camera was then used to inspect the abdomen and there was no evidence of any intra-abdominal injuries.  He did have extensive intraperitoneal adhesions between the liver, bowel,  and abdominal wall presumably related to his cholecystectomy a few weeks ago. The remaining abdominal ports were then placed. 8 mm robotic ports were placed in the right upper quadrant, right lower quadrant, and far right lateral abdominal wall. A 12 mm port was placed in the upper midline for laparoscopic assistance. All ports were placed under direct vision without difficulty. The surgical cart was then docked.   The previously mentioned adhesions were then taken down with scissor dissection.  This allowed the liver to be freely mobilized superiorly and the retroperitoneum exposed for dissection.  The total time of the adhesiolysis was 45 minutes with the total operative time approximately 2.5 hours. Utilizing the cautery scissors, the white line of Toldt was incised allowing the colon to be mobilized medially and the plane between the mesocolon and the anterior layer of Gerota's fascia to be developed and the kidney to be exposed.  The ureter and gonadal vein were identified inferiorly and the ureter was lifted anteriorly off the psoas muscle.  Dissection proceeded superiorly along the gonadal vein until the renal vein was identified.  The renal hilum was then carefully isolated with a combination of blunt and sharp dissection allowing the renal arterial and venous structures to be separated and isolated in preparation for renal hilar vessel clamping. There was a single branching renal artery and main renal vein and a small lower pole renal vein.  12.5 g of IV mannitol was then administered.   Attention turned to the kidney and the perinephric fat surrounding the renal mass was removed and the kidney was mobilized sufficiently for exposure and resection of the renal mass.  There was significant neovascularity with multiple large  tumor veins noted between the tumor and surrounding tissue.  Once the renal mass was properly isolated, preparations were made for resection of the tumor.  Reconstructive sutures  were placed into the abdomen for the renorrhaphy portion of the procedure.  The renal artery was then clamped with bulldog clamps.  The tumor was then excised with cold scissor dissection along with an adequate visible gross margin of normal renal parenchyma. The tumor appeared to be excised without any gross violation of the tumor. The renal collecting system was not entered during removal of the tumor.  A running 3-0 V-lock suture was then brought through the capsule of the kidney and run along the base of the renal defect to provide hemostasis and close any entry into the renal collecting system if present. Weck clips were used to secure this suture outside the renal capsule at the proximal and distal ends. An additional hemostatic agent (Surgiflo) was then placed into the renal defect. A running 2-0 V lock suture was then used to close the capsule of the kidney using a sliding clip technique which resulted in excellent hemostasis.  A large piece of surgicel was also placed over the kidney defect and the upper pole of the kidney where the tumor neovascularity was prominent.  The bulldog clamps were then removed from the renal hilar vessel(s) and an additional 12.5 g of IV mannitol was administered. Total warm renal ischemia time was 23 minutes. The renal tumor resection site was examined. Hemostasis appeared adequate.   The kidney was placed back into its normal anatomic position and covered with perinephric fat as needed.  A # 15 Blake drain was then brought through the lateral lower port site and positioned in the perinephric space.  It was secured to the skin with a nylon suture. The surgical cart was undocked.  The renal tumor specimen was removed intact within an endopouch retrieval bag via the camera port sites.  The camera port site and the other 12 mm port site were then closed at the fascial level with 0-vicryl suture.  All other laparoscopic/robotic ports were removed under direct vision and the  pneumoperitoneum let down with inspection of the operative field performed and hemostasis again confirmed. All incision sites were then injected with local anesthetic and reapproximated at the skin level with 4-0 monocryl subcuticular closures.  Dermabond was applied to the skin.  The patient tolerated the procedure well and without complications.  The patient was able to be extubated and transferred to the recovery unit in satisfactory condition.  Moody Bruins MD

## 2012-06-24 NOTE — H&P (Signed)
Chief Complaint  Right renal neoplasm     History of Present Illness  Mr. Kevin Doyle is a 61 year old who has a history of intermittent epigatric pain and was evaluated by Dr. Sheryn Bison.  He underwent an abdominal ultrasound which confirmed cholelithiasis raising the possibility that his symptoms may be related to symptomatic cholelithiasis.  He has seen Dr. Avel Peace for evaluation and consideration of surgical treatment. Dr. Abbey Chatters apparently did recommend that he proceed with a laparoscopic cholecystectomy.  Incidentally, on his ultrasound imaging he was noted to have solid appearing mass off the upper pole of the right kidney as well.  He was seen by Dr. 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  There is no evidence of regional lymphadenopathy or renal vein/IVC involvement.  There are no adrenal lesions and no evidence of metastatic disease elsewhere in the abdomen. The contralateral kidney does show a hyperdense but non-enhancing endophytic area within the upper pole of the left kidney. He does have bilateral non-obstructing renal calculi.    He subsequently required an urgent laparoscopic cholecystectomy about one month ago.  Baseline renal function: Cr 1.0, eGFR > 60 ml/min  He denies hematuria, abdominal/flank pain, or family history of kidney cancer.   Past Medical History Problems  1. History of  Heartburn 787.1  Surgical History Problems  1. History of  Inguinal Hernia Repair  Current Meds 1. Adult Aspirin Low Strength 81 MG Oral Tablet Dispersible; Therapy: (Recorded:15Oct2013) to 2. Multi-Day TABS; Therapy: (Recorded:15Oct2013) to 3. PriLOSEC OTC TBEC; Therapy: (Recorded:15Oct2013) to  Allergies Medication  1. No Known Drug Allergies  Family History Problems  1. Paternal history of  Brain Cancer V16.8 2. Maternal history  of  Lung Cancer V16.1 Denied  3. Family history of  End Stage Renal Disease 4. Family history of  Kidney Cancer  Social History Problems    Being A Social Drinker   Former Smoker V15.82 1/2 ppd for 78yrs, nonsmoker for the past 45yrs   Marital History - Currently Married   Occupation: Medical laboratory scientific officer  Review of Systems Constitutional, skin, eye, otolaryngeal, hematologic/lymphatic, cardiovascular, pulmonary, endocrine, musculoskeletal, gastrointestinal, neurological and psychiatric system(s) were reviewed and pertinent findings if present are noted.  Genitourinary: no hematuria.  Gastrointestinal: nausea.  Constitutional: no recent weight loss.  Cardiovascular: no chest pain and no leg swelling.      Physical Exam Constitutional: Well nourished and well developed . No acute distress.  ENT:. The ears and nose are normal in appearance.  Neck: The appearance of the neck is normal and no neck mass is present.  Pulmonary: No respiratory distress, normal respiratory rhythm and effort and clear bilateral breath sounds.  Cardiovascular: Heart rate and rhythm are normal . No peripheral edema.  Abdomen: The abdomen is soft and nontender. No masses are palpated. No CVA tenderness. No hernias are palpable. No hepatosplenomegaly noted.  Lymphatics: The supraclavicular, femoral and inguinal nodes are not enlarged or tender.  Skin: Normal skin turgor, no visible rash and no visible skin lesions.  Neuro/Psych:. Mood and affect are appropriate.    Results/Data Selected Results  UA With REFLEX 29Oct2013 07:48AM Heloise Purpura   Test Name Result Flag Reference  COLOR YELLOW  YELLOW  APPEARANCE CLEAR  CLEAR  SPECIFIC GRAVITY 1.025  1.005-1.030  pH 6.0  5.0-8.0  GLUCOSE NEG mg/dL  NEG  BILIRUBIN NEG  NEG  KETONE NEG mg/dL  NEG  BLOOD NEG  NEG  PROTEIN NEG mg/dL  NEG  UROBILINOGEN 0.2 mg/dL  2.1-3.0  NITRITE NEG  NEG  LEUKOCYTE ESTERASE NEG  NEG   CT-ABD/PELVIS W/W/O CONTRAST  18Oct2013 12:00AM Barron Alvine   Test Name Result Flag Reference  ** RADIOLOGY REPORT BY [facility] **   *RADIOLOGY REPORT*  Clinical Data: Renal mass identified on ultrasound  CT ABDOMEN AND PELVIS WITHOUT AND WITH CONTRAST  Technique: Multidetector CT imaging of the abdomen and pelvis was performed without contrast material in one or both body regions, followed by contrast material(s) and further sections in one or both body regions.  Contrast: 125 ml of Isovue  Comparison: Ultrasound dated 04/26/2012  Findings:  Lung bases are clear. No pericardial or pleural effusion.  Tiny hypodensity in the periphery of the right hepatic lobe measures 5.3 mm, image 22. This is too small to characterize. Along the dome of the liver there is a 3.4 mm hypodensity, image number 10. Also too small to characterize. There is a stone noted within the gallbladder neck measuring 8.2 mm. I suspect there is also a stone within the cystic duct measuring 6.6 mm. The gallbladder wall appears prominent and enhances. No biliary ductal dilatation. The pancreas appears normal. Normal appearance of the spleen.  Bilateral renal calculi are identified. The largest right renal stone is in the upper pole measuring 4 mm, image 43. Within the upper pole the left kidney there is a stone which measures 4.4 mm, image 46. No obstructive uropathy. No ureterolithiasis identified. No bladder stone noted.  Arising from the anterior right upper pole there is a 5.3 x 4.9 x 4.7 cm solid enhancing mass consistent with renal cell carcinoma. Within the posterior and lateral aspect of the upper pole the left kidney there is a focal, indeterminate area of hypoattenuation measuring 2.4 x 1.6 cm, image 44 of series 3. Precontrast Hounsfield units within this area equal 48. Postcontrast Hounsfield units within this area equal to same.  The urinary bladder appears normal. The prostate gland and  seminal vesicles appear normal.  No adenopathy within the upper abdomen. There is no pelvic or inguinal adenopathy. Patency of the renal veins and inferior vena cava noted.  Review of the visualized osseous structures is unremarkable. No aggressive lytic or sclerotic bone lesions noted.  IMPRESSION:  1. Solid enhancing mass arising from the upper pole the right kidney is suspicious for primary renal cell carcinoma. There is no evidence for adenopathy or renal vein/IVC invasion. 2. Focal area of relative decreased attenuation arising from the upper pole the left kidney likely represents an area of scar. No definite enhancement is associated with this area. 3. Stones are noted within the gallbladder fundus and cystic duct.   Original Report Authenticated By: Rosealee Albee, M.D.   BUN & CREATININE 15Oct2013 11:32AM Barron Alvine  SPECIMEN TYPE: BLOOD   Test Name Result Flag Reference  CREATININE 1.00 mg/dL  8.65-7.84  BUN 13 mg/dL  6-96  Est GFR, African American >89 mL/min    Est GFR, NonAfrican American 81 mL/min    The estimated GFR is a calculation valid for adults (51 to 61 years old) that uses the CKD-EPI algorithm to adjust for age and sex. It is not to be used for children, pregnant women, hospitalized patients, patients on dialysis, or with rapidly changing kidney function. According to the NKDEP, eGFR >89 is normal, 60-89 shows mild impairment, 30-59 shows moderate impairment, 15-29 shows severe  impairment and <15 is ESRD.     Assessment Assessed  1. 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 Right renal neoplasm concerning for malignancy:    I have recommended proceeding with surgical excision of his right renal mass. Based on the size and location of his mass, it does appear amenable to a right robotic-assisted laparoscopic partial nephrectomy. He does understand the risks of open surgical conversion and total nephrectomy.

## 2012-06-24 NOTE — Anesthesia Procedure Notes (Signed)
Procedure Name: Intubation Date/Time: 06/24/2012 11:21 AM Performed by: Durward Parcel A Pre-anesthesia Checklist: Patient identified, Emergency Drugs available, Suction available and Patient being monitored Patient Re-evaluated:Patient Re-evaluated prior to inductionOxygen Delivery Method: Circle system utilized Preoxygenation: Pre-oxygenation with 100% oxygen Intubation Type: IV induction Ventilation: Mask ventilation without difficulty Laryngoscope Size: Miller and 2 Grade View: Grade I Tube type: Oral Number of attempts: 1 Airway Equipment and Method: Stylet Placement Confirmation: ETT inserted through vocal cords under direct vision,  breath sounds checked- equal and bilateral and positive ETCO2 Secured at: 23 cm Tube secured with: Tape Dental Injury: Teeth and Oropharynx as per pre-operative assessment

## 2012-06-24 NOTE — Progress Notes (Signed)
Patient ID: Kevin Doyle, male   DOB: 1950/12/02, 61 y.o.   MRN: [id number]  Day of Surgery Subjective:  Pt is doing well. Pain controlled.  Objective: Vital signs in last 24 hours: Temp:  [97.7 F (36.5 C)-98.2 F (36.8 C)] 97.8 F (36.6 C) (12/09 1639) Pulse Rate:  [60-88] 79  (12/09 1639) Resp:  [11-20] 18  (12/09 1639) BP: (124-146)/(53-72) 131/64 mmHg (12/09 1639) SpO2:  [100 %] 100 % (12/09 1639) Weight:  [95.845 kg (211 lb 4.8 oz)] 95.845 kg (211 lb 4.8 oz) (12/09 1639)  Intake/Output from previous day:   Intake/Output this shift: Total I/O In: 4300 [I.V.:3800; IV Piggyback:500] Out: 1825 [Urine:965; Drains:260; Blood:600]  Physical Exam:  General: Alert and oriented CV: RRR Lungs: Clear Abdomen: Soft, ND Incisions: C/D/I Ext: NT, No erythema  Lab Results:  Basename 06/24/12 1545  HGB 11.3*  HCT 32.3*   BMET  Basename 06/24/12 1545  NA 137  K 3.2*  CL 105  CO2 26  GLUCOSE 150*  BUN 9  CREATININE 1.05  CALCIUM 8.0*     Studies/Results: No results found.  Assessment/Plan: Continue to monitor.  Follow renal function and H/H.   LOS: 0 days   Kosisochukwu Burningham,LES 06/24/2012, 6:10 PM

## 2012-06-24 NOTE — Anesthesia Preprocedure Evaluation (Signed)
Anesthesia Evaluation  Patient identified by MRN, date of birth, ID band Patient awake    Reviewed: Allergy & Precautions, H&P , NPO status , Patient's Chart, lab work & pertinent test results, reviewed documented beta blocker date and time   Airway Mallampati: II TM Distance: >3 FB Neck ROM: full    Dental No notable dental hx.    Pulmonary neg pulmonary ROS,  breath sounds clear to auscultation  Pulmonary exam normal       Cardiovascular Exercise Tolerance: Good negative cardio ROS  Rhythm:regular Rate:Normal     Neuro/Psych negative neurological ROS  negative psych ROS   GI/Hepatic negative GI ROS, Neg liver ROS, GERD-  ,  Endo/Other  negative endocrine ROS  Renal/GU negative Renal ROS  negative genitourinary   Musculoskeletal   Abdominal   Peds  Hematology negative hematology ROS (+)   Anesthesia Other Findings   Reproductive/Obstetrics negative OB ROS                           Anesthesia Physical Anesthesia Plan  ASA: II  Anesthesia Plan: General ETT   Post-op Pain Management:    Induction:   Airway Management Planned:   Additional Equipment:   Intra-op Plan:   Post-operative Plan:   Informed Consent: I have reviewed the patients History and Physical, chart, labs and discussed the procedure including the risks, benefits and alternatives for the proposed anesthesia with the patient or authorized representative who has indicated his/her understanding and acceptance.   Dental Advisory Given  Plan Discussed with: CRNA  Anesthesia Plan Comments:         Anesthesia Quick Evaluation

## 2012-06-24 NOTE — Transfer of Care (Signed)
Immediate Anesthesia Transfer of Care Note  Patient: Kevin Doyle  Procedure(s) Performed: Procedure(s) (LRB): ROBOTIC ASSITED PARTIAL NEPHRECTOMY (Right) LAPAROSCOPIC LYSIS OF ADHESIONS ()  Patient Location: PACU  Anesthesia Type: General  Level of Consciousness: sedated, patient cooperative and responds to stimulaton  Airway & Oxygen Therapy: Patient Spontanous Breathing and Patient connected to face mask oxgen  Post-op Assessment: Report given to PACU RN and Post -op Vital signs reviewed and stable  Post vital signs: Reviewed and stable  Complications: No apparent anesthesia complications

## 2012-06-25 ENCOUNTER — Encounter (HOSPITAL_COMMUNITY): Payer: Self-pay | Admitting: Urology

## 2012-06-25 LAB — BASIC METABOLIC PANEL
CO2: 27 mEq/L (ref 19–32)
Chloride: 103 mEq/L (ref 96–112)
Creatinine, Ser: 1.23 mg/dL (ref 0.50–1.35)
Potassium: 4.3 mEq/L (ref 3.5–5.1)

## 2012-06-25 LAB — HEMOGLOBIN AND HEMATOCRIT, BLOOD: Hemoglobin: 11.1 g/dL — ABNORMAL LOW (ref 13.0–17.0)

## 2012-06-25 MED ORDER — HYDROCODONE-ACETAMINOPHEN 5-325 MG PO TABS
1.0000 | ORAL_TABLET | Freq: Four times a day (QID) | ORAL | Status: DC | PRN
  Administered 2012-06-25: 1 via ORAL
  Administered 2012-06-25 – 2012-06-26 (×2): 2 via ORAL
  Filled 2012-06-25: qty 2
  Filled 2012-06-25: qty 1
  Filled 2012-06-25: qty 2

## 2012-06-25 MED ORDER — BISACODYL 10 MG RE SUPP
10.0000 mg | Freq: Once | RECTAL | Status: AC
  Administered 2012-06-25: 10 mg via RECTAL
  Filled 2012-06-25: qty 1

## 2012-06-25 MED ORDER — DOCUSATE SODIUM 100 MG PO CAPS
100.0000 mg | ORAL_CAPSULE | Freq: Two times a day (BID) | ORAL | Status: DC
  Filled 2012-06-25 (×4): qty 1

## 2012-06-25 NOTE — Care Management Note (Signed)
    Page 1 of 1   06/26/2012     11:48:34 AM   CARE MANAGEMENT NOTE 06/26/2012  Patient:  Dick,Burgess   Account Number:  [id number]  Date Initiated:  06/25/2012  Documentation initiated by:  Lanier Clam  Subjective/Objective Assessment:   ADMITTED W/R RENAL NEOPLASM     Action/Plan:   FROM HOME   Anticipated DC Date:  06/26/2012   Anticipated DC Plan:  HOME/SELF CARE         Choice offered to / List presented to:             Status of service:  Completed, signed off Medicare Important Message given?   (If response is "NO", the following Medicare IM given date fields will be blank) Date Medicare IM given:   Date Additional Medicare IM given:    Discharge Disposition:  HOME/SELF CARE  Per UR Regulation:  Reviewed for med. necessity/level of care/duration of stay  If discussed at Long Length of Stay Meetings, dates discussed:    Comments:  06/25/12 Addilynn Mowrer RN,BSN NCM [phone] POD#1 PARTIAL NEPHRECTOMY.

## 2012-06-25 NOTE — Progress Notes (Signed)
Patient ID: Kevin Doyle, male   DOB: October 13, 1950, 61 y.o.   MRN: [id number]  1 Day Post-Op Subjective: The patient is doing well.  No nausea or vomiting. Pain is adequately controlled.  Objective: Vital signs in last 24 hours: Temp:  [97.7 F (36.5 C)-98.2 F (36.8 C)] 98.1 F (36.7 C) (12/10 0255) Pulse Rate:  [59-88] 59  (12/10 0255) Resp:  [11-20] 16  (12/10 0255) BP: (120-154)/(53-84) 120/58 mmHg (12/10 0255) SpO2:  [96 %-100 %] 96 % (12/10 0255) Weight:  [95.845 kg (211 lb 4.8 oz)] 95.845 kg (211 lb 4.8 oz) (12/09 1639)  Intake/Output from previous day: 12/09 0701 - 12/10 0700 In: 6900 [I.V.:6000; IV Piggyback:900] Out: 4913 [Urine:3775; Drains:538; Blood:600] Intake/Output this shift:    Physical Exam:  General: Alert and oriented. CV: RRR Lungs: Clear bilaterally. GI: Soft, Nondistended. Incisions: Clean and dry. Urine: Clear Extremities: Nontender, no erythema, no edema.  Lab Results:  Basename 06/25/12 0505 06/24/12 1545  HGB 11.1* 11.3*  HCT 32.9* 32.3*          Basename 06/25/12 0505 06/24/12 1545 06/19/12 0940  CREATININE 1.23 1.05 0.78           Results for orders placed during the hospital encounter of 06/24/12 (from the past 24 hour(s))  TYPE AND SCREEN     Status: Normal   Collection Time   06/24/12  9:30 AM      Component Value Range   ABO/RH(D) A POS     Antibody Screen NEG     Sample Expiration 06/27/2012    ABO/RH     Status: Normal   Collection Time   06/24/12  9:30 AM      Component Value Range   ABO/RH(D) A POS    BASIC METABOLIC PANEL     Status: Abnormal   Collection Time   06/24/12  3:45 PM      Component Value Range   Sodium 137  135 - 145 mEq/L   Potassium 3.2 (*) 3.5 - 5.1 mEq/L   Chloride 105  96 - 112 mEq/L   CO2 26  19 - 32 mEq/L   Glucose, Bld 150 (*) 70 - 99 mg/dL   BUN 9  6 - 23 mg/dL   Creatinine, Ser 7.82  0.50 - 1.35 mg/dL   Calcium 8.0 (*) 8.4 - 10.5 mg/dL   GFR calc non Af Amer 75 (*) >90 mL/min   GFR calc Af Amer  87 (*) >90 mL/min  HEMOGLOBIN AND HEMATOCRIT, BLOOD     Status: Abnormal   Collection Time   06/24/12  3:45 PM      Component Value Range   Hemoglobin 11.3 (*) 13.0 - 17.0 g/dL   HCT 95.6 (*) 21.3 - 08.6 %  BASIC METABOLIC PANEL     Status: Abnormal   Collection Time   06/25/12  5:05 AM      Component Value Range   Sodium 136  135 - 145 mEq/L   Potassium 4.3  3.5 - 5.1 mEq/L   Chloride 103  96 - 112 mEq/L   CO2 27  19 - 32 mEq/L   Glucose, Bld 150 (*) 70 - 99 mg/dL   BUN 9  6 - 23 mg/dL   Creatinine, Ser 5.78  0.50 - 1.35 mg/dL   Calcium 8.6  8.4 - 46.9 mg/dL   GFR calc non Af Amer 62 (*) >90 mL/min   GFR calc Af Amer 72 (*) >90 mL/min  HEMOGLOBIN AND HEMATOCRIT, BLOOD  Status: Abnormal   Collection Time   06/25/12  5:05 AM      Component Value Range   Hemoglobin 11.1 (*) 13.0 - 17.0 g/dL   HCT 16.1 (*) 09.6 - 04.5 %    Assessment/Plan: POD# 1 s/p robotic partial nephrectomy.  1) Ambulate, Incentive spirometry 2) Advance diet as tolerated 3) Transition to oral pain medication 4) Dulcolax suppository 5) D/C urethral catheter   Moody Bruins. MD   LOS: 1 day   Jariel Drost,LES 06/25/2012, 7:13 AM

## 2012-06-26 LAB — BASIC METABOLIC PANEL
BUN: 9 mg/dL (ref 6–23)
Calcium: 8.6 mg/dL (ref 8.4–10.5)
Creatinine, Ser: 1.32 mg/dL (ref 0.50–1.35)
GFR calc Af Amer: 66 mL/min — ABNORMAL LOW (ref >90)
GFR calc non Af Amer: 57 mL/min — ABNORMAL LOW (ref >90)

## 2012-06-26 MED ORDER — DSS 100 MG PO CAPS: 100.0000 mg | ORAL_CAPSULE | Freq: Two times a day (BID) | ORAL | Status: DC

## 2012-06-26 MED ORDER — HYDROCODONE-ACETAMINOPHEN 5-325 MG PO TABS: 1.0000 | ORAL_TABLET | Freq: Four times a day (QID) | ORAL | Status: DC | PRN

## 2012-06-26 NOTE — Progress Notes (Signed)
Patient ID: Kevin Doyle, male   DOB: [birth year], 61 y.o.   MRN: [id number]  2 Days Post-Op Subjective: The patient is doing well.  No nausea or vomiting. Pain is adequately controlled. Passing flatus and tolerating regular diet.  Objective: Vital signs in last 24 hours: Temp:  [98.6 F (37 C)-99.3 F (37.4 C)] 98.6 F (37 C) (12/11 0602) Pulse Rate:  [74-100] 100  (12/11 0602) Resp:  [16-18] 18  (12/11 0602) BP: (141-147)/(55-76) 141/76 mmHg (12/11 0602) SpO2:  [95 %-99 %] 95 % (12/11 0602)  Intake/Output from previous day: 12/10 0701 - 12/11 0700 In: 840 [P.O.:840] Out: 2100 [Urine:1975; Drains:125] Intake/Output this shift:    Physical Exam:  General: Alert and oriented. CV: RRR Lungs: Clear bilaterally. GI: Soft, Nondistended. Incisions: Clean and dry. Urine: Clear Extremities: Nontender, no erythema, no edema.  Lab Results:  Basename 06/25/12 0505 06/24/12 1545  HGB 11.1* 11.3*  HCT 32.9* 32.3*          Basename 06/26/12 0530 06/25/12 0505 06/24/12 1545  CREATININE 1.32 1.23 1.05           Results for orders placed during the hospital encounter of 06/24/12 (from the past 24 hour(s))  BASIC METABOLIC PANEL     Status: Abnormal   Collection Time   06/26/12  5:30 AM      Component Value Range   Sodium 136  135 - 145 mEq/L   Potassium 3.7  3.5 - 5.1 mEq/L   Chloride 102  96 - 112 mEq/L   CO2 26  19 - 32 mEq/L   Glucose, Bld 110 (*) 70 - 99 mg/dL   BUN 9  6 - 23 mg/dL   Creatinine, Ser 1.32  0.50 - 1.35 mg/dL   Calcium 8.6  8.4 - 44.0 mg/dL   GFR calc non Af Amer 57 (*) >90 mL/min   GFR calc Af Amer 66 (*) >90 mL/min    Assessment/Plan: POD# 2 s/p robotic partial nephrectomy.  1) Ambulate, Incentive spirometry 2) Check drain creatinine level 3) Sl IVF 4) D/C home  Kevin Doyle. MD   LOS: 2 days   Kevin Doyle,LES 06/26/2012, 7:03 AM

## 2012-06-28 NOTE — Discharge Summary (Signed)
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  Secondary diagnoses: cholelithiasis, heartburn  History and Physical: For full details, please see admission history and physical. Briefly, Kevin Doyle is a 61 y.o. year old patient who has a history of intermittent epigatric pain and was evaluated by Dr. Sheryn Bison. He underwent an abdominal ultrasound which confirmed cholelithiasis raising the possibility that his symptoms may be related to symptomatic cholelithiasis. He saw Dr. Avel Peace for evaluation and underwent a laparoscopic cholecystectomy. Incidentally, on his ultrasound imaging he was noted to have solid appearing mass off the upper pole of the right kidney as well. He was seen by Dr. 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 There was no evidence of regional lymphadenopathy or renal vein/IVC involvement. There were no adrenal lesions and no evidence of metastatic disease elsewhere in the abdomen. The contralateral kidney does show a hyperdense but non-enhancing endophytic area within the upper pole of the left kidney. He does have bilateral non-obstructing renal calculi.  Baseline renal function: Cr 1.0, eGFR > 60 ml/min  He denies hematuria, abdominal/flank pain, or family history of kidney cancer.   Hospital Course: Pt was admitted and taken to the OR on 06/24/12 for a right robotic assisted laparoscopic partial nephrectomy and laparoscopic adhesiolysis.  Pt tolerated the procedure well and was hemodynamically stable immediately post op.  He was extubated without complication and woke up from anesthesia neurologically intact.  His post op course progressed as expected.  His vitals were stable throughout the post op course.  His foley  was removed on POD 1 and he was able to void.  His diet was advanced accordingly and he tolerated a regular diet.  Pt was able to ambulate without difficulty.  JP drainage Cr was checked and this was consistent with serum, therefore, the drain was removed on POD 2.  Pt was doing well and felt stable for d/c home on POD 2.   Laboratory values:  Lab Results  Component Value Date   WBC 5.6 06/19/2012   HGB 11.1* 06/25/2012   HCT 32.9* 06/25/2012   MCV 86.0 06/19/2012   PLT 221 06/19/2012    Basename 06/26/12 0530  CREATININE 1.32    Disposition: Home  Discharge instruction: The patient was instructed to be ambulatory but told to refrain from heavy lifting, strenuous activity, or driving.   Discharge medications:    Medication List     As of 06/28/2012  9:24 AM    START taking these medications         DSS 100 MG Caps   Take 100 mg by mouth 2 (two) times daily.      HYDROcodone-acetaminophen 5-325 MG per tablet   Commonly known as: NORCO/VICODIN   Take 1-2 tablets by mouth every 6 (six) hours as needed (pain).      STOP taking these medications         aspirin EC 81 MG tablet      ibuprofen 200 MG tablet   Commonly known as: ADVIL,MOTRIN      multivitamin with minerals Tabs          Where to get your medications    These are the prescriptions that you need to pick up.   You may get these medications from  any pharmacy.         DSS 100 MG Caps   HYDROcodone-acetaminophen 5-325 MG per tablet            Followup:  Follow-up Information    Follow up with BORDEN,LES, MD. On 07/18/2012. (at 3:00)    Contact information:   [address] [place] [address] [phone]

## 2012-07-08 ENCOUNTER — Telehealth (INDEPENDENT_AMBULATORY_CARE_PROVIDER_SITE_OTHER): Payer: Self-pay | Admitting: General Surgery

## 2012-07-08 NOTE — Telephone Encounter (Signed)
Pt called to report mid-epigastric pain, similar to pain before GB surgery.  He had stopped taking his Prilosec.  After discussion of what seems to trigger his pain, he has decided to restart the Prilosec BID x2 weeks, the decrease to QD.

## 2012-07-10 ENCOUNTER — Emergency Department (HOSPITAL_COMMUNITY): Payer: Managed Care, Other (non HMO)

## 2012-07-10 ENCOUNTER — Inpatient Hospital Stay (HOSPITAL_COMMUNITY)
Admission: EM | Admit: 2012-07-10 | Discharge: 2012-07-11 | DRG: 445 | Disposition: A | Discharge status: Home / Self Care | Payer: Managed Care, Other (non HMO) | Attending: Internal Medicine | Admitting: Internal Medicine

## 2012-07-10 ENCOUNTER — Encounter (HOSPITAL_COMMUNITY): Payer: Self-pay | Admitting: *Deleted

## 2012-07-10 DIAGNOSIS — K8309 Other cholangitis: Secondary | ICD-10-CM

## 2012-07-10 DIAGNOSIS — R030 Elevated blood-pressure reading, without diagnosis of hypertension: Secondary | ICD-10-CM | POA: Diagnosis present

## 2012-07-10 DIAGNOSIS — K219 Gastro-esophageal reflux disease without esophagitis: Secondary | ICD-10-CM | POA: Diagnosis present

## 2012-07-10 DIAGNOSIS — R7402 Elevation of levels of lactic acid dehydrogenase (LDH): Secondary | ICD-10-CM | POA: Diagnosis present

## 2012-07-10 DIAGNOSIS — N289 Disorder of kidney and ureter, unspecified: Secondary | ICD-10-CM | POA: Diagnosis present

## 2012-07-10 DIAGNOSIS — Z87891 Personal history of nicotine dependence: Secondary | ICD-10-CM

## 2012-07-10 DIAGNOSIS — R7401 Elevation of levels of liver transaminase levels: Secondary | ICD-10-CM | POA: Diagnosis present

## 2012-07-10 DIAGNOSIS — R109 Unspecified abdominal pain: Secondary | ICD-10-CM

## 2012-07-10 DIAGNOSIS — E876 Hypokalemia: Secondary | ICD-10-CM | POA: Diagnosis present

## 2012-07-10 DIAGNOSIS — R17 Unspecified jaundice: Secondary | ICD-10-CM | POA: Diagnosis present

## 2012-07-10 DIAGNOSIS — K8050 Calculus of bile duct without cholangitis or cholecystitis without obstruction: Principal | ICD-10-CM | POA: Diagnosis present

## 2012-07-10 DIAGNOSIS — D649 Anemia, unspecified: Secondary | ICD-10-CM | POA: Diagnosis present

## 2012-07-10 DIAGNOSIS — Z9089 Acquired absence of other organs: Secondary | ICD-10-CM

## 2012-07-10 DIAGNOSIS — Z79899 Other long term (current) drug therapy: Secondary | ICD-10-CM

## 2012-07-10 HISTORY — DX: Gilbert syndrome: E80.4

## 2012-07-10 LAB — TROPONIN I: Troponin I: <0.3 ng/mL (ref <0.30)

## 2012-07-10 LAB — URINALYSIS, ROUTINE W REFLEX MICROSCOPIC
Glucose, UA: NEGATIVE mg/dL
Nitrite: NEGATIVE
Protein, ur: 30 mg/dL — AB

## 2012-07-10 LAB — COMPREHENSIVE METABOLIC PANEL
ALT: 350 U/L — ABNORMAL HIGH (ref 0–53)
AST: 177 U/L — ABNORMAL HIGH (ref 0–37)
Calcium: 9.1 mg/dL (ref 8.4–10.5)
Creatinine, Ser: 0.99 mg/dL (ref 0.50–1.35)
GFR calc Af Amer: >90 mL/min (ref >90)
Glucose, Bld: 148 mg/dL — ABNORMAL HIGH (ref 70–99)
Sodium: 139 mEq/L (ref 135–145)
Total Protein: 7.2 g/dL (ref 6.0–8.3)

## 2012-07-10 LAB — URINE MICROSCOPIC-ADD ON

## 2012-07-10 LAB — CBC WITH DIFFERENTIAL/PLATELET
Basophils Absolute: 0 10*3/uL (ref 0.0–0.1)
Eosinophils Absolute: 0 10*3/uL (ref 0.0–0.7)
Eosinophils Relative: 0 % (ref 0–5)
MCH: 29.4 pg (ref 26.0–34.0)
MCV: 86.3 fL (ref 78.0–100.0)
Platelets: 362 10*3/uL (ref 150–400)
RDW: 13.2 % (ref 11.5–15.5)

## 2012-07-10 MED ORDER — ACETAMINOPHEN 325 MG PO TABS: 650.0000 mg | ORAL_TABLET | Freq: Four times a day (QID) | ORAL | Status: DC | PRN

## 2012-07-10 MED ORDER — ONDANSETRON HCL 4 MG/2ML IJ SOLN
4.0000 mg | Freq: Once | INTRAMUSCULAR | Status: AC
  Administered 2012-07-10: 4 mg via INTRAVENOUS
  Filled 2012-07-10: qty 2

## 2012-07-10 MED ORDER — ONDANSETRON HCL 4 MG/2ML IJ SOLN: 4.0000 mg | Freq: Three times a day (TID) | INTRAMUSCULAR | Status: AC | PRN

## 2012-07-10 MED ORDER — SODIUM CHLORIDE 0.9 % IV BOLUS (SEPSIS)
1000.0000 mL | Freq: Once | INTRAVENOUS | Status: AC
  Administered 2012-07-10: 1000 mL via INTRAVENOUS

## 2012-07-10 MED ORDER — ACETAMINOPHEN 650 MG RE SUPP: 650.0000 mg | Freq: Four times a day (QID) | RECTAL | Status: DC | PRN

## 2012-07-10 MED ORDER — CIPROFLOXACIN IN D5W 400 MG/200ML IV SOLN
400.0000 mg | Freq: Two times a day (BID) | INTRAVENOUS | Status: DC
  Administered 2012-07-10 – 2012-07-11 (×3): 400 mg via INTRAVENOUS
  Filled 2012-07-10 (×4): qty 200

## 2012-07-10 MED ORDER — HYDROMORPHONE HCL PF 1 MG/ML IJ SOLN
1.0000 mg | Freq: Once | INTRAMUSCULAR | Status: AC
  Administered 2012-07-10: 1 mg via INTRAVENOUS
  Filled 2012-07-10: qty 1

## 2012-07-10 MED ORDER — HYDROMORPHONE HCL PF 1 MG/ML IJ SOLN: 1.0000 mg | INTRAMUSCULAR | Status: AC | PRN

## 2012-07-10 MED ORDER — OXYCODONE HCL 5 MG PO TABS: 5.0000 mg | ORAL_TABLET | ORAL | Status: DC | PRN

## 2012-07-10 MED ORDER — POTASSIUM CHLORIDE IN NACL 40-0.9 MEQ/L-% IV SOLN
INTRAVENOUS | Status: DC
  Administered 2012-07-10: 14:00:00 via INTRAVENOUS
  Filled 2012-07-10 (×4): qty 1000

## 2012-07-10 MED ORDER — SODIUM CHLORIDE 0.9 % IV SOLN
INTRAVENOUS | Status: DC
  Administered 2012-07-10: 12:00:00 via INTRAVENOUS

## 2012-07-10 MED ORDER — SODIUM CHLORIDE 0.9 % IV SOLN
Freq: Once | INTRAVENOUS | Status: AC
  Administered 2012-07-10: 04:00:00 via INTRAVENOUS

## 2012-07-10 MED ORDER — FENTANYL CITRATE 0.05 MG/ML IJ SOLN
50.0000 ug | Freq: Once | INTRAMUSCULAR | Status: AC
  Administered 2012-07-10: 50 ug via INTRAVENOUS
  Filled 2012-07-10: qty 2

## 2012-07-10 MED ORDER — METOPROLOL TARTRATE 1 MG/ML IV SOLN
2.5000 mg | Freq: Four times a day (QID) | INTRAVENOUS | Status: DC | PRN
  Filled 2012-07-10: qty 5

## 2012-07-10 MED ORDER — SODIUM CHLORIDE 0.9 % IV SOLN
3.0000 g | Freq: Once | INTRAVENOUS | Status: AC
  Administered 2012-07-10: 3 g via INTRAVENOUS
  Filled 2012-07-10: qty 3

## 2012-07-10 NOTE — Consult Note (Signed)
[facility] Consultation  Referring Provider: No ref. provider found Primary Care Physician:  Rudi Heap, MD Primary Gastroenterologist:  Dr.  Jaquita Rector for Consultation:  *Abdominal pain**  HPI: Kevin Doyle is a 61 y.o. male status post laparoscopic cholecystectomy in early November admitted with abdominal pain. I will see was normal and preoperative LFTs were normal. Over the past 5 days he's had intermittent severe subxiphoid pain reminiscent of his gallbladder pain. Pain could last minutes to couple of hours and will sometimes accompanied by nausea and vomiting. In the ER she was noted to be jaundiced. Abdominal ultrasound, which I reviewed, demonstrated prominence of the intrahepatic ducts and slight dilatation of the common bile duct. At bedtime a history of Gilbert's disease.  Past Medical History  Diagnosis Date  . GERD (gastroesophageal reflux disease)     Past Surgical History  Procedure Date  . Hernia repairs     x 3   . Colonoscopy   . Upper gastrointestinal endoscopy   . Inguinal hernia repair 1986  . Cholecystectomy 05/18/2012    Procedure: LAPAROSCOPIC CHOLECYSTECTOMY WITH INTRAOPERATIVE CHOLANGIOGRAM;  Surgeon: Valarie Merino, MD;  Location: [facility];  Service: General;  Laterality: N/A;  . Robotic assited partial nephrectomy 06/24/2012    Procedure: ROBOTIC ASSITED PARTIAL NEPHRECTOMY;  Surgeon: Crecencio Mc, MD;  Location: [facility];  Service: Urology;  Laterality: Right;  . Laparoscopic lysis of adhesions 06/24/2012    Procedure: LAPAROSCOPIC LYSIS OF ADHESIONS;  Surgeon: Crecencio Mc, MD;  Location: [facility];  Service: Urology;;    Prior to Admission medications   Medication Sig Start Date End Date Taking? Authorizing Provider  acetaminophen (TYLENOL) 500 MG tablet Take 500-1,000 mg by mouth every 6 (six) hours as needed. For pain   Yes Historical Provider, MD  HYDROcodone-acetaminophen (NORCO/VICODIN) 5-325 MG per tablet Take 1-2 tablets by mouth every 6 (six)  hours as needed (pain). 06/26/12  Yes Darol Destine. Myer Haff, PA  omeprazole (PRILOSEC) 20 MG capsule Take 20 mg by mouth daily.   Yes Historical Provider, MD    Current Facility-Administered Medications  Medication Dose Route Frequency Provider Last Rate Last Dose  . 0.9 %  sodium chloride infusion   Intravenous Continuous Ward Givens, MD 125 mL/hr at 07/10/12 1131    . HYDROmorphone (DILAUDID) injection 1 mg  1 mg Intravenous Q4H PRN Ward Givens, MD      . ondansetron (ZOFRAN) injection 4 mg  4 mg Intravenous Q8H PRN Ward Givens, MD        Allergies as of 07/10/2012  . (No Known Allergies)    Family History  Problem Relation Age of Onset  . Colon cancer Neg Hx   . Lung cancer Mother   . Brain cancer Father     History   Social History  . Marital Status: Married    Spouse Name: N/A    Number of Children: N/A  . Years of Education: N/A   Occupational History  . Not on file.   Social History Main Topics  . Smoking status: Former Games developer  . Smokeless tobacco: Former Neurosurgeon    Quit date: 06/19/1986     Comment: Quit at age 50   . Alcohol Use: Yes     Comment: Rare-Beer   . Drug Use: No  . Sexually Active: Not on file   Other Topics Concern  . Not on file   Social History Narrative   2 cups of coffee daily     Review of Systems:  Gen: Denies any fever, chills, sweats, anorexia, fatigue, weakness, malaise, weight loss, and sleep disorder CV: Denies chest pain, angina, palpitations, syncope, orthopnea, PND, peripheral edema, and claudication. Resp: Denies dyspnea at rest, dyspnea with exercise, cough, sputum, wheezing, coughing up blood, and pleurisy. GI: Denies vomiting blood, jaundice, and fecal incontinence.   Denies dysphagia or odynophagia. GU : Denies urinary burning, blood in urine, urinary frequency, urinary hesitancy, nocturnal urination, and urinary incontinence. MS: Denies joint pain, limitation of movement, and swelling, stiffness, low back pain, extremity pain.  Denies muscle weakness, cramps, atrophy.  Derm: Denies rash, itching, dry skin, hives, moles, warts, or unhealing ulcers.  Psych: Denies depression, anxiety, memory loss, suicidal ideation, hallucinations, paranoia, and confusion. Heme: Denies bruising, bleeding, and enlarged lymph nodes. Neuro:  Denies any headaches, dizziness, paresthesias. Endo:  Denies any problems with DM, thyroid, adrenal function.  Physical Exam: Vital signs in last 24 hours: Temp:  [98 F (36.7 C)-98.7 F (37.1 C)] 98.7 F (37.1 C) (12/25 0640) Pulse Rate:  [66-95] 95  (12/25 0640) Resp:  [18-22] 18  (12/25 0640) BP: (147-173)/(60-77) 147/60 mmHg (12/25 0640) SpO2:  [96 %-98 %] 96 % (12/25 0640) Weight:  [200 lb (90.719 kg)] 200 lb (90.719 kg) (12/25 0345)   General:   Alert,  Well-developed, well-nourished, pleasant and cooperative in NAD Head:  Normocephalic and atraumatic. Eyes:  Sclera clear, no icterus.   Conjunctiva pink. Ears:  Normal auditory acuity. Nose:  No deformity, discharge,  or lesions. Mouth:  No deformity or lesions.  Oropharynx pink & moist. Neck:  Supple; no masses or thyromegaly. Lungs:  Clear throughout to auscultation.   No wheezes, crackles, or rhonchi. No acute distress. Heart:  Regular rate and rhythm; no murmurs, clicks, rubs,  or gallops. Abdomen:  Soft, nontender and nondistended. No masses, hepatosplenomegaly or hernias noted. Normal bowel sounds, without guarding, and without rebound.   Rectal:  Deferred until time of colonoscopy.   Msk:  Symmetrical without gross deformities. Normal posture. Pulses:  Normal pulses noted. Extremities:  Without clubbing or edema. Neurologic:  Alert and  oriented x4;  grossly normal neurologically. Skin:  Intact without significant lesions or rashes. Cervical Nodes:  No significant cervical adenopathy. Psych:  Alert and cooperative. Normal mood and affect.  Intake/Output from previous day: 12/24 0701 - 12/25 0700 In: 2000 [I.V.:2000] Out:  -  Intake/Output this shift: Total I/O In: -  Out: 1025 [Urine:1025]  Lab Results:  [facility] 07/10/12 0405  WBC 9.2  HGB 11.8*  HCT 34.7*  PLT 362   BMET  Basename 07/10/12 0405  NA 139  K 3.1*  CL 99  CO2 27  GLUCOSE 148*  BUN 13  CREATININE 0.99  CALCIUM 9.1   LFT  Basename 07/10/12 0405  PROT 7.2  ALBUMIN 3.8  AST 177*  ALT 350*  ALKPHOS 522*  BILITOT 5.6*  BILIDIR --  IBILI --   PT/INR No results found for this basename: LABPROT:2,INR:2 in the last 72 hours Hepatitis Panel No results found for this basename: HEPBSAG,HCVAB,HEPAIGM,HEPBIGM in the last 72 hours C-Diff   Studies/Results: US Abdomen Complete  07/10/2012  *RADIOLOGY REPORT*  Clinical Data:  Recent cholecystectomy, still right upper quadrant pain with elevated LFTs and fever  COMPLETE ABDOMINAL ULTRASOUND  Comparison:  Operative cholangiogram of 05/18/2002 pain and CT abdomen pelvis of 05/03/2012  Findings:  Gallbladder:  The gallbladder has been surgically resected.  Common bile duct:  Common bile duct is prominent measuring 13.4 mm. This may be normal post  cholecystectomy but correlation with LFTs is recommended.  Liver:  The liver has a normal echogenic pattern.  However there is slight prominence of the central intrahepatic ducts in this patient who has recently post cholecystectomy.  IVC:  Appears normal.  Pancreas:  No focal abnormality seen.  Spleen:  The spleen is enlarged measuring 14.0 cm sagittally.  Right Kidney:  No hydronephrosis is seen.  The right kidney measures 11.3 cm sagittally.  By history the right renal mass noted previously has been resected.  Left Kidney:  No hydronephrosis is noted.  The left kidney measures 13.6 cm.  Abdominal aorta:  Portions of the abdominal aorta are obscured but no aneurysm is seen.  IMPRESSION:  1.  Somewhat prominent central intrahepatic ducts and common bile duct most likely normal post cholecystectomy.  Correlate with LFTs. 2.  Splenomegaly. 3.  Interval  cholecystectomy and apparent prior resection of right renal lesion.   Original Report Authenticated By: Dwyane Dee, M.D.      Impression/ Recommendations: *Recurrent abdominal pain with new onset jaundice and cholestatic liver tests likely secondary to a retained bile duct stone.  *Recommendations 1.  begin Cipro 400 mg IV 2.  ERCP - scheduled for 07/11/12  The risks, benefits, and possible complications of the procedure, including bleeding, perforation, surgery, and the 5-10% risk for pancreatitis, were explained to the patient.  Patient's questions were answered. *    LOS: 0 days   Melvia Heaps  07/10/2012, 12:25 PM

## 2012-07-10 NOTE — ED Notes (Signed)
Ultrasound staff at bedside

## 2012-07-10 NOTE — Progress Notes (Signed)
Dr Rama sent a text informed of pt here and of bp readings.

## 2012-07-10 NOTE — H&P (Signed)
[facility] History and Physical  Kevin Doyle ZOX:[id number] DOB: 29-Oct-1950 DOA: 07/10/2012  Referring physician: Dr. Devoria Albe PCP: Rudi Heap, MD   Chief Complaint: N/V, abdominal pain   History of Present Illness: Kevin Doyle is an 61 y.o. male with a PMH of recent cholecystecomy (05/18/12) by Dr. Daphine Deutscher.  Post-operative course was characterized by intermittent abdominal pain associated with fever, the first episode was 4 days ago, and he has had 1-2 episodes daily since that time.  Pain comes on suddenly, lasts 1-2 hours, and feels like the type of pain he was having before his gallbladder was removed.  Pain is lower sternal area.  No association of the pain with meal intake.  Has had some intermittent N/V as well.  No melena or hematochezia.  Stools soft and loose since cholecystectomy.  Has had low grade fever as well.  He had an abdominal ultrasound on admission which showed prominence of the intrahepatic ducts and slight dilatation of the common bile duct.    Review of Systems: Constitutional: + fever, no chills;  Appetite normal; No weight loss, no weight gain.  HEENT: No blurry vision, no diplopia, no pharyngitis, no dysphagia CV: No chest pain, no palpitations.  Resp: No SOB, no cough. GI: + nausea and vomiting, + loose stool, no melena, no hematochezia.  GU: No dysuria, no hematuria.  MSK: no myalgias, no arthralgias.  Neuro:  No headache, no focal neurological deficits, no history of seizures.  Psych: No depression, no anxiety.  Endo: No thyroid disease, no DM, no heat intolerance, no cold intolerance, no polyuria, no polydipsia  Skin: No rashes, no skin lesions.  Heme: No easy bruising, no history of blood diseases.  Past Medical History Past Medical History  Diagnosis Date  . GERD (gastroesophageal reflux disease)   . Gilbert's disease      Past Surgical History Past Surgical History  Procedure Date  . Hernia repairs     x 3   . Colonoscopy   . Upper gastrointestinal  endoscopy   . Inguinal hernia repair 1986  . Cholecystectomy 05/18/2012    Procedure: LAPAROSCOPIC CHOLECYSTECTOMY WITH INTRAOPERATIVE CHOLANGIOGRAM;  Surgeon: Valarie Merino, MD;  Location: [facility];  Service: General;  Laterality: N/A;  . Robotic assited partial nephrectomy 06/24/2012    Procedure: ROBOTIC ASSITED PARTIAL NEPHRECTOMY;  Surgeon: Crecencio Mc, MD;  Location: [facility];  Service: Urology;  Laterality: Right;  . Laparoscopic lysis of adhesions 06/24/2012    Procedure: LAPAROSCOPIC LYSIS OF ADHESIONS;  Surgeon: Crecencio Mc, MD;  Location: [facility];  Service: Urology;;     Social History: History   Social History  . Marital Status: Married    Spouse Name: N/A    Number of Children: N/A  . Years of Education: N/A   Occupational History  . Not on file.   Social History Main Topics  . Smoking status: Former Games developer  . Smokeless tobacco: Former Neurosurgeon    Quit date: 06/19/1986     Comment: Quit at age 62   . Alcohol Use: Yes     Comment: Rare-Beer   . Drug Use: No  . Sexually Active: Not on file   Other Topics Concern  . Not on file   Social History Narrative   Married.  Independent of ADLs.  2 cups of coffee daily     Family History:  Family History  Problem Relation Age of Onset  . Colon cancer Neg Hx   . Lung cancer Mother   .  Brain cancer Father     Allergies: Review of patient's allergies indicates no known allergies.  Meds: Prior to Admission medications   Medication Sig Start Date End Date Taking? Authorizing Provider  acetaminophen (TYLENOL) 500 MG tablet Take 500-1,000 mg by mouth every 6 (six) hours as needed. For pain   Yes Historical Provider, MD  HYDROcodone-acetaminophen (NORCO/VICODIN) 5-325 MG per tablet Take 1-2 tablets by mouth every 6 (six) hours as needed (pain). 06/26/12  Yes Darol Destine. Myer Haff, PA  omeprazole (PRILOSEC) 20 MG capsule Take 20 mg by mouth daily.   Yes Historical Provider, MD    Physical Exam: Filed Vitals:   07/10/12 0345  07/10/12 0535 07/10/12 0640  BP: 173/77  147/60  Pulse: 66  95  Temp: 98 F (36.7 C) 98.3 F (36.8 C) 98.7 F (37.1 C)  TempSrc: Oral Oral Oral  Resp: 22  18  Height: 6\' 2"  (1.88 m)    Weight: 90.719 kg (200 lb)    SpO2: 98%  96%     Physical Exam: Blood pressure 147/60, pulse 95, temperature 98.7 F (37.1 C), temperature source Oral, resp. rate 18, height 6\' 2"  (1.88 m), weight 90.719 kg (200 lb), SpO2 96.00%. Gen:  NAD Head: Normocephalic/atraumatic Eyes: PERRL, EOMI, sclerae mildly icteric Mouth: Mucous membranes dry no lesions  Neck:  Supple, no TM, no LAD, no JVD Chest: Lungs CTAB CV: RRR, S1, S2 no M/R/G Abdomen: Soft, tender RUQ, +BS Extremities: No C/E/C Skin: Warm and dry Neuro: A+O x 3, non-focal Psych: Mood and affect normal.  Labs on Admission:  Basic Metabolic Panel:  Lab 07/10/12 1610  NA 139  K 3.1*  CL 99  CO2 27  GLUCOSE 148*  BUN 13  CREATININE 0.99  CALCIUM 9.1  MG --  PHOS --   Liver Function Tests:  Lab 07/10/12 0405  AST 177*  ALT 350*  ALKPHOS 522*  BILITOT 5.6*  PROT 7.2  ALBUMIN 3.8    Lab 07/10/12 0405  LIPASE 51  AMYLASE --   CBC:  Lab 07/10/12 0405  WBC 9.2  NEUTROABS 8.3*  HGB 11.8*  HCT 34.7*  MCV 86.3  PLT 362   Cardiac Enzymes:  Lab 07/10/12 0405  CKTOTAL --  CKMB --  CKMBINDEX --  TROPONINI <0.30   Radiological Exams on Admission: US Abdomen Complete  07/10/2012  *RADIOLOGY REPORT*  Clinical Data:  Recent cholecystectomy, still right upper quadrant pain with elevated LFTs and fever  COMPLETE ABDOMINAL ULTRASOUND  Comparison:  Operative cholangiogram of 05/18/2002 pain and CT abdomen pelvis of 05/03/2012  Findings:  Gallbladder:  The gallbladder has been surgically resected.  Common bile duct:  Common bile duct is prominent measuring 13.4 mm. This may be normal post cholecystectomy but correlation with LFTs is recommended.  Liver:  The liver has a normal echogenic pattern.  However there is slight  prominence of the central intrahepatic ducts in this patient who has recently post cholecystectomy.  IVC:  Appears normal.  Pancreas:  No focal abnormality seen.  Spleen:  The spleen is enlarged measuring 14.0 cm sagittally.  Right Kidney:  No hydronephrosis is seen.  The right kidney measures 11.3 cm sagittally.  By history the right renal mass noted previously has been resected.  Left Kidney:  No hydronephrosis is noted.  The left kidney measures 13.6 cm.  Abdominal aorta:  Portions of the abdominal aorta are obscured but no aneurysm is seen.  IMPRESSION:  1.  Somewhat prominent central intrahepatic ducts and common bile  duct most likely normal post cholecystectomy.  Correlate with LFTs. 2.  Splenomegaly. 3.  Interval cholecystectomy and apparent prior resection of right renal lesion.   Original Report Authenticated By: Dwyane Dee, M.D.     Assessment/Plan Principal Problem:  *Choledocholithiasis / Elevated LFTs  S/P cholecystectomy 05/18/2012.  LFTs completely normal prior to surgery.  No evidence of cholangitis (afebrile, normal WBC) so no need for antibiotics.  Seen by Dr. Arlyce Dice, for ERCP to evaluate for retained stones.  Continue IVF, supportive care. Active Problems:  Hypokalemia  Will replace potassium in IVF.  Elevated BP  No h/o HTN.  May be from pain.  Will give PRN metoprolol for now.  Normocytic anemia  Mild.  May be from recent surgery.    Code Status: Full Family Communication: Kevin Doyle (wife) Disposition Plan: Home when stable.  Time spent: 1 hour.  Teosha Casso [facility] Pager [phone]  If 7PM-7AM, please contact night-coverage [email or web address] Password [email or web address] 07/10/2012, 1:23 PM

## 2012-07-10 NOTE — ED Provider Notes (Signed)
History     CSN: [id number]  Arrival date & time 07/10/12  0272   First MD Initiated Contact with Patient 07/10/12 [phone]      Chief Complaint  Patient presents with  . Abdominal Pain  . Emesis    (Consider location/radiation/quality/duration/timing/severity/associated sxs/prior treatment) HPI  Patient reports he had a cholecystectomy done on November 2 by Dr. Daphine Deutscher. He reports at that time he was having epigastric abdominal pains. 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 He reports off and on since December 21 he has been having intermittent epigastric abdominal pain. He does not relate it to anything he eats or does. He reports this evening about 11 PM however the pain got more intense and more persistent and is not going away. He describes the pain as sharp and burning and it does not radiate. He states it does hurt more with a deep breaths. He has had nausea and vomiting and some diarrhea. He reports he did have a fever of 100.5 earlier today. He states he has a mild cough but denies shortness of breath.  PCP Dr. Rudi Heap in [place]  Past Medical History  Diagnosis Date  . GERD (gastroesophageal reflux disease)     Past Surgical History  Procedure Date  . Hernia repairs     x 3   . Colonoscopy   . Upper gastrointestinal endoscopy   . Inguinal hernia repair 1986  . Cholecystectomy 05/18/2012    Procedure: LAPAROSCOPIC CHOLECYSTECTOMY WITH INTRAOPERATIVE CHOLANGIOGRAM;  Surgeon: Valarie Merino, MD;  Location: [facility];  Service: General;  Laterality: N/A;  . Robotic assited partial nephrectomy 06/24/2012    Procedure: ROBOTIC ASSITED PARTIAL NEPHRECTOMY;  Surgeon: Crecencio Mc, MD;  Location: [facility];  Service: Urology;  Laterality: Right;  . Laparoscopic lysis of adhesions 06/24/2012    Procedure: LAPAROSCOPIC LYSIS OF ADHESIONS;  Surgeon: Crecencio Mc, MD;  Location: [facility];  Service: Urology;;    Family History  Problem Relation Age of Onset  . Colon  cancer Neg Hx   . Lung cancer Mother   . Brain cancer Father     History  Substance Use Topics  . Smoking status: Former Games developer  . Smokeless tobacco: Former Neurosurgeon    Quit date: 06/19/1986     Comment: Quit at age 78   . Alcohol Use: Yes     Comment: Rare-Beer    Lives at home Lives with spouse  Review of Systems  All other systems reviewed and are negative.    Allergies  Review of patient's allergies indicates no known allergies.  Home Medications   Current Outpatient Rx  Name  Route  Sig  Dispense  Refill  . ACETAMINOPHEN 500 MG PO TABS   Oral   Take 500-1,000 mg by mouth every 6 (six) hours as needed. For pain         . HYDROCODONE-ACETAMINOPHEN 5-325 MG PO TABS   Oral   Take 1-2 tablets by mouth every 6 (six) hours as needed (pain).   30 tablet   0   . OMEPRAZOLE 20 MG PO CPDR   Oral   Take 20 mg by mouth daily.           BP 147/60  Pulse 95  Temp 98.7 F (37.1 C) (Oral)  Resp 18  Ht 6\' 2"  (1.88 m)  Wt 200 lb (90.719 kg)  BMI 25.68 kg/m2  SpO2 96%  Vital signs normal    Physical Exam  Nursing note and vitals reviewed. Constitutional: He is oriented to person, place, and time. He appears well-developed and well-nourished.  Non-toxic appearance. He does not appear ill. He appears distressed.       Patient appears uncomfortable  HENT:  Head: Normocephalic and atraumatic.  Right Ear: External ear normal.  Left Ear: External ear normal.  Nose: Nose normal. No mucosal edema or rhinorrhea.  Mouth/Throat: Oropharynx is clear and moist and mucous membranes are normal. No dental abscesses or uvula swelling.  Eyes: Conjunctivae normal and EOM are normal. Pupils are equal, round, and reactive to light. Scleral icterus is present.  Neck: Normal range of motion and full passive range of motion without pain. Neck supple.  Cardiovascular: Normal rate, regular rhythm and normal heart sounds.  Exam reveals no gallop and no friction rub.   No murmur  heard. Pulmonary/Chest: Effort normal and breath sounds normal. No respiratory distress. He has no wheezes. He has no rhonchi. He has no rales. He exhibits no tenderness and no crepitus.  Abdominal: Soft. Normal appearance and bowel sounds are normal. He exhibits no distension. There is tenderness. There is no rebound and no guarding.    Musculoskeletal: Normal range of motion. He exhibits no edema and no tenderness.       Moves all extremities well.   Neurological: He is alert and oriented to person, place, and time. He has normal strength. No cranial nerve deficit.  Skin: Skin is warm, dry and intact. No rash noted. No erythema. There is pallor.  Psychiatric: He has a normal mood and affect. His speech is normal and behavior is normal. His mood appears not anxious.    ED Course  Procedures (including critical care time)  Medications  omeprazole (PRILOSEC) 20 MG capsule (not administered)  acetaminophen (TYLENOL) 500 MG tablet (not administered)  0.9 %  sodium chloride infusion (0  Intravenous Stopped 07/10/12 0457)  ondansetron (ZOFRAN) injection 4 mg (4 mg Intravenous Given 07/10/12 0415)  fentaNYL (SUBLIMAZE) injection 50 mcg (50 mcg Intravenous Given 07/10/12 0416)  sodium chloride 0.9 % bolus 1,000 mL (0 mL Intravenous Stopped 07/10/12 0653)  HYDROmorphone (DILAUDID) injection 1 mg (1 mg Intravenous Given 07/10/12 0532)  ondansetron (ZOFRAN) injection 4 mg (4 mg Intravenous Given 07/10/12 0530)  Ampicillin-Sulbactam (UNASYN) 3 g in sodium chloride 0.9 % 100 mL IVPB (0 g Intravenous Stopped 07/10/12 0652)  HYDROmorphone (DILAUDID) injection 1 mg (1 mg Intravenous Given 07/10/12 2956)   Patient given IV pain medicine and started on IV antibiotics for possible retained stone with infection after cholecystectomy.  08 20 5 AM Dr. Darnelle Catalan admit to MedSurg Team 1 and request GI consult Dr. Rubin Payor will follow through on the GI consult at change of shift.  Results for orders placed during  the hospital encounter of 07/10/12  CBC WITH DIFFERENTIAL      Component Value Range   WBC 9.2  4.0 - 10.5 K/uL   RBC 4.02 (*) 4.22 - 5.81 MIL/uL   Hemoglobin 11.8 (*) 13.0 - 17.0 g/dL   HCT 21.3 (*) 08.6 - 57.8 %   MCV 86.3  78.0 - 100.0 fL   MCH 29.4  26.0 - 34.0 pg   MCHC 34.0  30.0 - 36.0 g/dL   RDW 46.9  62.9 - 52.8 %   Platelets 362  150 - 400 K/uL   Neutrophils Relative 90 (*) 43 - 77 %   Neutro Abs 8.3 (*) 1.7 - 7.7 K/uL   Lymphocytes Relative 5 (*) 12 -  46 %   Lymphs Abs 0.5 (*) 0.7 - 4.0 K/uL   Monocytes Relative 4  3 - 12 %   Monocytes Absolute 0.4  0.1 - 1.0 K/uL   Eosinophils Relative 0  0 - 5 %   Eosinophils Absolute 0.0  0.0 - 0.7 K/uL   Basophils Relative 0  0 - 1 %   Basophils Absolute 0.0  0.0 - 0.1 K/uL  COMPREHENSIVE METABOLIC PANEL      Component Value Range   Sodium 139  135 - 145 mEq/L   Potassium 3.1 (*) 3.5 - 5.1 mEq/L   Chloride 99  96 - 112 mEq/L   CO2 27  19 - 32 mEq/L   Glucose, Bld 148 (*) 70 - 99 mg/dL   BUN 13  6 - 23 mg/dL   Creatinine, Ser 1.61  0.50 - 1.35 mg/dL   Calcium 9.1  8.4 - 09.6 mg/dL   Total Protein 7.2  6.0 - 8.3 g/dL   Albumin 3.8  3.5 - 5.2 g/dL   AST 045 (*) 0 - 37 U/L   ALT 350 (*) 0 - 53 U/L   Alkaline Phosphatase 522 (*) 39 - 117 U/L   Total Bilirubin 5.6 (*) 0.3 - 1.2 mg/dL   GFR calc non Af Amer 87 (*) >90 mL/min   GFR calc Af Amer >90  >90 mL/min  LIPASE, BLOOD      Component Value Range   Lipase 51  11 - 59 U/L  TROPONIN I      Component Value Range   Troponin I <0.30  <0.30 ng/mL  URINALYSIS, ROUTINE W REFLEX MICROSCOPIC      Component Value Range   Color, Urine ORANGE (*) YELLOW   APPearance CLEAR  CLEAR   Specific Gravity, Urine 1.027  1.005 - 1.030   pH 7.0  5.0 - 8.0   Glucose, UA NEGATIVE  NEGATIVE mg/dL   Hgb urine dipstick SMALL (*) NEGATIVE   Bilirubin Urine MODERATE (*) NEGATIVE   Ketones, ur 15 (*) NEGATIVE mg/dL   Protein, ur 30 (*) NEGATIVE mg/dL   Urobilinogen, UA 4.0 (*) 0.0 - 1.0 mg/dL    Nitrite NEGATIVE  NEGATIVE   Leukocytes, UA SMALL (*) NEGATIVE  URINE MICROSCOPIC-ADD ON      Component Value Range   Squamous Epithelial / LPF FEW (*) RARE   WBC, UA 3-6  <3 WBC/hpf   RBC / HPF 3-6  <3 RBC/hpf   Bacteria, UA FEW (*) RARE   Urine-Other MUCOUS PRESENT     Laboratory interpretation all normal except hypokalemia, marked elevation of LFTs, borderline anemia, possible UTI    US Abdomen Complete  07/10/2012  *RADIOLOGY REPORT*  Clinical Data:  Recent cholecystectomy, still right upper quadrant pain with elevated LFTs and fever  COMPLETE ABDOMINAL ULTRASOUND  Comparison:  Operative cholangiogram of 05/18/2002 pain and CT abdomen pelvis of 05/03/2012  Findings:  Gallbladder:  The gallbladder has been surgically resected.  Common bile duct:  Common bile duct is prominent measuring 13.4 mm. This may be normal post cholecystectomy but correlation with LFTs is recommended.  Liver:  The liver has a normal echogenic pattern.  However there is slight prominence of the central intrahepatic ducts in this patient who has recently post cholecystectomy.  IVC:  Appears normal.  Pancreas:  No focal abnormality seen.  Spleen:  The spleen is enlarged measuring 14.0 cm sagittally.  Right Kidney:  No hydronephrosis is seen.  The right kidney measures 11.3 cm sagittally.  By history the right renal mass noted previously has been resected.  Left Kidney:  No hydronephrosis is noted.  The left kidney measures 13.6 cm.  Abdominal aorta:  Portions of the abdominal aorta are obscured but no aneurysm is seen.  IMPRESSION:  1.  Somewhat prominent central intrahepatic ducts and common bile duct most likely normal post cholecystectomy.  Correlate with LFTs. 2.  Splenomegaly. 3.  Interval cholecystectomy and apparent prior resection of right renal lesion.   Original Report Authenticated By: Dwyane Dee, M.D.      1. Transaminitis   2. Abdominal pain   3. Choledochitis     Plan admission  Devoria Albe, MD,  Armando Gang '  MDM          Ward Givens, MD 07/10/12 [phone]

## 2012-07-11 ENCOUNTER — Encounter (HOSPITAL_COMMUNITY)
Admission: EM | Disposition: A | Discharge status: Home / Self Care | Payer: Self-pay | Source: Home / Self Care | Attending: Internal Medicine

## 2012-07-11 ENCOUNTER — Observation Stay (HOSPITAL_COMMUNITY): Payer: Managed Care, Other (non HMO) | Admitting: Anesthesiology

## 2012-07-11 ENCOUNTER — Encounter (HOSPITAL_COMMUNITY): Payer: Self-pay

## 2012-07-11 ENCOUNTER — Inpatient Hospital Stay (HOSPITAL_COMMUNITY): Payer: Managed Care, Other (non HMO)

## 2012-07-11 ENCOUNTER — Encounter (HOSPITAL_COMMUNITY): Payer: Self-pay | Admitting: Anesthesiology

## 2012-07-11 DIAGNOSIS — N289 Disorder of kidney and ureter, unspecified: Secondary | ICD-10-CM

## 2012-07-11 DIAGNOSIS — R7989 Other specified abnormal findings of blood chemistry: Secondary | ICD-10-CM

## 2012-07-11 DIAGNOSIS — K8050 Calculus of bile duct without cholangitis or cholecystitis without obstruction: Principal | ICD-10-CM | POA: Diagnosis present

## 2012-07-11 DIAGNOSIS — D649 Anemia, unspecified: Secondary | ICD-10-CM

## 2012-07-11 HISTORY — PX: ERCP: SHX5425

## 2012-07-11 LAB — COMPREHENSIVE METABOLIC PANEL
ALT: 205 U/L — ABNORMAL HIGH (ref 0–53)
Albumin: 2.8 g/dL — ABNORMAL LOW (ref 3.5–5.2)
Alkaline Phosphatase: 415 U/L — ABNORMAL HIGH (ref 39–117)
Chloride: 101 mEq/L (ref 96–112)
Glucose, Bld: 111 mg/dL — ABNORMAL HIGH (ref 70–99)
Potassium: 3.8 mEq/L (ref 3.5–5.1)
Sodium: 134 mEq/L — ABNORMAL LOW (ref 135–145)
Total Bilirubin: 7.3 mg/dL — ABNORMAL HIGH (ref 0.3–1.2)
Total Protein: 5.6 g/dL — ABNORMAL LOW (ref 6.0–8.3)

## 2012-07-11 LAB — URINE CULTURE
Colony Count: NO GROWTH
Culture: NO GROWTH

## 2012-07-11 LAB — CBC
HCT: 30.8 % — ABNORMAL LOW (ref 39.0–52.0)
MCHC: 32.5 g/dL (ref 30.0–36.0)
MCV: 87.7 fL (ref 78.0–100.0)
Platelets: 253 10*3/uL (ref 150–400)
RDW: 13.8 % (ref 11.5–15.5)

## 2012-07-11 SURGERY — ERCP, WITH INTERVENTION IF INDICATED
Anesthesia: General

## 2012-07-11 MED ORDER — PROPOFOL 10 MG/ML IV EMUL
INTRAVENOUS | Status: DC | PRN
  Administered 2012-07-11: 140 ug/kg/min via INTRAVENOUS

## 2012-07-11 MED ORDER — ONDANSETRON HCL 4 MG/2ML IJ SOLN
INTRAMUSCULAR | Status: DC | PRN
  Administered 2012-07-11 (×2): 2 mg via INTRAVENOUS

## 2012-07-11 MED ORDER — MIDAZOLAM HCL 5 MG/5ML IJ SOLN
INTRAMUSCULAR | Status: DC | PRN
  Administered 2012-07-11 (×2): 1 mg via INTRAVENOUS

## 2012-07-11 MED ORDER — KETAMINE HCL 10 MG/ML IJ SOLN
INTRAMUSCULAR | Status: DC | PRN
  Administered 2012-07-11 (×4): 10 mg via INTRAVENOUS

## 2012-07-11 MED ORDER — FENTANYL CITRATE 0.05 MG/ML IJ SOLN
INTRAMUSCULAR | Status: DC | PRN
  Administered 2012-07-11 (×2): 50 ug via INTRAVENOUS

## 2012-07-11 MED ORDER — SODIUM CHLORIDE 0.9 % IV SOLN
INTRAVENOUS | Status: DC | PRN
  Administered 2012-07-11: 14:00:00

## 2012-07-11 MED ORDER — LACTATED RINGERS IV SOLN
INTRAVENOUS | Status: DC | PRN
  Administered 2012-07-11: 13:00:00 via INTRAVENOUS

## 2012-07-11 MED ORDER — DEXAMETHASONE SODIUM PHOSPHATE 4 MG/ML IJ SOLN
INTRAMUSCULAR | Status: DC | PRN
  Administered 2012-07-11: 10 mg via INTRAVENOUS

## 2012-07-11 MED ORDER — SODIUM CHLORIDE 0.9 % IV SOLN: INTRAVENOUS | Status: DC

## 2012-07-11 NOTE — Progress Notes (Signed)
Assessment unchanged. VSS since return from ERCP. Denies pain. "Feeling good and feeling hungry," stated pt. Dc instructions given. Pt able to teach back follow care with Dr. Arlyce Dice and what symptoms to notify MD about if occurs. No scripts provided. Pt understands home meds to resume. Pt already has My Chart account. MD provided information regarding recent dx. Discharged via wheelchair accompanied by wife and NT to front entrance to meet awaiting vehicle to carry home.

## 2012-07-11 NOTE — Anesthesia Postprocedure Evaluation (Signed)
  Anesthesia Post-op Note  Patient: Kevin Doyle  Procedure(s) Performed: Procedure(s) (LRB): ENDOSCOPIC RETROGRADE CHOLANGIOPANCREATOGRAPHY (ERCP) (N/A)  Patient Location: PACU  Anesthesia Type: MAC  Level of Consciousness: awake and alert   Airway and Oxygen Therapy: Patient Spontanous Breathing  Post-op Pain: mild  Post-op Assessment: Post-op Vital signs reviewed, Patient's Cardiovascular Status Stable, Respiratory Function Stable, Patent Airway and No signs of Nausea or vomiting  Last Vitals:  Filed Vitals:   07/11/12 1508  BP: 132/66  Pulse:   Temp:   Resp: 18    Post-op Vital Signs: stable   Complications: No apparent anesthesia complications

## 2012-07-11 NOTE — Transfer of Care (Signed)
Immediate Anesthesia Transfer of Care Note  Patient: Kevin Doyle  Procedure(s) Performed: Procedure(s) (LRB) with comments: ENDOSCOPIC RETROGRADE CHOLANGIOPANCREATOGRAPHY (ERCP) (N/A)  Patient Location: PACU  Anesthesia Type:MAC  Level of Consciousness: awake, alert , oriented and patient cooperative  Airway & Oxygen Therapy: Patient Spontanous Breathing and Patient connected to nasal cannula oxygen  Post-op Assessment: Report given to PACU RN and Post -op Vital signs reviewed and stable  Post vital signs: Reviewed and stable  Complications: No apparent anesthesia complications

## 2012-07-11 NOTE — Progress Notes (Signed)
ERCP demonstrated retained bile duct stones which were moved following sphincterotomy.  Recommendations #1 discontinue antibiotics #2 if patient wakes up and feels well he can be discharged later today

## 2012-07-11 NOTE — Op Note (Signed)
[name] [address] [place], [address]   ERCP PROCEDURE REPORT  PATIENT: Kevin Doyle, Kevin Doyle.  MR# :[id number] BIRTHDATE: 1951/03/07  GENDER: Male ENDOSCOPIST: Louis Meckel, MD REFERRED BY: Rudi Heap, M.D.  Luretha Murphy, M.D. PROCEDURE DATE:  07/11/2012 PROCEDURE:   ERCP with sphincterotomy/papillotomy and ERCP with removal of calculus/calculi ASA CLASS:   Class II INDICATIONS:suspected or rule out bile duct stones. MEDICATIONS: MAC sedation, administered by CRNA TOPICAL ANESTHETIC:  DESCRIPTION OF PROCEDURE:   After the risks benefits and alternatives of the procedure were thoroughly explained, informed consent was obtained.  The     endoscope was introduced through the mouth  and advanced to the second portion of the duodenum .  1.  The common bile duct was directly cannulated.  Cholangiogram demonstrated at least one 10-12 mm filling defect.  A 12-15 mm sphincterotomy cut was made.  The duct was swept multiple times with a 12 and 15 mm balloon stone extraction.  There was a persistent filling defect in the distal duct.  A wire mesh basket was inserted into the duct and a single large stone was extracted. The duct was then swept several more times with the balloon stone extractor and no further stones were delivered into the duodenum. Final occlusion cholangiogram was normal.  This simultaneously injected the pancreatic duct which did not demonstrate any abnormalities.  The scope was then completely withdrawn from the patient and the procedure terminated.     COMPLICATIONS:  ENDOSCOPIC IMPRESSION: Choledocholithiasis-status post sphincterotomy and stone extraction  RE COMMENDATIONS: Okay to discontinue antibiotics     _______________________________ eSigned:  Louis Meckel, MD 07/11/2012 2:19 PM   CC:

## 2012-07-11 NOTE — Anesthesia Preprocedure Evaluation (Addendum)
Anesthesia Evaluation  Patient identified by MRN, date of birth, ID band Patient awake    Reviewed: Allergy & Precautions, H&P , NPO status , Patient's Chart, lab work & pertinent test results  Airway Mallampati: II TM Distance: >3 FB Neck ROM: full    Dental No notable dental hx. (+) Teeth Intact and Dental Advisory Given   Pulmonary neg pulmonary ROS,  breath sounds clear to auscultation  Pulmonary exam normal       Cardiovascular Exercise Tolerance: Good negative cardio ROS  Rhythm:regular Rate:Normal     Neuro/Psych negative neurological ROS  negative psych ROS   GI/Hepatic negative GI ROS, Neg liver ROS, GERD-  Medicated and Controlled,Gilbert's disease   Endo/Other  negative endocrine ROS  Renal/GU negative Renal ROS  negative genitourinary   Musculoskeletal   Abdominal   Peds  Hematology negative hematology ROS (+) anemia ,   Anesthesia Other Findings   Reproductive/Obstetrics negative OB ROS                          Anesthesia Physical Anesthesia Plan  ASA: II  Anesthesia Plan: MAC   Post-op Pain Management:    Induction:   Airway Management Planned: Simple Face Mask  Additional Equipment:   Intra-op Plan:   Post-operative Plan:   Informed Consent: I have reviewed the patients History and Physical, chart, labs and discussed the procedure including the risks, benefits and alternatives for the proposed anesthesia with the patient or authorized representative who has indicated his/her understanding and acceptance.   Dental Advisory Given  Plan Discussed with: CRNA and Surgeon  Anesthesia Plan Comments:        Anesthesia Quick Evaluation

## 2012-07-11 NOTE — Discharge Summary (Signed)
Physician Discharge Summary  [name] YNW:[id number] DOB: 1950/08/24 DOA: 07/10/2012  PCP: Rudi Heap, MD  Admit date: 07/10/2012 Discharge date: 07/11/2012  Recommendations for Outpatient Follow-up  Follow-up Information    Follow up with Melvia Heaps, MD. Schedule an appointment as soon as possible for a visit in 2 weeks. (As needed)    Contact information:   [address] [place] [address] [phone]         Discharge Diagnoses:  1. Choledocolithiasis . Renal mass, right  . S/P laparoscopic cholecystectomy Nove 2013  . Normocytic anemia  . Elevated LFTs    Discharge Condition: stable Disposition: Discharged home  Diet recommendation: regular diet  Filed Weights   07/10/12 0345  Weight: 200 lb (90.719 kg)    History of present illness: Kevin Doyle is an 61 y.o. male with a PMH of recent cholecystecomy (05/18/12) by Dr. Daphine Deutscher. Post-operative course was characterized by intermittent abdominal pain associated with fever, the first episode was 4 days ago, and he has had 1-2 episodes daily since that time. Pain comes on suddenly, lasts 1-2 hours, and feels like the type of pain he was having before his gallbladder was removed. Pain is lower sternal area. No association of the pain with meal intake. Has had some intermittent N/V as well. No melena or hematochezia. Stools soft and loose since cholecystectomy. Has had low grade fever as well. He had an abdominal ultrasound on admission which showed prominence of the intrahepatic ducts and slight dilatation of the common bile duct    Hospital Course:  Assessment/Plan:  Choledocholithiasis: s/p cholecystectomy on 05/18/12 by Dr. Daphine Deutscher. He denies any abdominal pain, nausea and vomiting currently. ERCP done today by Dr. Arlyce Dice to evaluate for retained stones and stones were succesfully pulled out of the CBD. Patient is able to be discharged home as per Dr Arlyce Dice.  Elevated LFTs: see #1. LFT's  are trending down with fluids. Of note they were completely normal before surgery.   Hypokalemia: Resolved  Elevated BP: Bp stable. Metoprolol PRN for SBP >160   Normocytic anemia: His Hb has dropped from 11.8 yesterday to 10.0 likely 2/2 hemodilution from IV fluids.Continue to monitor.   Discharge Instructions      Discharge Orders    Future Orders Please Complete By Expires   Diet general      Increase activity slowly      Discharge instructions      Comments:   Please contact Dr. Marzetta Board office for a follow up appointment in 1-2 weeks time.   Call MD for:  persistant nausea and vomiting      Call MD for:  severe uncontrolled pain          Medication List     As of 07/11/2012  3:21 PM    STOP taking these medications         HYDROcodone-acetaminophen 5-325 MG per tablet   Commonly known as: NORCO/VICODIN      TAKE these medications         acetaminophen 500 MG tablet   Commonly known as: TYLENOL   Take 500-1,000 mg by mouth every 6 (six) hours as needed. For pain      omeprazole 20 MG capsule   Commonly known as: PRILOSEC   Take 20 mg by mouth daily.        Follow-up Information    Follow up with Melvia Heaps, MD. Schedule an appointment as soon as possible for a  visit in 2 weeks. (As needed)    Contact information:   [address] [place] [address] [phone]           The results of significant diagnostics from this hospitalization (including imaging, microbiology, ancillary and laboratory) are listed below for reference.    Significant Diagnostic Studies: US Abdomen Complete  07/10/2012  *RADIOLOGY REPORT*  Clinical Data:  Recent cholecystectomy, still right upper quadrant pain with elevated LFTs and fever  COMPLETE ABDOMINAL ULTRASOUND  Comparison:  Operative cholangiogram of 05/18/2002 pain and CT abdomen pelvis of 05/03/2012  Findings:  Gallbladder:  The gallbladder has been surgically resected.  Common bile  duct:  Common bile duct is prominent measuring 13.4 mm. This may be normal post cholecystectomy but correlation with LFTs is recommended.  Liver:  The liver has a normal echogenic pattern.  However there is slight prominence of the central intrahepatic ducts in this patient who has recently post cholecystectomy.  IVC:  Appears normal.  Pancreas:  No focal abnormality seen.  Spleen:  The spleen is enlarged measuring 14.0 cm sagittally.  Right Kidney:  No hydronephrosis is seen.  The right kidney measures 11.3 cm sagittally.  By history the right renal mass noted previously has been resected.  Left Kidney:  No hydronephrosis is noted.  The left kidney measures 13.6 cm.  Abdominal aorta:  Portions of the abdominal aorta are obscured but no aneurysm is seen.  IMPRESSION:  1.  Somewhat prominent central intrahepatic ducts and common bile duct most likely normal post cholecystectomy.  Correlate with LFTs. 2.  Splenomegaly. 3.  Interval cholecystectomy and apparent prior resection of right renal lesion.   Original Report Authenticated By: Dwyane Dee, M.D.    Dg Ercp With Sphincterotomy  07/11/2012  *RADIOLOGY REPORT*  Clinical Data: The common bile duct stones.  Technique:  Multiple spot images obtained with the fluoroscopic device and submitted for interpretation post-procedure.  ERCP was performed by Dr. Arlyce Dice.  Findings: The initial images demonstrate multiple filling defects suggesting small stones.  The balloon was passed through the common bile duct.  There is intermittent filling of the remnant cystic duct.  The patient is status post cholecystectomy.  Contrast does flushed into the pancreatic duct on delayed images.  Contrast can also be seen extending into the duodenum.  IMPRESSION:  1.  Small filling defects initially in the common bile duct likely represent to a stones. 2.  Following sweep of a balloon, no significant residual filling defects are present.  These images were submitted for radiologic  interpretation only. Please see the procedural report for the amount of contrast and the fluoroscopy time utilized.   Original Report Authenticated By: Marin Roberts, M.D.     Microbiology: Recent Results (from the past 240 hour(s))  URINE CULTURE     Status: Normal   Collection Time   07/10/12  4:20 AM      Component Value Range Status Comment   Specimen Description URINE, CLEAN CATCH   Final    Special Requests NONE   Final    Culture  Setup Time 07/10/2012 09:52   Final    Colony Count NO GROWTH   Final    Culture NO GROWTH   Final    Report Status 07/11/2012 FINAL   Final      Labs: Basic Metabolic Panel:  Lab 07/11/12 9147 07/10/12 0405  NA 134* 139  K 3.8 3.1*  CL 101 99  CO2 27  27  GLUCOSE 111* 148*  BUN 13 13  CREATININE 1.20 0.99  CALCIUM 8.5 9.1  MG -- --  PHOS -- --   Liver Function Tests:  Lab 07/11/12 0515 07/10/12 0405  AST 67* 177*  ALT 205* 350*  ALKPHOS 415* 522*  BILITOT 7.3* 5.6*  PROT 5.6* 7.2  ALBUMIN 2.8* 3.8    Lab 07/10/12 0405  LIPASE 51  AMYLASE --   No results found for this basename: AMMONIA:5 in the last 168 hours CBC:  Lab 07/11/12 0515 07/10/12 0405  WBC 6.3 9.2  NEUTROABS -- 8.3*  HGB 10.0* 11.8*  HCT 30.8* 34.7*  MCV 87.7 86.3  PLT 253 362   Cardiac Enzymes:  Lab 07/10/12 0405  CKTOTAL --  CKMB --  CKMBINDEX --  TROPONINI <0.30   BNP: BNP (last 3 results) No results found for this basename: PROBNP:3 in the last 8760 hours CBG: No results found for this basename: GLUCAP:5 in the last 168 hours  Active Problems:  Normocytic anemia  Elevated LFTs   Time coordinating discharge: 35 Minutes  Signed: Lars Mage MD [facility] 07/11/2012, 3:21 PM

## 2012-07-12 ENCOUNTER — Encounter (HOSPITAL_COMMUNITY): Payer: Self-pay | Admitting: Gastroenterology

## 2012-07-23 ENCOUNTER — Other Ambulatory Visit (INDEPENDENT_AMBULATORY_CARE_PROVIDER_SITE_OTHER): Payer: Managed Care, Other (non HMO)

## 2012-07-23 ENCOUNTER — Encounter: Payer: Self-pay | Admitting: Gastroenterology

## 2012-07-23 ENCOUNTER — Ambulatory Visit (INDEPENDENT_AMBULATORY_CARE_PROVIDER_SITE_OTHER): Payer: Managed Care, Other (non HMO) | Admitting: Gastroenterology

## 2012-07-23 VITALS — BP 148/72 | HR 76 | Ht 74.0 in | Wt 209.0 lb

## 2012-07-23 DIAGNOSIS — K8020 Calculus of gallbladder without cholecystitis without obstruction: Secondary | ICD-10-CM

## 2012-07-23 DIAGNOSIS — K9186 Retained cholelithiasis following cholecystectomy: Secondary | ICD-10-CM

## 2012-07-23 DIAGNOSIS — Y836 Removal of other organ (partial) (total) as the cause of abnormal reaction of the patient, or of later complication, without mention of misadventure at the time of the procedure: Secondary | ICD-10-CM

## 2012-07-23 DIAGNOSIS — R7989 Other specified abnormal findings of blood chemistry: Secondary | ICD-10-CM

## 2012-07-23 LAB — HEPATIC FUNCTION PANEL
AST: 20 U/L (ref 0–37)
Albumin: 3.7 g/dL (ref 3.5–5.2)
Total Bilirubin: 1.7 mg/dL — ABNORMAL HIGH (ref 0.3–1.2)

## 2012-07-23 NOTE — Progress Notes (Signed)
This is a 62 year old Caucasian male who had recent ERCP with extraction of common bile ducts time by Dr. Melvia Heaps on December 26.  Center with abdominal pain and jaundice, was hospitalized for several days, but is currently asymptomatic.  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  Currently the patient denies jaundice, pruritus, clay colored stools, diarrhea, reflux, nausea vomiting, anorexia or weight loss.  Current Medications, Allergies, Past Medical History, Past Surgical History, Family History and Social History were reviewed in [employer] record.  Pertinent Review of Systems Negative   Physical Exam: Blood pressure 140/72, pulse 76 and regular, and weight 209 with a BMI of 26.83.  An appreciate jaundice or stigmata of chronic liver disease.  His abdomen shows multiple laparoscopic incisions but there is no organomegaly, abdominal masses or tenderness.  Bowel sounds are normal.  Mental status is normal    Assessment and Plan: Status post resection for benign renal tumor, laparoscopic cholecystectomy, and endoscopic sphincterotomy of her retained common bile duct stone.  Patient denies any GI complaints at this time.  P. liver profile ordered for review.  If you develop some bile- salt enteropathy or other GI complaints, he is to call as needed. Encounter Diagnosis  Name Primary?  . Elevated LFTs Yes

## 2012-07-23 NOTE — Patient Instructions (Addendum)
Your physician has requested that you go to the basement for the following lab work before leaving today: Liver Test CC:  Rudi Heap MD

## 2012-08-21 ENCOUNTER — Encounter (INDEPENDENT_AMBULATORY_CARE_PROVIDER_SITE_OTHER): Payer: Self-pay

## 2012-08-31 ENCOUNTER — Other Ambulatory Visit: Payer: Self-pay

## 2013-02-10 ENCOUNTER — Emergency Department (HOSPITAL_COMMUNITY)
Admission: EM | Admit: 2013-02-10 | Discharge: 2013-02-10 | Disposition: A | Discharge status: Home / Self Care | Payer: Managed Care, Other (non HMO) | Attending: Emergency Medicine | Admitting: Emergency Medicine

## 2013-02-10 ENCOUNTER — Encounter (HOSPITAL_COMMUNITY): Payer: Self-pay | Admitting: Emergency Medicine

## 2013-02-10 ENCOUNTER — Ambulatory Visit (INDEPENDENT_AMBULATORY_CARE_PROVIDER_SITE_OTHER): Payer: Managed Care, Other (non HMO) | Admitting: Family Medicine

## 2013-02-10 ENCOUNTER — Emergency Department (HOSPITAL_COMMUNITY): Payer: Managed Care, Other (non HMO)

## 2013-02-10 ENCOUNTER — Ambulatory Visit: Payer: Self-pay | Admitting: Family Medicine

## 2013-02-10 ENCOUNTER — Encounter: Payer: Self-pay | Admitting: Family Medicine

## 2013-02-10 VITALS — BP 92/58 | HR 108 | Temp 97.3°F | Wt 200.4 lb

## 2013-02-10 DIAGNOSIS — I951 Orthostatic hypotension: Secondary | ICD-10-CM | POA: Insufficient documentation

## 2013-02-10 DIAGNOSIS — I4891 Unspecified atrial fibrillation: Secondary | ICD-10-CM | POA: Insufficient documentation

## 2013-02-10 DIAGNOSIS — R112 Nausea with vomiting, unspecified: Secondary | ICD-10-CM | POA: Insufficient documentation

## 2013-02-10 DIAGNOSIS — Z7982 Long term (current) use of aspirin: Secondary | ICD-10-CM | POA: Insufficient documentation

## 2013-02-10 DIAGNOSIS — Z8249 Family history of ischemic heart disease and other diseases of the circulatory system: Secondary | ICD-10-CM | POA: Insufficient documentation

## 2013-02-10 DIAGNOSIS — Z87891 Personal history of nicotine dependence: Secondary | ICD-10-CM | POA: Insufficient documentation

## 2013-02-10 DIAGNOSIS — Z8719 Personal history of other diseases of the digestive system: Secondary | ICD-10-CM | POA: Insufficient documentation

## 2013-02-10 DIAGNOSIS — R42 Dizziness and giddiness: Secondary | ICD-10-CM | POA: Insufficient documentation

## 2013-02-10 LAB — BASIC METABOLIC PANEL
BUN: 17 mg/dL (ref 6–23)
CO2: 23 mEq/L (ref 19–32)
Chloride: 107 mEq/L (ref 96–112)
Creatinine, Ser: 0.96 mg/dL (ref 0.50–1.35)
Glucose, Bld: 109 mg/dL — ABNORMAL HIGH (ref 70–99)
Potassium: 4 mEq/L (ref 3.5–5.1)

## 2013-02-10 LAB — CBC
HCT: 34.4 % — ABNORMAL LOW (ref 39.0–52.0)
Hemoglobin: 12.3 g/dL — ABNORMAL LOW (ref 13.0–17.0)
MCV: 86.9 fL (ref 78.0–100.0)
RBC: 3.96 MIL/uL — ABNORMAL LOW (ref 4.22–5.81)
WBC: 8.5 10*3/uL (ref 4.0–10.5)

## 2013-02-10 MED ORDER — DILTIAZEM HCL 60 MG PO TABS
60.0000 mg | ORAL_TABLET | Freq: Once | ORAL | Status: AC
  Administered 2013-02-10: 60 mg via ORAL
  Filled 2013-02-10: qty 1

## 2013-02-10 MED ORDER — DILTIAZEM HCL ER COATED BEADS 240 MG PO TB24: 240.0000 mg | ORAL_TABLET | Freq: Every day | ORAL | Status: DC

## 2013-02-10 MED ORDER — SODIUM CHLORIDE 0.9 % IV BOLUS (SEPSIS)
1000.0000 mL | Freq: Once | INTRAVENOUS | Status: AC
  Administered 2013-02-10: 1000 mL via INTRAVENOUS

## 2013-02-10 MED ORDER — DILTIAZEM LOAD VIA INFUSION
20.0000 mg | Freq: Once | INTRAVENOUS | Status: AC
  Administered 2013-02-10 (×2): 20 mg via INTRAVENOUS
  Filled 2013-02-10: qty 20

## 2013-02-10 MED ORDER — SODIUM CHLORIDE 0.9 % IV SOLN: INTRAVENOUS | Status: DC

## 2013-02-10 MED ORDER — IV CATHETER KIT: PACK | Freq: Once | Status: DC

## 2013-02-10 MED ORDER — DILTIAZEM HCL 100 MG IV SOLR
5.0000 mg/h | INTRAVENOUS | Status: DC
  Administered 2013-02-10: 10 mg/h via INTRAVENOUS

## 2013-02-10 NOTE — ED Notes (Signed)
[facility] presents with a 62 yo male transported from physician's office with what they diagnosed as atrial fibrillation.  Pt woke up around 5-5:30 am with dizziness upon awakening.  Pt became nauseated, dry heave and vomited some and then reported heart felt funny.  Pt visited physician's office around 2:30 pm today, IV was started in office because of symptoms and called 911 for transport to this facility.  One previous episode 15 years ago.  Family hx of atrial fib.  [facility] reports report orthostatic hypotension from lying to sitting with normalization in the standing position.

## 2013-02-10 NOTE — ED Provider Notes (Signed)
CSN: [id number]     Arrival date & time 02/10/13  1744 History     First MD Initiated Contact with Patient 02/10/13 1750     Chief Complaint  Patient presents with  . Atrial Fibrillation   (Consider location/radiation/quality/duration/timing/severity/associated sxs/prior Treatment) HPI Comments: 62 y/o male with a PMHx of GERD and Gilbert's disease presents to the ED via EMS from his PCP's office with new-onset atrial fibrillation. Patient states this morning when he woke up around 5:00 am he was dizzy, thought it was his vertigo, however shortly after became nauseous, started dry-heaving with some vomiting. After vomiting, he felt his heart "beating abnormally". Went to PCP at 2:30 pm today, had EKG and was found to be in a-fib. EMS was called to transport to ED. EMS states patient had orthostatic hypotension. Patient states about 15 years ago he had a similar episode where he got dizzy, his heart was "beating abnormally", however did not seek treatment. Has never seen a cardiologist. No hx of heart problems. Positive family hx of afib. Currently patient denies dizziness, lightheadedness, chest pain, sob, nausea or vomiting. Dizziness returns slightly if he sits up fast.  Patient is a 62 y.o. male presenting with atrial fibrillation. The history is provided by the patient and the spouse.  Atrial Fibrillation Associated symptoms include nausea and vomiting. Pertinent negatives include no chest pain or diaphoresis.    Past Medical History  Diagnosis Date  . GERD (gastroesophageal reflux disease)   . Gilbert's disease    Past Surgical History  Procedure Laterality Date  . Hernia repairs      x 3   . Colonoscopy    . Upper gastrointestinal endoscopy    . Inguinal hernia repair  1986  . Cholecystectomy  05/18/2012    Procedure: LAPAROSCOPIC CHOLECYSTECTOMY WITH INTRAOPERATIVE CHOLANGIOGRAM;  Surgeon: Valarie Merino, MD;  Location: [facility];  Service: General;  Laterality: N/A;  . Robotic  assited partial nephrectomy  06/24/2012    Procedure: ROBOTIC ASSITED PARTIAL NEPHRECTOMY;  Surgeon: Crecencio Mc, MD;  Location: [facility];  Service: Urology;  Laterality: Right;  . Laparoscopic lysis of adhesions  06/24/2012    Procedure: LAPAROSCOPIC LYSIS OF ADHESIONS;  Surgeon: Crecencio Mc, MD;  Location: [facility];  Service: Urology;;  . Ercp  07/11/2012    Procedure: ENDOSCOPIC RETROGRADE CHOLANGIOPANCREATOGRAPHY (ERCP);  Surgeon: Louis Meckel, MD;  Location: [facility];  Service: Endoscopy;  Laterality: N/A;   Family History  Problem Relation Age of Onset  . Colon cancer Neg Hx   . Lung cancer Mother   . Brain cancer Father    History  Substance Use Topics  . Smoking status: Former Games developer  . Smokeless tobacco: Former Neurosurgeon    Quit date: 06/19/1986     Comment: Quit at age 40   . Alcohol Use: Yes     Comment: Rare-Beer     Review of Systems  Constitutional: Negative for diaphoresis.  Respiratory: Negative for shortness of breath.   Cardiovascular: Positive for palpitations. Negative for chest pain.  Gastrointestinal: Positive for nausea and vomiting.  Neurological: Positive for dizziness and light-headedness. Negative for syncope.  Psychiatric/Behavioral: Negative for confusion.  All other systems reviewed and are negative.    Allergies  Review of patient's allergies indicates no known allergies.  Home Medications   Current Outpatient Rx  Name  Route  Sig  Dispense  Refill  . aspirin 81 MG tablet   Oral   Take 81 mg by mouth daily.  BP 123/88  Pulse 146  Temp(Src) 98.2 F (36.8 C) (Oral)  Resp 14  SpO2 97% Physical Exam  Nursing note and vitals reviewed. Constitutional: He is oriented to person, place, and time. He appears well-developed and well-nourished. No distress.  HENT:  Head: Normocephalic and atraumatic.  Mouth/Throat: Oropharynx is clear and moist.  Eyes: Conjunctivae and EOM are normal. Pupils are equal, round, and reactive to light.   Neck: Normal range of motion. Neck supple.  Cardiovascular: Intact distal pulses.  An irregularly irregular rhythm present. Tachycardia present.   No extremity edema.  Pulmonary/Chest: Effort normal and breath sounds normal.  Abdominal: Soft. Bowel sounds are normal. He exhibits no distension. There is no tenderness.  Musculoskeletal: Normal range of motion. He exhibits no edema.  Neurological: He is alert and oriented to person, place, and time. He has normal strength. No sensory deficit.  Skin: Skin is warm and dry. He is not diaphoretic.  Psychiatric: He has a normal mood and affect. His behavior is normal.    ED Course   Procedures (including critical care time)   Date: 02/10/2013  Rate: 128  Rhythm: atrial fibrillation  QRS Axis: normal  Intervals: normal  ST/T Wave abnormalities: normal  Conduction Disutrbances:none  Narrative Interpretation: a-fib, no onset, prior EKG 05/18/2012 NSR, normal  Old EKG Reviewed: changes noted a-fib 8:00 PM   Date: 02/10/2013  Rate: 77  Rhythm: atrial fibrillation  QRS Axis: normal  Intervals: normal  ST/T Wave abnormalities: normal  Conduction Disutrbances:none  Narrative Interpretation: a-fib  Old EKG Reviewed: changes noted no tachycardia   Labs Reviewed  CBC - Abnormal; Notable for the following:    RBC 3.96 (*)    Hemoglobin 12.3 (*)    HCT 34.4 (*)    All other components within normal limits  BASIC METABOLIC PANEL - Abnormal; Notable for the following:    Glucose, Bld 109 (*)    GFR calc non Af Amer 88 (*)    All other components within normal limits  POCT I-STAT TROPONIN I    Dg Chest 2 View  02/10/2013   *RADIOLOGY REPORT*  Clinical Data: New onset atrial fibrillation.  CHEST - 2 VIEW  Comparison: Two-view chest x-ray 05/14/2012.  Findings: Cardiomediastinal silhouette unremarkable.  Lungs clear. Bronchovascular markings normal.  Pulmonary vascularity normal.  No pleural effusions.  No pneumothorax.  Visualized bony  thorax intact.  No significant interval change.  IMPRESSION: Normal and stable examination.   Original Report Authenticated By: Hulan Saas, M.D.   1. Atrial fibrillation     MDM  Patient with new-onset a-fib. He appears in NAD. Tachycardic. PE unremarkable other than irregularly irregular tachycardic rhythm. Will give cardizem 20mg  IV given plus infusion. Cbc, bmp, troponin, CXR. Will consult cardiology for new onset afib. Patient discussed with Dr. Jeraldine Loots who agrees with plan of care. 7:30 PM Patient still in a-fib, no longer tachycardic. HR 82 on my re-evaluation. I spoke with [facility] cardiology, who feels patient can be discharged home with cardizem. Initial 60mg  PO given tonight. Corinda Gubler will call patient tomorrow to schedule appt. Close return precautions discussed. Patient states understanding of plan and is agreeable.   Trevor Mace, PA-C 02/10/13 2001

## 2013-02-10 NOTE — Progress Notes (Signed)
Patient ID: Kevin Doyle, male   DOB: 08-04-1950, 62 y.o.   MRN: [id number] SUBJECTIVE: CC: Chief Complaint  Patient presents with  . Dizziness    c/o dizzy started this am and stated heart does not feel like in regular rhythm    HPI: Dizzy light headed felt like he was going to pass out then his heart started to beat irregularly.and then he vomited. The last time he had a similar episode was 15 years ago. No headache. No focal neurologic symptoms. Couldn't eat all day today. Meds: usually a baby aspirin. No chest pain. Strong family history of irregular heart beat per patient.  Past Medical History  Diagnosis Date  . GERD (gastroesophageal reflux disease)   . Gilbert's disease    Past Surgical History  Procedure Laterality Date  . Hernia repairs      x 3   . Colonoscopy    . Upper gastrointestinal endoscopy    . Inguinal hernia repair  1986  . Cholecystectomy  05/18/2012    Procedure: LAPAROSCOPIC CHOLECYSTECTOMY WITH INTRAOPERATIVE CHOLANGIOGRAM;  Surgeon: Valarie Merino, MD;  Location: [facility];  Service: General;  Laterality: N/A;  . Robotic assited partial nephrectomy  06/24/2012    Procedure: ROBOTIC ASSITED PARTIAL NEPHRECTOMY;  Surgeon: Crecencio Mc, MD;  Location: [facility];  Service: Urology;  Laterality: Right;  . Laparoscopic lysis of adhesions  06/24/2012    Procedure: LAPAROSCOPIC LYSIS OF ADHESIONS;  Surgeon: Crecencio Mc, MD;  Location: [facility];  Service: Urology;;  . Ercp  07/11/2012    Procedure: ENDOSCOPIC RETROGRADE CHOLANGIOPANCREATOGRAPHY (ERCP);  Surgeon: Louis Meckel, MD;  Location: [facility];  Service: Endoscopy;  Laterality: N/A;   History   Social History  . Marital Status: Married    Spouse Name: N/A    Number of Children: N/A  . Years of Education: N/A   Occupational History  . Not on file.   Social History Main Topics  . Smoking status: Former Games developer  . Smokeless tobacco: Former Neurosurgeon    Quit date: 06/19/1986     Comment: Quit at age 71   .  Alcohol Use: Yes     Comment: Rare-Beer   . Drug Use: No  . Sexually Active: Not on file   Other Topics Concern  . Not on file   Social History Narrative   Married.  Independent of ADLs.  2 cups of coffee daily    Family History  Problem Relation Age of Onset  . Colon cancer Neg Hx   . Lung cancer Mother   . Brain cancer Father    Current Outpatient Prescriptions on File Prior to Visit  Medication Sig Dispense Refill  . aspirin 81 MG tablet Take 81 mg by mouth daily.       No current facility-administered medications on file prior to visit.   No Known Allergies  There is no immunization history on file for this patient. Prior to Admission medications   Medication Sig Start Date End Date Taking? Authorizing Provider  aspirin 81 MG tablet Take 81 mg by mouth daily.   Yes Historical Provider, MD     ROS: As above in the HPI. All other systems are stable or negative.  OBJECTIVE: APPEARANCE:  Patient in no acute distress.The patient appeared well nourished and normally developed. Acyanotic. Waist: VITAL SIGNS:BP 92/58  Pulse 108  Temp(Src) 97.3 F (36.3 C) (Oral)  Wt 200 lb 6.4 oz (90.901 kg)  BMI 25.72 kg/m2  WM NAD  Tilt positive 105/71  sitting and drops to 92/58 and later to 69 systolic  IV to be  Started and transported to [facility] with O2   SKIN: warm and  Dry without overt rashes, tattoos and scars  HEAD and Neck: without JVD, Head and scalp: normal Eyes:No scleral icterus. Fundi normal, eye movements normal. Ears: Auricle normal, canal normal, Tympanic membranes normal, insufflation normal. Nose: normal Throat: normal Neck & thyroid: normal  CHEST & LUNGS: Chest wall: normal Lungs: Clear  CVS: Reveals the PMI to be normally located. IRRegular rhythm,HR 108-120. First and Second Heart sounds are normal,  absence of murmurs, rubs or gallops. Peripheral vasculature: Radial pulses: abnormal: irregular  ABDOMEN:  Appearance: normal Benign, no  organomegaly, no masses, no Abdominal Aortic enlargement. No Guarding , no rebound. No Bruits. Bowel sounds: normal  RECTAL: N/A GU: N/A  EXTREMETIES: nonedematous. Both Femoral and Pedal pulses are normal.  MUSCULOSKELETAL:  Spine: normal Joints: intact  NEUROLOGIC: oriented to time,place and person; nonfocal. Strength is normal Sensory is normal Reflexes are normal Cranial Nerves are normal.  ASSESSMENT: Dizzy - Plan: EKG 12-Lead  Atrial fibrillation  Orthostatic hypotension  Family history of arrhythmia  Possibly this is all due to volume  Depletion  PLAN: IV O2 Transport vis Ambulance/EMS to [facility] Charge nurse informed.  Orders Placed This Encounter  Procedures  . EKG 12-Lead   Return if symptoms worsen or fail to improve, for transported to ED via EMS.  Adria Costley P. [place] Charon, M.D.

## 2013-02-12 NOTE — ED Provider Notes (Signed)
  This was a shared visit with a mid-level provided (NP or PA).  Throughout the patient's course I was available for consultation/collaboration.  I saw the ECG (if appropriate), relevant labs and studies - I agree with the interpretation.  On my exam the patient was in no distress.  However, the patient was notably in new atrial fibrillation.  Patient had substantial improvement with diltiazem bolus and drip.  After discussion with cardiology, the patient was advised on appropriate followup, tomorrow, and per cardiology was discharged with that instruction.  Gerhard Munch, MD 02/12/13 [phone]

## 2013-04-04 ENCOUNTER — Encounter: Payer: Self-pay | Admitting: *Deleted

## 2013-04-10 ENCOUNTER — Encounter: Payer: Managed Care, Other (non HMO) | Admitting: Cardiovascular Disease

## 2013-04-23 ENCOUNTER — Other Ambulatory Visit (HOSPITAL_COMMUNITY): Payer: Self-pay | Admitting: Urology

## 2013-04-23 DIAGNOSIS — N289 Disorder of kidney and ureter, unspecified: Secondary | ICD-10-CM

## 2013-05-22 ENCOUNTER — Other Ambulatory Visit: Payer: Self-pay

## 2013-06-18 ENCOUNTER — Other Ambulatory Visit (HOSPITAL_COMMUNITY): Payer: Managed Care, Other (non HMO)

## 2013-06-19 ENCOUNTER — Other Ambulatory Visit (HOSPITAL_COMMUNITY): Payer: Self-pay | Admitting: Urology

## 2013-06-19 DIAGNOSIS — D49519 Neoplasm of unspecified behavior of unspecified kidney: Secondary | ICD-10-CM

## 2013-07-04 ENCOUNTER — Ambulatory Visit (HOSPITAL_COMMUNITY): Payer: BC Managed Care – PPO

## 2013-07-22 ENCOUNTER — Telehealth: Payer: Self-pay | Admitting: Family Medicine

## 2013-07-22 NOTE — Telephone Encounter (Signed)
Patient said that he sees dr. Laurance Flatten for his physicals but i do not see where he has seen moore

## 2013-07-31 NOTE — Telephone Encounter (Signed)
PT WANTS AN APPT WITH DR. Laurance Flatten FOR PHYSICAL BUT I DONT SEE WHERE HE HAS SEEN DR. Laurance Flatten. I DISCUSSED WITH PT AND HE STILL WANTS YOU TO ASK DR. MOORE

## 2013-08-01 ENCOUNTER — Telehealth: Payer: Self-pay | Admitting: Family Medicine

## 2013-08-04 ENCOUNTER — Other Ambulatory Visit (HOSPITAL_COMMUNITY): Payer: Self-pay | Admitting: Urology

## 2013-08-04 ENCOUNTER — Ambulatory Visit (HOSPITAL_COMMUNITY)
Admission: RE | Admit: 2013-08-04 | Discharge: 2013-08-04 | Disposition: A | Discharge status: Home / Self Care | Payer: BC Managed Care – PPO | Source: Ambulatory Visit | Attending: Urology | Admitting: Urology

## 2013-08-04 DIAGNOSIS — Z9889 Other specified postprocedural states: Secondary | ICD-10-CM

## 2013-08-04 DIAGNOSIS — N281 Cyst of kidney, acquired: Secondary | ICD-10-CM | POA: Insufficient documentation

## 2013-08-04 DIAGNOSIS — Z87448 Personal history of other diseases of urinary system: Secondary | ICD-10-CM | POA: Insufficient documentation

## 2013-08-04 DIAGNOSIS — D49519 Neoplasm of unspecified behavior of unspecified kidney: Secondary | ICD-10-CM

## 2013-08-04 LAB — POCT I-STAT CREATININE: Creatinine, Ser: 1 mg/dL (ref 0.50–1.35)

## 2013-08-04 MED ORDER — GADOBENATE DIMEGLUMINE 529 MG/ML IV SOLN
15.0000 mL | Freq: Once | INTRAVENOUS | Status: AC | PRN
  Administered 2013-08-04: 15 mL via INTRAVENOUS

## 2013-08-05 NOTE — Telephone Encounter (Signed)
Dup note  

## 2013-08-06 NOTE — Telephone Encounter (Signed)
appt made

## 2013-09-11 ENCOUNTER — Ambulatory Visit: Payer: Managed Care, Other (non HMO) | Admitting: Family Medicine

## 2013-09-25 ENCOUNTER — Ambulatory Visit (INDEPENDENT_AMBULATORY_CARE_PROVIDER_SITE_OTHER): Payer: BC Managed Care – PPO | Admitting: Family Medicine

## 2013-09-25 ENCOUNTER — Encounter: Payer: Self-pay | Admitting: Family Medicine

## 2013-09-25 VITALS — BP 143/77 | HR 71 | Temp 98.6°F | Ht 74.0 in | Wt 213.0 lb

## 2013-09-25 DIAGNOSIS — I4891 Unspecified atrial fibrillation: Secondary | ICD-10-CM

## 2013-09-25 DIAGNOSIS — Z0000 Encounter for general adult medical examination without abnormal findings: Secondary | ICD-10-CM

## 2013-09-25 DIAGNOSIS — N400 Enlarged prostate without lower urinary tract symptoms: Secondary | ICD-10-CM | POA: Insufficient documentation

## 2013-09-25 LAB — POCT UA - MICROSCOPIC ONLY
BACTERIA, U MICROSCOPIC: NEGATIVE
CASTS, UR, LPF, POC: NEGATIVE
CRYSTALS, UR, HPF, POC: NEGATIVE
MUCUS UA: NEGATIVE
RBC, URINE, MICROSCOPIC: NEGATIVE
WBC, Ur, HPF, POC: NEGATIVE
Yeast, UA: NEGATIVE

## 2013-09-25 LAB — POCT CBC
GRANULOCYTE PERCENT: 77.2 % (ref 37–80)
HEMATOCRIT: 40.6 % — AB (ref 43.5–53.7)
HEMOGLOBIN: 12.9 g/dL — AB (ref 14.1–18.1)
Lymph, poc: 1.3 (ref 0.6–3.4)
MCH, POC: 28.5 pg (ref 27–31.2)
MCHC: 31.7 g/dL — AB (ref 31.8–35.4)
MCV: 90 fL (ref 80–97)
MPV: 8.5 fL (ref 0–99.8)
POC GRANULOCYTE: 4.9 (ref 2–6.9)
POC LYMPH PERCENT: 20 %L (ref 10–50)
Platelet Count, POC: 223 10*3/uL (ref 142–424)
RBC: 4.5 M/uL — AB (ref 4.69–6.13)
RDW, POC: 12.9 %
WBC: 6.3 10*3/uL (ref 4.6–10.2)

## 2013-09-25 LAB — POCT URINALYSIS DIPSTICK
Bilirubin, UA: NEGATIVE
Glucose, UA: NEGATIVE
Ketones, UA: NEGATIVE
Leukocytes, UA: NEGATIVE
Nitrite, UA: NEGATIVE
PROTEIN UA: NEGATIVE
RBC UA: NEGATIVE
SPEC GRAV UA: 1.01
UROBILINOGEN UA: NEGATIVE
pH, UA: 7.5

## 2013-09-25 NOTE — Progress Notes (Signed)
Subjective:    Patient ID: Kevin Doyle, male    DOB: March 09, 1951, 63 y.o.   MRN: [id number]  HPI Pt here for follow up and management of chronic medical problems. The patient comes in today for an annual exam. He is to use a rectal and PSA and lab work. He will check with his insurance regarding the Prevnar vaccine. He will be given an FOBT to return. He is review of systems was essentially negative. His last colonoscopy was done in November 2010.          Patient Active Problem List   Diagnosis Date Noted  . BPH (benign prostatic hyperplasia) 09/25/2013  . Family history of arrhythmia 02/10/2013  . Orthostatic hypotension 02/10/2013  . Dizzy 02/10/2013  . Atrial fibrillation 02/10/2013  . Normocytic anemia 07/10/2012  . Elevated LFTs 07/10/2012  . S/P laparoscopic cholecystectomy Nove 2013 05/23/2012  . Renal mass, right 05/19/2012   Outpatient Encounter Prescriptions as of 09/25/2013  Medication Sig  . aspirin 81 MG tablet Take 81 mg by mouth daily.  . Multiple Vitamin (MULTI-VITAMIN DAILY PO) Take 1 tablet by mouth daily.  . [DISCONTINUED] diltiazem (CARDIZEM LA) 240 MG 24 hr tablet Take 1 tablet (240 mg total) by mouth daily.  . [DISCONTINUED] 0.9 %  sodium chloride infusion   . [DISCONTINUED] IV Catheter KIT     Review of Systems  Constitutional: Negative.   HENT: Negative.   Eyes: Negative.   Respiratory: Negative.   Cardiovascular: Negative.   Gastrointestinal: Negative.   Endocrine: Negative.   Genitourinary: Negative.   Musculoskeletal: Negative.   Skin: Negative.   Allergic/Immunologic: Negative.   Neurological: Negative.   Hematological: Negative.   Psychiatric/Behavioral: Negative.        Objective:   Physical Exam  Nursing note and vitals reviewed. Constitutional: He is oriented to person, place, and time. He appears well-developed and well-nourished. No distress.  HENT:  Head: Normocephalic and atraumatic.  Right Ear: External ear normal.  Left  Ear: External ear normal.  Nose: Nose normal.  Mouth/Throat: Oropharynx is clear and moist. No oropharyngeal exudate.  Eyes: Conjunctivae and EOM are normal. Pupils are equal, round, and reactive to light. Right eye exhibits no discharge. Left eye exhibits no discharge. No scleral icterus.  Neck: Normal range of motion. Neck supple. No thyromegaly present.  Cardiovascular: Normal rate, regular rhythm, normal heart sounds and intact distal pulses.  Exam reveals no gallop and no friction rub.   No murmur heard. At 72 per minute. No atrial fibrillation auscultated  Pulmonary/Chest: Effort normal and breath sounds normal. No respiratory distress. He has no wheezes. He has no rales. He exhibits no tenderness.  Abdominal: Soft. Bowel sounds are normal. He exhibits no mass. There is no tenderness. There is no rebound and no guarding.  Genitourinary: Rectum normal and penis normal.  The prostate was enlarged but soft and smooth. There were no rectal masses. There were no inguinal hernias palpated. The external genitalia were normal.  Musculoskeletal: Normal range of motion. He exhibits no edema and no tenderness.  Lymphadenopathy:    He has no cervical adenopathy.  Neurological: He is alert and oriented to person, place, and time. He has normal reflexes. No cranial nerve deficit.  Skin: Skin is warm and dry. No rash noted. No erythema. No pallor.  Psychiatric: He has a normal mood and affect. His behavior is normal. Judgment and thought content normal.   BP 143/77  Pulse 71  Temp(Src) 98.6 F (37 C) (Oral)  Ht '6\' 2"'  (1.88 m)  Wt 213 lb (96.616 kg)  BMI 27.34 kg/m2  [facility] EKG, normal sinus rhythm       Assessment & Plan:  1. Atrial fibrillation - POCT CBC - BMP8+EGFR - Hepatic function panel - EKG 12-Lead  2. BPH (benign prostatic hyperplasia) - POCT UA - Microscopic Only - POCT urinalysis dipstick - PSA, total and free  3. Annual physical exam - POCT CBC - BMP8+EGFR -  Hepatic function panel - NMR, lipoprofile - Vit D  25 hydroxy (rtn osteoporosis monitoring) - POCT UA - Microscopic Only - POCT urinalysis dipstick - EKG 12-Lead  4. Gilbert's syndrome  Patient Instructions  Continue current medications. Continue good therapeutic lifestyle changes which include good diet and exercise. Fall precautions discussed with patient. If an FOBT was given today- please return it to our front desk. If you are over 27 years old - you may need Prevnar 32 or the adult Pneumonia vaccine.  Return FOBT We will call you with the results of the lab work once those results are unavailable We will also arrange for you to have an appointment with the cardiologist because of a history of atrial fibrillation Because of the past history of smoking we will also consider getting a screening chest CT at some point in the future   Arrie Senate MD

## 2013-09-25 NOTE — Addendum Note (Signed)
Addended by: Zannie Cove on: 09/25/2013 12:43 PM   Modules accepted: Orders

## 2013-09-25 NOTE — Patient Instructions (Addendum)
Continue current medications. Continue good therapeutic lifestyle changes which include good diet and exercise. Fall precautions discussed with patient. If an FOBT was given today- please return it to our front desk. If you are over 63 years old - you may need Prevnar 8 or the adult Pneumonia vaccine.  Return FOBT We will call you with the results of the lab work once those results are unavailable We will also arrange for you to have an appointment with the cardiologist because of a history of atrial fibrillation Because of the past history of smoking we will also consider getting a screening chest CT at some point in the future

## 2013-09-26 LAB — BMP8+EGFR
BUN/Creatinine Ratio: 16 (ref 10–22)
BUN: 16 mg/dL (ref 8–27)
CALCIUM: 9 mg/dL (ref 8.6–10.2)
CHLORIDE: 101 mmol/L (ref 97–108)
CO2: 25 mmol/L (ref 18–29)
Creatinine, Ser: 1.03 mg/dL (ref 0.76–1.27)
GFR calc Af Amer: 90 mL/min/{1.73_m2} (ref >59)
GFR calc non Af Amer: 77 mL/min/{1.73_m2} (ref >59)
GLUCOSE: 96 mg/dL (ref 65–99)
POTASSIUM: 4 mmol/L (ref 3.5–5.2)
Sodium: 139 mmol/L (ref 134–144)

## 2013-09-26 LAB — PSA, TOTAL AND FREE
PSA, Free Pct: 40 %
PSA, Free: 0.32 ng/mL
PSA: 0.8 ng/mL (ref 0.0–4.0)

## 2013-09-26 LAB — HEPATIC FUNCTION PANEL
ALT: 18 IU/L (ref 0–44)
AST: 17 IU/L (ref 0–40)
Albumin: 4.3 g/dL (ref 3.6–4.8)
Alkaline Phosphatase: 104 IU/L (ref 39–117)
Bilirubin, Direct: 0.34 mg/dL (ref 0.00–0.40)
TOTAL PROTEIN: 6.6 g/dL (ref 6.0–8.5)
Total Bilirubin: 1.5 mg/dL — ABNORMAL HIGH (ref 0.0–1.2)

## 2013-09-26 LAB — NMR, LIPOPROFILE
CHOLESTEROL: 140 mg/dL (ref <200)
HDL CHOLESTEROL BY NMR: 41 mg/dL (ref >=40)
HDL PARTICLE NUMBER: 23.5 umol/L — AB (ref >=30.5)
LDL PARTICLE NUMBER: 1033 nmol/L — AB (ref <1000)
LDL Size: 20.5 nm — ABNORMAL LOW (ref >20.5)
LDLC SERPL CALC-MCNC: 78 mg/dL (ref <100)
LP-IR Score: 49 — ABNORMAL HIGH (ref <=45)
Small LDL Particle Number: 475 nmol/L (ref <=527)
TRIGLYCERIDES BY NMR: 103 mg/dL (ref <150)

## 2013-09-26 LAB — VITAMIN D 25 HYDROXY (VIT D DEFICIENCY, FRACTURES): Vit D, 25-Hydroxy: 31.2 ng/mL (ref 30.0–100.0)

## 2013-10-04 IMAGING — CR DG CHEST 2V
2 series · 2 of 2 positions shown · non-contrast
Comparison: Two-view chest x-ray 05/14/2012.

CLINICAL DATA: New onset atrial fibrillation.

CHEST - 2 VIEW

[w chest pa]
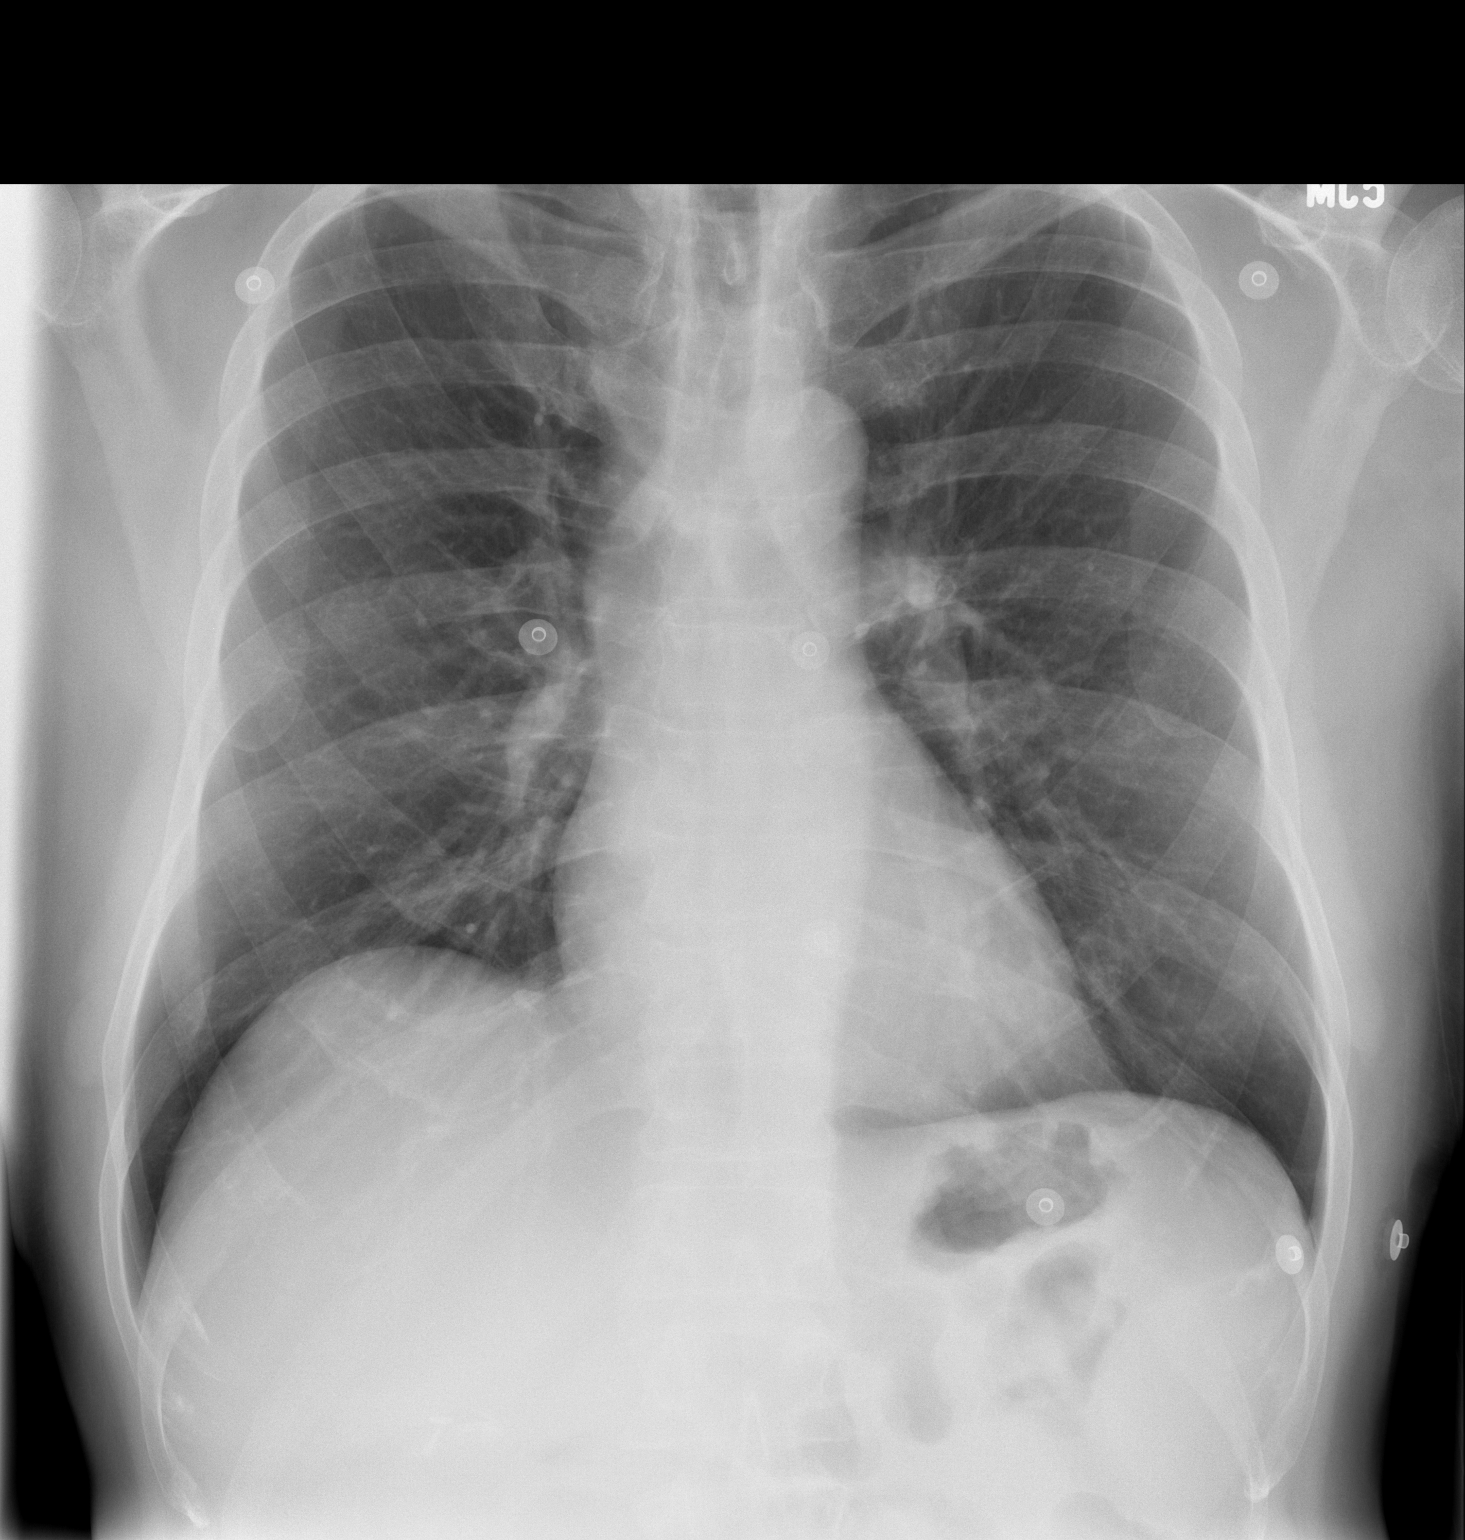

[w chest lat]
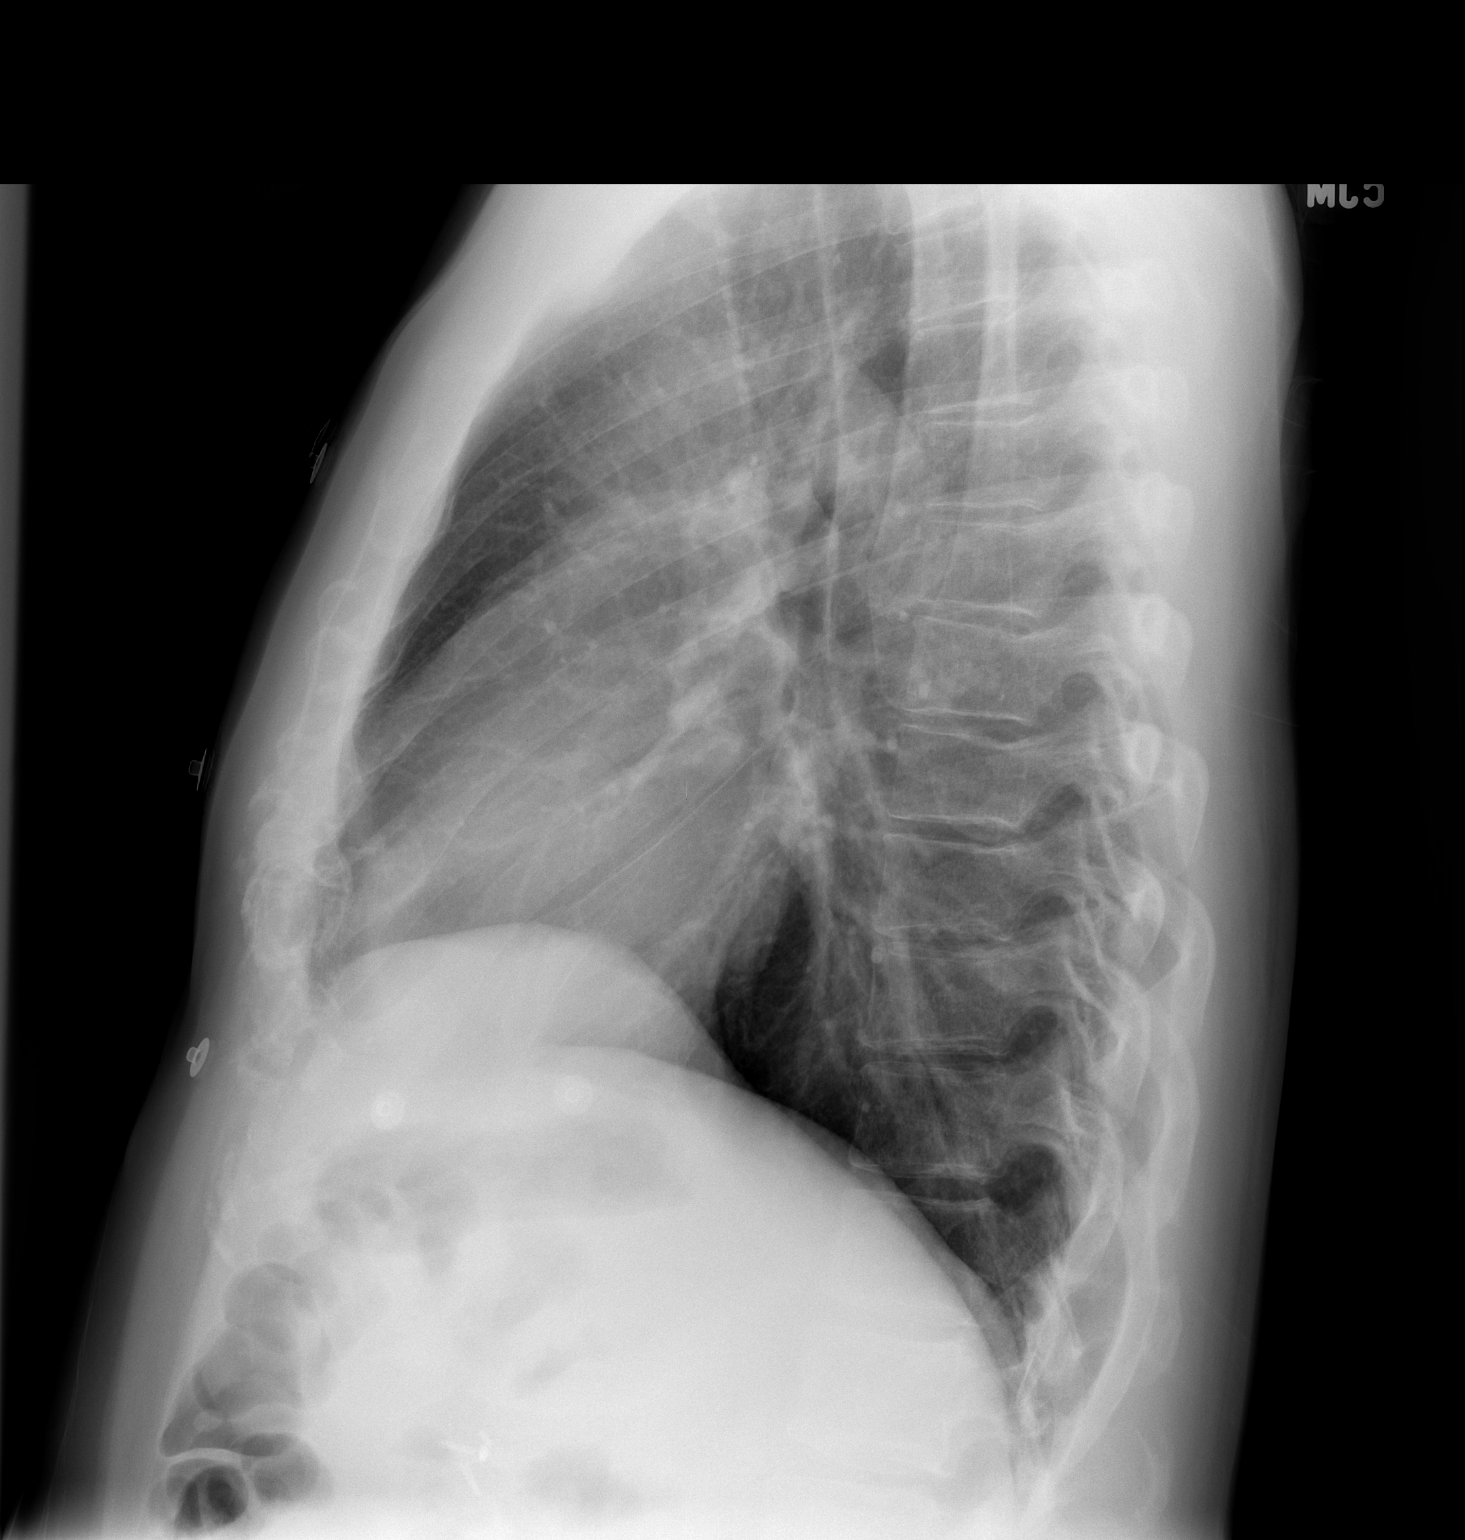

[2 of 2 positions shown; findings below may reference images not displayed]

FINDINGS: Cardiomediastinal silhouette unremarkable.  Lungs clear.
Bronchovascular markings normal.  Pulmonary vascularity normal.  No
pleural effusions.  No pneumothorax.  Visualized bony thorax
intact.  No significant interval change.
IMPRESSION: Normal and stable examination.

## 2013-11-18 ENCOUNTER — Ambulatory Visit: Payer: BC Managed Care – PPO | Admitting: Cardiology

## 2013-12-05 ENCOUNTER — Ambulatory Visit (INDEPENDENT_AMBULATORY_CARE_PROVIDER_SITE_OTHER): Payer: BC Managed Care – PPO | Admitting: Family Medicine

## 2013-12-05 ENCOUNTER — Encounter: Payer: Self-pay | Admitting: Family Medicine

## 2013-12-05 ENCOUNTER — Ambulatory Visit (INDEPENDENT_AMBULATORY_CARE_PROVIDER_SITE_OTHER): Payer: BC Managed Care – PPO | Admitting: Cardiology

## 2013-12-05 ENCOUNTER — Encounter: Payer: Self-pay | Admitting: Cardiology

## 2013-12-05 VITALS — BP 140/84 | HR 80 | Ht 74.0 in | Wt 212.0 lb

## 2013-12-05 VITALS — BP 156/82 | HR 85 | Temp 99.0°F | Ht 74.0 in | Wt 213.0 lb

## 2013-12-05 DIAGNOSIS — I951 Orthostatic hypotension: Secondary | ICD-10-CM

## 2013-12-05 DIAGNOSIS — W57XXXA Bitten or stung by nonvenomous insect and other nonvenomous arthropods, initial encounter: Secondary | ICD-10-CM

## 2013-12-05 DIAGNOSIS — I4891 Unspecified atrial fibrillation: Secondary | ICD-10-CM

## 2013-12-05 DIAGNOSIS — T148 Other injury of unspecified body region: Secondary | ICD-10-CM

## 2013-12-05 MED ORDER — DOXYCYCLINE HYCLATE 100 MG PO TABS: 100.0000 mg | ORAL_TABLET | Freq: Two times a day (BID) | ORAL | Status: DC

## 2013-12-05 NOTE — Patient Instructions (Addendum)
The current medical regimen is effective;  continue present plan and medications.  Follow up as needed 

## 2013-12-05 NOTE — Patient Instructions (Signed)
Take antibiotic as prescribed. Take precaution in the sun. Call back in 4 days to report improvement and sooner if worsening symptoms develop.

## 2013-12-05 NOTE — Progress Notes (Signed)
HPI The patient presents for evaluation of atrial fibrillation. He thinks he had his 20 years ago with a bout of vertigo. He had another episode last July again during a bout of vertigo. I reviewed his EKG and emergency room visit. He was rate controlled and went home and then subsequently went back into sinus rhythm. He says that since then he has not had any other episodes. He said he has never had an episode unassociated with some other symptoms such as an acute episode of vertigo. He thinks the vertigo came first followed by the arrhythmia. He does not otherwise have any palpitations. No presyncope or syncope. He has no chest pressure, neck or arm discomfort. He is very active. He cannot bring on any symptoms. He has not had any prior cardiac workup for symptoms.  No Known Allergies  Current Outpatient Prescriptions  Medication Sig Dispense Refill  . aspirin 81 MG tablet Take 81 mg by mouth daily.      . Multiple Vitamin (MULTI-VITAMIN DAILY PO) Take 1 tablet by mouth daily.       No current facility-administered medications for this visit.    Past Medical History  Diagnosis Date  . GERD (gastroesophageal reflux disease)   . Gilbert's disease   . Atrial fibrillation     Past Surgical History  Procedure Laterality Date  . Hernia repairs      x 3   . Colonoscopy    . Upper gastrointestinal endoscopy    . Inguinal hernia repair  1986  . Cholecystectomy  05/18/2012    Procedure: LAPAROSCOPIC CHOLECYSTECTOMY WITH INTRAOPERATIVE CHOLANGIOGRAM;  Surgeon: Pedro Earls, MD;  Location: [facility];  Service: General;  Laterality: N/A;  . Robotic assited partial nephrectomy  06/24/2012    Procedure: ROBOTIC ASSITED PARTIAL NEPHRECTOMY;  Surgeon: Dutch Gray, MD;  Location: [facility];  Service: Urology;  Laterality: Right;  . Laparoscopic lysis of adhesions  06/24/2012    Procedure: LAPAROSCOPIC LYSIS OF ADHESIONS;  Surgeon: Dutch Gray, MD;  Location: [facility];  Service: Urology;;  . Ercp   07/11/2012    Procedure: ENDOSCOPIC RETROGRADE CHOLANGIOPANCREATOGRAPHY (ERCP);  Surgeon: Inda Castle, MD;  Location: [facility];  Service: Endoscopy;  Laterality: N/A;    Family History  Problem Relation Age of Onset  . Colon cancer Neg Hx   . Lung cancer Mother   . Brain cancer Father     History   Social History  . Marital Status: Married    Spouse Name: N/A    Number of Children: N/A  . Years of Education: N/A   Occupational History  . Not on file.   Social History Main Topics  . Smoking status: Former Research scientist (life sciences)  . Smokeless tobacco: Former Systems developer    Quit date: 06/19/1986     Comment: Quit at age 62   . Alcohol Use: Yes     Comment: Rare-Beer   . Drug Use: No  . Sexual Activity: Not on file   Other Topics Concern  . Not on file   Social History Narrative   Married.  Independent of ADLs.  2 cups of coffee daily     ROS:  As stated in the HPI and negative for all other systems.  PHYSICAL EXAM BP 140/84  Pulse 80  Ht 6\' 2"  (1.88 m)  Wt 212 lb (96.163 kg)  BMI 27.21 kg/m2 GENERAL:  Well appearing HEENT:  Pupils equal round and reactive, fundi not visualized, oral mucosa unremarkable NECK:  No jugular  venous distention, waveform within normal limits, carotid upstroke brisk and symmetric, no bruits, no thyromegaly LYMPHATICS:  No cervical, inguinal adenopathy LUNGS:  Clear to auscultation bilaterally BACK:  No CVA tenderness CHEST:  Unremarkable HEART:  PMI not displaced or sustained,S1 and S2 within normal limits, no S3, no S4, no clicks, no rubs, no murmurs ABD:  Flat, positive bowel sounds normal in frequency in pitch, no bruits, no rebound, no guarding, no midline pulsatile mass, no hepatomegaly, no splenomegaly EXT:  2 plus pulses throughout, no edema, no cyanosis no clubbing SKIN:  No rashes no nodules, erythema from a bug bite on the right upper thigh. NEURO:  Cranial nerves II through XII grossly intact, motor grossly intact throughout PSYCH:   Cognitively intact, oriented to person place and time  EKG:  Sinus rhythm, rate 67, axis within normal limits, intervals within normal limits, no acute ST-T wave changes. 09/25/13.  ASSESSMENT AND PLAN  ATRIAL FIB:  This was lone atrial fib related probably to an acute illness (vertigo).  No further work up or change in therapy is indicated unless he has recurent symptoms.  BUG BITE:  I will refer him back to Dr. Laurance Flatten.

## 2013-12-05 NOTE — Progress Notes (Signed)
   Subjective:    Patient ID: Kevin Doyle, male    DOB: [birth year], 63 y.o.   MRN: [id number]  HPI Patient comes in today with a rash to left anterior. The area originated as several discrete insect bites 2 weeks ago. Now he complains of burning, warmth and confluent redness. He has applied an antibiotic ointment with no improvement. The patient was seen earlier today by the cardiologist and he send [place] a note feeling that this rash should be seen and treated .   Review of Systems  Skin:       Rash-anterior left thigh  All other systems reviewed and are negative.      Objective:   Physical Exam  Nursing note and vitals reviewed. Constitutional: He is oriented to person, place, and time. He appears well-developed and well-nourished. No distress.  HENT:  Head: Normocephalic.  Musculoskeletal: Normal range of motion.  Neurological: He is alert and oriented to person, place, and time.  Skin: Skin is warm and dry. Rash noted. There is erythema.  There is a circular rash on the left anterior thigh which has warm and rubor and erythema. It is somewhat raised in the center and more pale in the center. The patient indicates it started with 3 or 4 different chigger type bites and that the area has gradually enlarged to about 3 inches in diameter. There is no drainage. There is no fluctuance.  Psychiatric: He has a normal mood and affect. His behavior is normal. Judgment and thought content normal.   BP 156/82  Pulse 85  Temp(Src) 99 F (37.2 C) (Oral)  Ht 6\' 2"  (1.88 m)  Wt 213 lb (96.616 kg)  BMI 27.34 kg/m2        Assessment & Plan:  1. Insect bite Meds ordered this encounter  Medications  . doxycycline (VIBRA-TABS) 100 MG tablet    Sig: Take 1 tablet (100 mg total) by mouth 2 (two) times daily.    Dispense:  28 tablet    Refill:  0   Patient Instructions  Take antibiotic as prescribed. Take precaution in the sun. Call back in 4 days to report improvement and sooner if  worsening symptoms develop.     Doxycycline 100mg  BID x 2 weeks with food. Report any worsening symptoms and call in 4 days to report improvement.  Watch caffeine intake and avoid sun.  Arrie Senate MD

## 2013-12-12 ENCOUNTER — Telehealth: Payer: Self-pay | Admitting: Family Medicine

## 2013-12-12 NOTE — Telephone Encounter (Signed)
The patient was called and he was instructed to take one doxycycline daily for 3 more days and then to discontinue it especially since the rash is almost healed. He does not have to be called back because I called him

## 2013-12-12 NOTE — Telephone Encounter (Signed)
Patient is getting stomach pain from the doxy he wants to know if he can stop it the infection is cleared up?

## 2014-09-02 ENCOUNTER — Encounter: Payer: Self-pay | Admitting: Family Medicine

## 2014-09-02 ENCOUNTER — Ambulatory Visit (INDEPENDENT_AMBULATORY_CARE_PROVIDER_SITE_OTHER): Payer: BLUE CROSS/BLUE SHIELD | Admitting: Family Medicine

## 2014-09-02 VITALS — BP 141/85 | HR 77 | Temp 98.1°F | Ht 74.0 in | Wt 208.0 lb

## 2014-09-02 DIAGNOSIS — Z8679 Personal history of other diseases of the circulatory system: Secondary | ICD-10-CM

## 2014-09-02 DIAGNOSIS — Z0000 Encounter for general adult medical examination without abnormal findings: Secondary | ICD-10-CM

## 2014-09-02 DIAGNOSIS — N400 Enlarged prostate without lower urinary tract symptoms: Secondary | ICD-10-CM

## 2014-09-02 DIAGNOSIS — R7989 Other specified abnormal findings of blood chemistry: Secondary | ICD-10-CM

## 2014-09-02 DIAGNOSIS — Z23 Encounter for immunization: Secondary | ICD-10-CM

## 2014-09-02 NOTE — Progress Notes (Signed)
Subjective:    Patient ID: Kevin Doyle, male    DOB: [birth year], 64 y.o.   MRN: [id number]  HPI  64 year old male comes in today for his annual exam. He has no complaints. 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 He is due to receive an FOBT and he will be given this today. He will also get his flu shot today. He will check with his insurance regarding the shingles shot and the Prevnar vaccine. The patient is also past due on his eye exam. He saw a cardiologist last year because of his history of atrial fibrillation and his mom's history of atrial fibrillation and he gave him an okay exam with no further treatment necessary. He has no specific complaints other than he does have occasional heartburn and he usually takes some over-the-counter medicine for this. He denies chest pain shortness of breath and trouble swallowing in the stool or trouble voiding. He has no erectile difficulty.  Patient Active Problem List   Diagnosis Date Noted  . BPH (benign prostatic hyperplasia) 09/25/2013  . Gilbert's syndrome 09/25/2013  . Family history of arrhythmia 02/10/2013  . Orthostatic hypotension 02/10/2013  . Dizzy 02/10/2013  . Atrial fibrillation 02/10/2013  . Normocytic anemia 07/10/2012  . Elevated LFTs 07/10/2012  . S/P laparoscopic cholecystectomy Nove 2013 05/23/2012  . Renal mass, right 05/19/2012   Outpatient Encounter Prescriptions as of 09/02/2014  Medication Sig  . aspirin 81 MG tablet Take 81 mg by mouth daily.  . Multiple Vitamin (MULTI-VITAMIN DAILY PO) Take 1 tablet by mouth daily.  . [DISCONTINUED] doxycycline (VIBRA-TABS) 100 MG tablet Take 1 tablet (100 mg total) by mouth 2 (two) times daily.     Review of Systems  Constitutional: Negative.   HENT: Negative.   Eyes: Negative.   Respiratory: Negative.   Cardiovascular: Negative.   Gastrointestinal: Negative.   Endocrine: Negative.   Genitourinary:  Negative.   Musculoskeletal: Negative.   Allergic/Immunologic: Negative.   Neurological: Negative.   Hematological: Negative.   Psychiatric/Behavioral: Negative.        Objective:   Physical Exam  Constitutional: He is oriented to person, place, and time. He appears well-developed and well-nourished. No distress.  Alert and cooperative  HENT:  Head: Normocephalic and atraumatic.  Right Ear: External ear normal.  Left Ear: External ear normal.  Mouth/Throat: Oropharynx is clear and moist. No oropharyngeal exudate.  Nasal passages are narrow with left being worse than the right with congestion bilaterally.  Eyes: Conjunctivae and EOM are normal. Pupils are equal, round, and reactive to light. Right eye exhibits no discharge. Left eye exhibits no discharge. No scleral icterus.  Neck: Normal range of motion. Neck supple. No thyromegaly present.  No carotid bruits or anterior cervical adenopathy  Cardiovascular: Normal rate, regular rhythm, normal heart sounds and intact distal pulses.   No murmur heard. The heart has a regular rate and rhythm at 72/m  Pulmonary/Chest: Effort normal and breath sounds normal. No respiratory distress. He has no wheezes. He has no rales. He exhibits no tenderness.  Lungs are clear anteriorly and posteriorly  Abdominal: Soft. Bowel sounds are normal. He exhibits no mass. There is no tenderness. There is no rebound and no guarding.  He has several scars on his abdomen for gallbladder removal and right inguinal hernia repair. There is no abdominal tenderness or bruits or inguinal adenopathy  Genitourinary: Rectum normal and  penis normal.  The prostate was slightly enlarged but smooth. There were no rectal masses. The external genitalia were normal. There were no inguinal hernias palpated. There were no inguinal nodes.  Musculoskeletal: Normal range of motion. He exhibits no edema or tenderness.  Lymphadenopathy:    He has no cervical adenopathy.  Neurological:  He is alert and oriented to person, place, and time. He has normal reflexes. No cranial nerve deficit.  Skin: Skin is warm and dry. No rash noted. No erythema. No pallor.  Psychiatric: He has a normal mood and affect. His behavior is normal. Judgment and thought content normal.  Nursing note and vitals reviewed.    BP 141/85 mmHg  Pulse 77  Temp(Src) 98.1 F (36.7 C) (Oral)  Ht _0  (1.88 m)  Wt 208 lb (94.348 kg)  BMI 26.69 kg/m2      Assessment & Plan:  1. Annual physical exam -The patient will be called with the lab work results once those results become available -He should return his FOBT -He should check with his insurance regarding the shingles shot and Prevnar vaccine -He is also due to get his eyes examined and he should do this. - BMP8+EGFR - NMR, lipoprofile - Vit D  25 hydroxy (rtn osteoporosis monitoring) - PSA, total and free - Thyroid Panel With TSH - CBC with Differential/Platelet - Hepatic function panel -Urinalysis  2. Encounter for immunization -He will receive the flu shot today and will check with insurance regarding the Prevnar vaccine and shingles vaccine  3. BPH -Yearly rectal exams and PSA  4. History of atrial fibrillation -Watch caffeine intake and return to office if this continues to occur  5. Rhinitis -Use saline nose spray  Patient Instructions  Check insurance coverage of Zostavax (shingles vaccine), also check about the Prevnar vaccine Return FOBT Stay as active as possible and drink plenty of fluids This winter use a cool mist humidifier and nasal saline for head congestion and dryness Keep the house as cool as possible Always be careful and don't put yourself at risk for falling Avoid caffeine as much as possible Do not forget to get your eye exam         Influenza Virus Vaccine injection What is this medicine? INFLUENZA VIRUS VACCINE (in floo EN zuh VAHY ruhs vak SEEN) helps to reduce the risk of getting influenza  also known as the flu. The vaccine only helps protect you against some strains of the flu. This medicine may be used for other purposes; ask your health care provider or pharmacist if you have questions. COMMON BRAND NAME(S): Afluria, Agriflu, Fluarix, Fluarix Quadrivalent, FLUCELVAX, Flulaval, Fluvirin, Fluzone, Fluzone High-Dose, Fluzone Intradermal What should I tell my health care provider before I take this medicine? They need to know if you have any of these conditions: -bleeding disorder like hemophilia -fever or infection -Guillain-Barre syndrome or other neurological problems -immune system problems -infection with the human immunodeficiency virus (HIV) or AIDS -low blood platelet counts -multiple sclerosis -an unusual or allergic reaction to influenza virus vaccine, latex, other medicines, foods, dyes, or preservatives. Different brands of vaccines contain different allergens. Some may contain latex or eggs. Talk to your doctor about your allergies to make sure that you get the right vaccine. -pregnant or trying to get pregnant -breast-feeding How should I use this medicine? This vaccine is for injection into a muscle or under the skin. It is given by a health care professional. A copy of Vaccine Information Statements will be given  before each vaccination. Read this sheet carefully each time. The sheet may change frequently. Talk to your healthcare provider to see which vaccines are right for you. Some vaccines should not be used in all age groups. Overdosage: If you think you have taken too much of this medicine contact a poison control center or emergency room at once. NOTE: This medicine is only for you. Do not share this medicine with others. What if I miss a dose? This does not apply. What may interact with this medicine? -chemotherapy or radiation therapy -medicines that lower your immune system like etanercept, anakinra, infliximab, and adalimumab -medicines that treat or  prevent blood clots like warfarin -phenytoin -steroid medicines like prednisone or cortisone -theophylline -vaccines This list may not describe all possible interactions. Give your health care provider a list of all the medicines, herbs, non-prescription drugs, or dietary supplements you use. Also tell them if you smoke, drink alcohol, or use illegal drugs. Some items may interact with your medicine. What should I watch for while using this medicine? Report any side effects that do not go away within 3 days to your doctor or health care professional. Call your health care provider if any unusual symptoms occur within 6 weeks of receiving this vaccine. You may still catch the flu, but the illness is not usually as bad. You cannot get the flu from the vaccine. The vaccine will not protect against colds or other illnesses that may cause fever. The vaccine is needed every year. What side effects may I notice from receiving this medicine? Side effects that you should report to your doctor or health care professional as soon as possible: -allergic reactions like skin rash, itching or hives, swelling of the face, lips, or tongue Side effects that usually do not require medical attention (report to your doctor or health care professional if they continue or are bothersome): -fever -headache -muscle aches and pains -pain, tenderness, redness, or swelling at the injection site -tiredness This list may not describe all possible side effects. Call your doctor for medical advice about side effects. You may report side effects to FDA at 1-800-FDA-1088. Where should I keep my medicine? The vaccine will be given by a health care professional in a clinic, pharmacy, doctor's office, or other health care setting. You will not be given vaccine doses to store at home. NOTE: This sheet is a summary. It may not cover all possible information. If you have questions about this medicine, talk to your doctor, pharmacist,  or health care provider.  2015, [employer] Standard. (2012-01-11 59:97:74)    Arrie Senate MD

## 2014-09-02 NOTE — Patient Instructions (Addendum)
Check insurance coverage of Zostavax (shingles vaccine), also check about the Prevnar vaccine Return FOBT Stay as active as possible and drink plenty of fluids This winter use a cool mist humidifier and nasal saline for head congestion and dryness Keep the house as cool as possible Always be careful and don't put yourself at risk for falling Avoid caffeine as much as possible Do not forget to get your eye exam         Influenza Virus Vaccine injection What is this medicine? INFLUENZA VIRUS VACCINE (in floo EN zuh VAHY ruhs vak SEEN) helps to reduce the risk of getting influenza also known as the flu. The vaccine only helps protect you against some strains of the flu. This medicine may be used for other purposes; ask your health care provider or pharmacist if you have questions. COMMON BRAND NAME(S): Afluria, Agriflu, Fluarix, Fluarix Quadrivalent, FLUCELVAX, Flulaval, Fluvirin, Fluzone, Fluzone High-Dose, Fluzone Intradermal What should I tell my health care provider before I take this medicine? They need to know if you have any of these conditions: -bleeding disorder like hemophilia -fever or infection -Guillain-Barre syndrome or other neurological problems -immune system problems -infection with the human immunodeficiency virus (HIV) or AIDS -low blood platelet counts -multiple sclerosis -an unusual or allergic reaction to influenza virus vaccine, latex, other medicines, foods, dyes, or preservatives. Different brands of vaccines contain different allergens. Some may contain latex or eggs. Talk to your doctor about your allergies to make sure that you get the right vaccine. -pregnant or trying to get pregnant -breast-feeding How should I use this medicine? This vaccine is for injection into a muscle or under the skin. It is given by a health care professional. A copy of Vaccine Information Statements will be given before each vaccination. Read this sheet carefully each time. The  sheet may change frequently. Talk to your healthcare provider to see which vaccines are right for you. Some vaccines should not be used in all age groups. Overdosage: If you think you have taken too much of this medicine contact a poison control center or emergency room at once. NOTE: This medicine is only for you. Do not share this medicine with others. What if I miss a dose? This does not apply. What may interact with this medicine? -chemotherapy or radiation therapy -medicines that lower your immune system like etanercept, anakinra, infliximab, and adalimumab -medicines that treat or prevent blood clots like warfarin -phenytoin -steroid medicines like prednisone or cortisone -theophylline -vaccines This list may not describe all possible interactions. Give your health care provider a list of all the medicines, herbs, non-prescription drugs, or dietary supplements you use. Also tell them if you smoke, drink alcohol, or use illegal drugs. Some items may interact with your medicine. What should I watch for while using this medicine? Report any side effects that do not go away within 3 days to your doctor or health care professional. Call your health care provider if any unusual symptoms occur within 6 weeks of receiving this vaccine. You may still catch the flu, but the illness is not usually as bad. You cannot get the flu from the vaccine. The vaccine will not protect against colds or other illnesses that may cause fever. The vaccine is needed every year. What side effects may I notice from receiving this medicine? Side effects that you should report to your doctor or health care professional as soon as possible: -allergic reactions like skin rash, itching or hives, swelling of the face, lips, or tongue  Side effects that usually do not require medical attention (report to your doctor or health care professional if they continue or are bothersome): -fever -headache -muscle aches and  pains -pain, tenderness, redness, or swelling at the injection site -tiredness This list may not describe all possible side effects. Call your doctor for medical advice about side effects. You may report side effects to FDA at 1-800-FDA-1088. Where should I keep my medicine? The vaccine will be given by a health care professional in a clinic, pharmacy, doctor's office, or other health care setting. You will not be given vaccine doses to store at home. NOTE: This sheet is a summary. It may not cover all possible information. If you have questions about this medicine, talk to your doctor, pharmacist, or health care provider.  2015, Elsevier/Gold Standard. (2012-01-11 43:27:61)

## 2014-09-03 LAB — HEPATIC FUNCTION PANEL
ALT: 11 IU/L (ref 0–44)
AST: 16 IU/L (ref 0–40)
Albumin: 4.1 g/dL (ref 3.6–4.8)
Alkaline Phosphatase: 113 IU/L (ref 39–117)
BILIRUBIN, DIRECT: 0.36 mg/dL (ref 0.00–0.40)
Bilirubin Total: 1.9 mg/dL — ABNORMAL HIGH (ref 0.0–1.2)
TOTAL PROTEIN: 6.5 g/dL (ref 6.0–8.5)

## 2014-09-03 LAB — BMP8+EGFR
BUN / CREAT RATIO: 19 (ref 10–22)
BUN: 17 mg/dL (ref 8–27)
CO2: 24 mmol/L (ref 18–29)
Calcium: 9.1 mg/dL (ref 8.6–10.2)
Chloride: 102 mmol/L (ref 97–108)
Creatinine, Ser: 0.89 mg/dL (ref 0.76–1.27)
GFR, EST AFRICAN AMERICAN: 105 mL/min/{1.73_m2} (ref >59)
GFR, EST NON AFRICAN AMERICAN: 91 mL/min/{1.73_m2} (ref >59)
GLUCOSE: 92 mg/dL (ref 65–99)
Potassium: 4.8 mmol/L (ref 3.5–5.2)
Sodium: 141 mmol/L (ref 134–144)

## 2014-09-03 LAB — NMR, LIPOPROFILE
Cholesterol: 139 mg/dL (ref 100–199)
HDL Cholesterol by NMR: 44 mg/dL (ref >39)
HDL Particle Number: 29.2 umol/L — ABNORMAL LOW (ref >=30.5)
LDL Particle Number: 936 nmol/L (ref <1000)
LDL Size: 21.2 nm (ref >20.5)
LDL-C: 79 mg/dL (ref 0–99)
LP-IR SCORE: 37 (ref <=45)
Small LDL Particle Number: 125 nmol/L (ref <=527)
Triglycerides by NMR: 79 mg/dL (ref 0–149)

## 2014-09-03 LAB — CBC WITH DIFFERENTIAL/PLATELET
Basophils Absolute: 0.1 10*3/uL (ref 0.0–0.2)
Basos: 1 %
EOS: 2 %
Eosinophils Absolute: 0.2 10*3/uL (ref 0.0–0.4)
HCT: 40.3 % (ref 37.5–51.0)
Hemoglobin: 13.8 g/dL (ref 12.6–17.7)
IMMATURE GRANS (ABS): 0 10*3/uL (ref 0.0–0.1)
Immature Granulocytes: 0 %
LYMPHS: 20 %
Lymphocytes Absolute: 1.4 10*3/uL (ref 0.7–3.1)
MCH: 29.9 pg (ref 26.6–33.0)
MCHC: 34.2 g/dL (ref 31.5–35.7)
MCV: 87 fL (ref 79–97)
MONOS ABS: 0.5 10*3/uL (ref 0.1–0.9)
Monocytes: 8 %
NEUTROS PCT: 69 %
Neutrophils Absolute: 4.8 10*3/uL (ref 1.4–7.0)
Platelets: 256 10*3/uL (ref 150–379)
RBC: 4.61 x10E6/uL (ref 4.14–5.80)
RDW: 12.9 % (ref 12.3–15.4)
WBC: 6.9 10*3/uL (ref 3.4–10.8)

## 2014-09-03 LAB — PSA, TOTAL AND FREE
PSA, Free Pct: 40 %
PSA, Free: 0.32 ng/mL
PSA: 0.8 ng/mL (ref 0.0–4.0)

## 2014-09-03 LAB — THYROID PANEL WITH TSH
Free Thyroxine Index: 6.1 — ABNORMAL HIGH (ref 1.2–4.9)
T3 Uptake Ratio: 44 % — ABNORMAL HIGH (ref 24–39)
T4 TOTAL: 13.8 ug/dL — AB (ref 4.5–12.0)
TSH: 2.5 u[IU]/mL (ref 0.450–4.500)

## 2014-09-03 LAB — VITAMIN D 25 HYDROXY (VIT D DEFICIENCY, FRACTURES): VIT D 25 HYDROXY: 32.6 ng/mL (ref 30.0–100.0)

## 2014-09-04 ENCOUNTER — Telehealth: Payer: Self-pay | Admitting: *Deleted

## 2014-09-04 ENCOUNTER — Telehealth: Payer: Self-pay | Admitting: Family Medicine

## 2014-09-04 NOTE — Telephone Encounter (Signed)
Patient aware of results.

## 2014-09-04 NOTE — Telephone Encounter (Signed)
-----   Message from Chipper Herb, MD sent at 09/03/2014  2:03 PM EST ----- The blood sugar is good at 92. The creatinine, the most important kidney function test is within normal limits. The electrolytes including potassium are within normal limits. All cholesterol numbers with advanced lipid testing are excellent and at goal except the good cholesterol is running low. It is however higher than it was in the past. The patient should continue with aggressive therapeutic lifestyle changes which include diet and exercise The vitamin D level is at the low end of the normal range.. The patient should take vitamin D3 1000, over-the-counter, 1 daily. He should take this indefinitely. The brand that I prefer is sold at [facility] or the drugstore in [place] and it is the NOW brand The PSA remains low and within normal limits. The TSH is within normal limits. The T4, T3 and free thyroxine index are slightly elevated.--- Please repeat the thyroid profile in 4-6 weeks and the patient does not have to be fasting. The CBC has a normal white blood cell count. The hemoglobin is good at 13.8 and the platelet count is adequate. 1 liver function tests is slightly elevated and this is consistent with past readings. All other liver function tests are within normal limits

## 2014-09-04 NOTE — Addendum Note (Signed)
Addended by: Thana Ates on: 09/04/2014 10:36 AM   Modules accepted: Orders

## 2014-09-24 ENCOUNTER — Telehealth: Payer: Self-pay | Admitting: Family Medicine

## 2014-09-24 NOTE — Telephone Encounter (Signed)
Patient had question about a form. I told him to bring it in. No need to call him

## 2015-01-19 ENCOUNTER — Encounter: Payer: Self-pay | Admitting: Gastroenterology

## 2019-05-07 ENCOUNTER — Encounter: Payer: Self-pay | Admitting: Gastroenterology

## 2019-08-27 ENCOUNTER — Encounter: Payer: Self-pay | Admitting: Nurse Practitioner

## 2019-08-27 ENCOUNTER — Other Ambulatory Visit (INDEPENDENT_AMBULATORY_CARE_PROVIDER_SITE_OTHER): Payer: Medicare Other

## 2019-08-27 ENCOUNTER — Other Ambulatory Visit: Payer: Self-pay

## 2019-08-27 ENCOUNTER — Ambulatory Visit (INDEPENDENT_AMBULATORY_CARE_PROVIDER_SITE_OTHER): Payer: Medicare Other | Admitting: Nurse Practitioner

## 2019-08-27 VITALS — BP 118/70 | HR 84 | Temp 97.1°F | Ht 74.0 in | Wt 195.0 lb

## 2019-08-27 DIAGNOSIS — R197 Diarrhea, unspecified: Secondary | ICD-10-CM | POA: Diagnosis not present

## 2019-08-27 DIAGNOSIS — Z86010 Personal history of colonic polyps: Secondary | ICD-10-CM | POA: Diagnosis not present

## 2019-08-27 DIAGNOSIS — R112 Nausea with vomiting, unspecified: Secondary | ICD-10-CM

## 2019-08-27 DIAGNOSIS — R109 Unspecified abdominal pain: Secondary | ICD-10-CM | POA: Insufficient documentation

## 2019-08-27 DIAGNOSIS — R1010 Upper abdominal pain, unspecified: Secondary | ICD-10-CM

## 2019-08-27 LAB — CBC WITH DIFFERENTIAL/PLATELET
Basophils Absolute: 0.1 10*3/uL (ref 0.0–0.1)
Basophils Relative: 0.6 % (ref 0.0–3.0)
Eosinophils Absolute: 0.1 10*3/uL (ref 0.0–0.7)
Eosinophils Relative: 0.8 % (ref 0.0–5.0)
HCT: 39 % (ref 39.0–52.0)
Hemoglobin: 13.5 g/dL (ref 13.0–17.0)
Lymphocytes Relative: 21 % (ref 12.0–46.0)
Lymphs Abs: 1.6 10*3/uL (ref 0.7–4.0)
MCHC: 34.6 g/dL (ref 30.0–36.0)
MCV: 89.1 fl (ref 78.0–100.0)
Monocytes Absolute: 0.6 10*3/uL (ref 0.1–1.0)
Monocytes Relative: 7.1 % (ref 3.0–12.0)
Neutro Abs: 5.5 10*3/uL (ref 1.4–7.7)
Neutrophils Relative %: 70.5 % (ref 43.0–77.0)
Platelets: 331 10*3/uL (ref 150.0–400.0)
RBC: 4.38 Mil/uL (ref 4.22–5.81)
RDW: 12.3 % (ref 11.5–15.5)
WBC: 7.8 10*3/uL (ref 4.0–10.5)

## 2019-08-27 LAB — COMPREHENSIVE METABOLIC PANEL
ALT: 12 U/L (ref 0–53)
AST: 13 U/L (ref 0–37)
Albumin: 3.9 g/dL (ref 3.5–5.2)
Alkaline Phosphatase: 93 U/L (ref 39–117)
BUN: 19 mg/dL (ref 6–23)
CO2: 28 mEq/L (ref 19–32)
Calcium: 9 mg/dL (ref 8.4–10.5)
Chloride: 104 mEq/L (ref 96–112)
Creatinine, Ser: 1.05 mg/dL (ref 0.40–1.50)
GFR: 70.14 mL/min (ref >60.00)
Glucose, Bld: 99 mg/dL (ref 70–99)
Potassium: 4 mEq/L (ref 3.5–5.1)
Sodium: 138 mEq/L (ref 135–145)
Total Bilirubin: 2.1 mg/dL — ABNORMAL HIGH (ref 0.2–1.2)
Total Protein: 6.7 g/dL (ref 6.0–8.3)

## 2019-08-27 NOTE — Progress Notes (Signed)
08/27/2019 Larico Bloedorn [id number] 1951/02/04   HISTORY OF PRESENT ILLNESS: Kevin Doyle is a 69 year old male with a past medical history of remote atrial fibrillation, BPH, Gilbert's Syndrome, hyperplastic colon polyps and GERD.  S/P cholecystectomy secondary to cholelithiasis 05/2012, laparoscopic lysis of adhesions and robotic assisted partial nephrectomy secondary to benign right renal mass 06/2012.  He presents today with complaints of nausea, vomiting, upper abdominal pain and diarrhea.  He developed lower back pain on Wednesday 08/20/2019.  He stated lifting heavy materials while he was working on his home earlier that day.  On Thursday 2/04 he did not feel well and had a fever of 100.3.  He took Tylenol and his fever resolved within the next 48 hours.  He reported having a negative COVID-19 test done at week at [facility].  Sunday 2/7 he felt fine earlier in the day.  Mid afternoon he ate a [employer] apple with peanut butter, a handful of peanuts and a sweet and salty peanut bar.  Shortly thereafter,  he developed upper abdominal cramping which worsened by dinnertime.  His abdominal pain was located 2 cm above the umbilicus.  He felt a gurgling in his upper abdomen and was concerned he might be blocked up. He had difficulty sleeping at night the next morning he had dry heaves and vomited a scant amount of clear secretions x2.  He vomited a small amount of cruise and rice checks on Tuesday 2/09.  He then passed a small brown stool which floated then had 3 episodes of watery brown diarrhea yesterday night and 2 episodes of diarrhea this morning.  No bloody diarrhea or melena.  He reports feeling much better at this time.  No further fever.  He does not feel nauseous.  He reports having a history of variable bowel pattern.  He typically passes a brown pasty stool most days. History  of Giardia in 1997.  He underwent a colonoscopy in 2010 was normal.  A colonoscopy in 2005 showed  multiple hyperplastic polyps.  He denies any family history of esophageal, gastric or colorectal cancer.  Has a remote history of GERD.  He has infrequent heartburn.  No dysphagia.  No left upper quadrant abdominal pain.  He underwent an EGD in 2013 which showed mild chronic gastritis without evidence of H. Pylori.  No longer takes aspirin 81 mg daily.  He infrequently takes Advil 200 mg 1 or 2 tablets as needed for aches and pains, he has not taken any for the past month.  He drinks 1 beer having days weekly or less.  No history of excessive alcohol use.  Appetite is good.  No weight loss.  About history of atrial fibrillation in 2013 which was triggered by an episode of vertigo.  He denies having any chest pain, palpitations or shortness of breath.  His wife is present.   EGD 04/29/2012: By Dr. Verl Blalock  Abnormal gastric fundal mucosa, otherwise normal.  -Biopsies identified mild chronic inactive gastritis.  No evidence of H. pylori.  ERCP 07/11/2012 by Dr. Erskine Emery: Choledocholithiasis status post sphincterotomy and stone extraction  Colonoscopy 06/02/2009 by Dr. Verl Blalock: - No polyps -Recall colonoscopy 10 years  Colonoscopy Dr. Verl Blalock 04/08/2004: Multiple polyps from to the descending and sigmoid colon measuring 2 to 6 mm.   Past Medical History:  Diagnosis Date  . Atrial fibrillation ([place])    last episode was 02/10/13  . GERD (gastroesophageal reflux disease)   . Gilbert's disease  Past Surgical History:  Procedure Laterality Date  . CHOLECYSTECTOMY  05/18/2012   Procedure: LAPAROSCOPIC CHOLECYSTECTOMY WITH INTRAOPERATIVE CHOLANGIOGRAM;  Surgeon: Pedro Earls, MD;  Location: [facility];  Service: General;  Laterality: N/A;  . COLONOSCOPY    . ERCP  07/11/2012   Procedure: ENDOSCOPIC RETROGRADE CHOLANGIOPANCREATOGRAPHY (ERCP);  Surgeon: Inda Castle, MD;  Location: [facility];  Service: Endoscopy;  Laterality: N/A;  . Hernia repairs     x 3   .  INGUINAL HERNIA REPAIR     bilateral  . LAPAROSCOPIC LYSIS OF ADHESIONS  06/24/2012   Procedure: LAPAROSCOPIC LYSIS OF ADHESIONS;  Surgeon: Dutch Gray, MD;  Location: [facility];  Service: Urology;;  . ROBOTIC ASSITED PARTIAL NEPHRECTOMY  06/24/2012   Procedure: ROBOTIC ASSITED PARTIAL NEPHRECTOMY;  Surgeon: Dutch Gray, MD;  Location: [facility];  Service: Urology;  Laterality: Right;  . UPPER GASTROINTESTINAL ENDOSCOPY      reports that he has quit smoking. He quit smokeless tobacco use about 33 years ago. He reports current alcohol use. He reports that he does not use drugs. family history includes Atrial fibrillation in his brother and mother; Brain cancer in his father; Lung cancer in his mother. No Known Allergies    Outpatient Encounter Medications as of 08/27/2019  Medication Sig  . Ascorbic Acid (VITAMIN C) 100 MG tablet Take 500 mg by mouth daily.  . Coenzyme Q10 (COQ10) 100 MG CAPS Take by mouth.  . [DISCONTINUED] aspirin 81 MG tablet Take 81 mg by mouth daily.  . [DISCONTINUED] Multiple Vitamin (MULTI-VITAMIN DAILY PO) Take 1 tablet by mouth daily.   No facility-administered encounter medications on file as of 08/27/2019.     REVIEW OF SYSTEMS: Gen: Denies fever, sweats or chills. No weight loss.  CV: Denies chest pain, palpitations or edema. Resp: Denies cough, shortness of breath of hemoptysis.  GI: See HPI. GU : Denies urinary burning, blood in urine, increased urinary frequency or incontinence. MS: Denies joint pain, muscles aches or weakness. Derm: Denies rash, itchiness, skin lesions or unhealing ulcers. Psych: Denies depression, anxiety, memory loss, suicidal ideation and confusion. Heme: Denies bruising, bleeding. Neuro:  Denies headaches, dizziness or paresthesias. Endo:  Denies any problems with DM, thyroid or adrenal function.   PHYSICAL EXAM: BP 118/70   Pulse 84   Temp (!) 97.1 F (36.2 C)   Ht 6\' 2"  (1.88 m)   Wt 195 lb (88.5 kg)   BMI 25.04 kg/m  General:  Well developed 69 year old male no acute distress. Head: Normocephalic and atraumatic. Eyes:  Sclerae non-icteric, conjunctive pink. Ears: Normal auditory acuity. Mouth: Dentition intact. No ulcers or lesions.  Neck: Supple, no lymphadenopathy or thyromegaly.  Lungs: Clear bilaterally to auscultation without wheezes, crackles or rhonchi. Heart: Regular rate and rhythm. No murmur, rub or gallop appreciated.  Abdomen: Soft, nondistended. Mild tenderness 2 cm above the umbilicus. No masses. No hepatosplenomegaly. Normoactive bowel sounds x 4 quadrants.  Rectal: Deferred.  Musculoskeletal: Symmetrical with no gross deformities. Skin: Warm and dry. No rash or lesions on visible extremities. Extremities: No edema. Neurological: Alert oriented x 4, no focal deficits.  Psychological:  Alert and cooperative. Normal mood and affect.  ASSESSMENT AND PLAN:  22.  69 year old male with N/V, central upper abdominal pain and diarrhea which are improving -CBC with diff, CMP, CRP and celiac panel. GI pathogen panel. -Phillip's bacteria probiotic one capsule by mouth once daily x 2 to 4 weeks -Push fluids, bland diet -He declined antiemetic -I discussed scheduling  an abdominal/pelvic CT if his N/V and central abdominal pain (2cm above the umbilicus) persists or worsens, await the above lab test results -Patient to follow up in the office in 2 to 3 weeks to schedule a colonoscopy +/- EGD  2. History of hyperplastic colon polyps. Last colonoscopy was in 2010. -See plan in # 1  4.  History of GERD, no current reflux symptoms  -patient will call office if he develops reflux symptoms -consider EGD at time of colonoscopy if symptoms warrant   5. History of Gilbert's syndrome       CC:  No ref. provider found

## 2019-08-27 NOTE — Patient Instructions (Addendum)
If you are age 69 or older, your body mass index should be between 23-30. Your Body mass index is 25.04 kg/m. If this is out of the aforementioned range listed, please consider follow up with your Primary Care Provider.  If you are age 3 or younger, your body mass index should be between 19-25. Your Body mass index is 25.04 kg/m. If this is out of the aformentioned range listed, please consider follow up with your Primary Care Provider.   Your provider has requested that you go to the basement level for lab work at [address] [place], [place]. Press "B" on the elevator. The lab is located at the first door on the left as you exit the elevator.  Start Phillip's Bacteria Probiotic. Take one daily for 2-4 weeks.  Follow up in office in 2-3 weeks to schedule colonoscopy if possible.  You have an appointment for 09/10/19 at 8:30 am.  Call office if symptoms worsen.  Start a bland diet and push fluids.  Thank you for choosing me and [facility].   Carl Best, NP  Due to recent changes in healthcare laws, you may see the results of your imaging and laboratory studies on MyChart before your provider has had a chance to review them.  We understand that in some cases there may be results that are confusing or concerning to you. Not all laboratory results come back in the same time frame and the provider may be waiting for multiple results in order to interpret others.  Please give [place] 48 hours in order for your provider to thoroughly review all the results before contacting the office for clarification of your results.   Lab location :Go to [address] [place], [place] [address] (basement)

## 2019-08-28 NOTE — Progress Notes (Signed)
Agree with the assessment and plan as outlined by Colleen Kennedy-Smith, NP.   

## 2019-08-30 LAB — CELIAC PANEL 10
Antigliadin Abs, IgA: 3 units (ref 0–19)
Endomysial IgA: NEGATIVE
Gliadin IgG: 3 units (ref 0–19)
IgA/Immunoglobulin A, Serum: 220 mg/dL (ref 61–437)
Tissue Transglut Ab: 4 U/mL (ref 0–5)
Transglutaminase IgA: <2 U/mL (ref 0–3)

## 2019-09-01 ENCOUNTER — Emergency Department (HOSPITAL_COMMUNITY): Payer: Medicare Other

## 2019-09-01 ENCOUNTER — Other Ambulatory Visit: Payer: Self-pay

## 2019-09-01 ENCOUNTER — Encounter (HOSPITAL_COMMUNITY): Payer: Self-pay

## 2019-09-01 ENCOUNTER — Telehealth: Payer: Self-pay | Admitting: Nurse Practitioner

## 2019-09-01 ENCOUNTER — Inpatient Hospital Stay (HOSPITAL_COMMUNITY)
Admission: EM | Admit: 2019-09-01 | Discharge: 2019-09-03 | DRG: 390 | Disposition: A | Discharge status: Home / Self Care | Payer: Medicare Other | Attending: Internal Medicine | Admitting: Internal Medicine

## 2019-09-01 DIAGNOSIS — I10 Essential (primary) hypertension: Secondary | ICD-10-CM | POA: Diagnosis present

## 2019-09-01 DIAGNOSIS — Z8719 Personal history of other diseases of the digestive system: Secondary | ICD-10-CM

## 2019-09-01 DIAGNOSIS — K5669 Other partial intestinal obstruction: Secondary | ICD-10-CM | POA: Diagnosis present

## 2019-09-01 DIAGNOSIS — Z87891 Personal history of nicotine dependence: Secondary | ICD-10-CM | POA: Diagnosis not present

## 2019-09-01 DIAGNOSIS — Z20822 Contact with and (suspected) exposure to covid-19: Secondary | ICD-10-CM | POA: Diagnosis present

## 2019-09-01 DIAGNOSIS — K219 Gastro-esophageal reflux disease without esophagitis: Secondary | ICD-10-CM | POA: Diagnosis present

## 2019-09-01 DIAGNOSIS — N200 Calculus of kidney: Secondary | ICD-10-CM | POA: Diagnosis not present

## 2019-09-01 DIAGNOSIS — R110 Nausea: Secondary | ICD-10-CM

## 2019-09-01 DIAGNOSIS — Z905 Acquired absence of kidney: Secondary | ICD-10-CM | POA: Diagnosis not present

## 2019-09-01 DIAGNOSIS — I4891 Unspecified atrial fibrillation: Secondary | ICD-10-CM | POA: Diagnosis not present

## 2019-09-01 DIAGNOSIS — Z79899 Other long term (current) drug therapy: Secondary | ICD-10-CM | POA: Diagnosis not present

## 2019-09-01 DIAGNOSIS — Z9049 Acquired absence of other specified parts of digestive tract: Secondary | ICD-10-CM | POA: Diagnosis not present

## 2019-09-01 DIAGNOSIS — Z801 Family history of malignant neoplasm of trachea, bronchus and lung: Secondary | ICD-10-CM | POA: Diagnosis not present

## 2019-09-01 DIAGNOSIS — Z808 Family history of malignant neoplasm of other organs or systems: Secondary | ICD-10-CM | POA: Diagnosis not present

## 2019-09-01 DIAGNOSIS — R1033 Periumbilical pain: Secondary | ICD-10-CM

## 2019-09-01 DIAGNOSIS — K56609 Unspecified intestinal obstruction, unspecified as to partial versus complete obstruction: Secondary | ICD-10-CM

## 2019-09-01 DIAGNOSIS — R030 Elevated blood-pressure reading, without diagnosis of hypertension: Secondary | ICD-10-CM

## 2019-09-01 DIAGNOSIS — N400 Enlarged prostate without lower urinary tract symptoms: Secondary | ICD-10-CM | POA: Diagnosis present

## 2019-09-01 LAB — URINALYSIS, ROUTINE W REFLEX MICROSCOPIC
Bacteria, UA: NONE SEEN
Bilirubin Urine: NEGATIVE
Glucose, UA: NEGATIVE mg/dL
Ketones, ur: 80 mg/dL — AB
Leukocytes,Ua: NEGATIVE
Nitrite: NEGATIVE
Protein, ur: 30 mg/dL — AB
RBC / HPF: >50 RBC/hpf — ABNORMAL HIGH (ref 0–5)
Specific Gravity, Urine: 1.019 (ref 1.005–1.030)
pH: 6 (ref 5.0–8.0)

## 2019-09-01 LAB — COMPREHENSIVE METABOLIC PANEL
ALT: 13 U/L (ref 0–44)
AST: 14 U/L — ABNORMAL LOW (ref 15–41)
Albumin: 3.6 g/dL (ref 3.5–5.0)
Alkaline Phosphatase: 70 U/L (ref 38–126)
Anion gap: 10 (ref 5–15)
BUN: 13 mg/dL (ref 8–23)
CO2: 23 mmol/L (ref 22–32)
Calcium: 8.5 mg/dL — ABNORMAL LOW (ref 8.9–10.3)
Chloride: 107 mmol/L (ref 98–111)
Creatinine, Ser: 0.94 mg/dL (ref 0.61–1.24)
GFR calc Af Amer: >60 mL/min (ref >60)
GFR calc non Af Amer: >60 mL/min (ref >60)
Glucose, Bld: 105 mg/dL — ABNORMAL HIGH (ref 70–99)
Potassium: 4 mmol/L (ref 3.5–5.1)
Sodium: 140 mmol/L (ref 135–145)
Total Bilirubin: 1.9 mg/dL — ABNORMAL HIGH (ref 0.3–1.2)
Total Protein: 6.4 g/dL — ABNORMAL LOW (ref 6.5–8.1)

## 2019-09-01 LAB — CBC
HCT: 41.3 % (ref 39.0–52.0)
Hemoglobin: 13.7 g/dL (ref 13.0–17.0)
MCH: 30.3 pg (ref 26.0–34.0)
MCHC: 33.2 g/dL (ref 30.0–36.0)
MCV: 91.4 fL (ref 80.0–100.0)
Platelets: 286 10*3/uL (ref 150–400)
RBC: 4.52 MIL/uL (ref 4.22–5.81)
RDW: 12 % (ref 11.5–15.5)
WBC: 9.6 10*3/uL (ref 4.0–10.5)
nRBC: 0 % (ref 0.0–0.2)

## 2019-09-01 LAB — LIPASE, BLOOD: Lipase: 25 U/L (ref 11–51)

## 2019-09-01 LAB — BILIRUBIN, DIRECT: Bilirubin, Direct: 0.39 mg/dL (ref 0.00–0.40)

## 2019-09-01 LAB — SPECIMEN STATUS REPORT

## 2019-09-01 MED ORDER — ACETAMINOPHEN 325 MG PO TABS: 650.0000 mg | ORAL_TABLET | Freq: Four times a day (QID) | ORAL | Status: DC | PRN

## 2019-09-01 MED ORDER — SODIUM CHLORIDE 0.9 % IV BOLUS
1000.0000 mL | Freq: Once | INTRAVENOUS | Status: AC
  Administered 2019-09-01: 1000 mL via INTRAVENOUS

## 2019-09-01 MED ORDER — IOHEXOL 300 MG/ML  SOLN
100.0000 mL | Freq: Once | INTRAMUSCULAR | Status: AC | PRN
  Administered 2019-09-01: 100 mL via INTRAVENOUS

## 2019-09-01 MED ORDER — ONDANSETRON HCL 4 MG/2ML IJ SOLN: 4.0000 mg | Freq: Four times a day (QID) | INTRAMUSCULAR | Status: DC | PRN

## 2019-09-01 MED ORDER — SODIUM CHLORIDE 0.9% FLUSH
3.0000 mL | Freq: Once | INTRAVENOUS | Status: AC
  Administered 2019-09-01: 16:00:00 3 mL via INTRAVENOUS

## 2019-09-01 MED ORDER — ACETAMINOPHEN 650 MG RE SUPP: 650.0000 mg | Freq: Four times a day (QID) | RECTAL | Status: DC | PRN

## 2019-09-01 MED ORDER — ONDANSETRON HCL 4 MG/2ML IJ SOLN
4.0000 mg | Freq: Once | INTRAMUSCULAR | Status: DC
  Filled 2019-09-01: qty 2

## 2019-09-01 MED ORDER — SODIUM CHLORIDE 0.9 % IV SOLN
INTRAVENOUS | Status: DC
  Administered 2019-09-02: 75 mL/h via INTRAVENOUS

## 2019-09-01 MED ORDER — SODIUM CHLORIDE (PF) 0.9 % IJ SOLN
INTRAMUSCULAR | Status: AC
  Filled 2019-09-01: qty 50

## 2019-09-01 NOTE — Telephone Encounter (Signed)
Bre, pls contact the patient, I previously discussed scheduling an abd/pelvic CT with oral and IV contrast if his symptoms persisted. Pleas schedule the CT. His bun/cr are normal. No allergies. Pls contact the lab to verify status of gi panel thx

## 2019-09-01 NOTE — ED Provider Notes (Signed)
[facility] DEPT Provider Note   CSN: [id number] Arrival date & time: 09/01/19  1425     History Chief Complaint  Patient presents with  . Abdominal Pain    Kevin Doyle is a 69 y.o. male with PMHx A fib (last episode 2014, not currently on anticoags), GERD, and Gilbert's disease who presents to the ED today with complaint of gradual onset, intermittent, upper abdominal pain approximately 2 cm above umbilicus x 1 week.  She also complains of nausea, nonbloody nonbilious emesis, looser stools.  He reports that he was seen by the [facility] about 1 week ago for similar symptoms -had lab work done at that time, plan for future colonoscopy/possible endoscopy, scheduled to see them again 2/24 to discuss further management.  Patient states that the pain subsided over the weekend however this morning he got so severe that he was dry heaving excessively.  He attempted to call GI office but did not hear back from them so came to the ED for further evaluation.  Reports he had his gallbladder removed in November 2013 and then had a left over stone that needed to be removed a couple of months later - pt states this pain feels very similar to then. He reports having a fever about 3 weeks ago that was unrelated to this abdominal pain - states he had a COVID test which was negative. Pt denies any recent fevers, chills, chest pain, SOB, hematemesis, coffee ground emesis, melena, BRBPR, urinary symptoms, testicular pain, or any other associated symptoms.   Per chart review: [facility] office visit 08/27/2019 ASSESSMENT AND PLAN:  22.  69 year old male with N/V, central upper abdominal pain and diarrhea which are improving -CBC with diff, CMP, CRP and celiac panel. GI pathogen panel. -Phillip's bacteria probiotic one capsule by mouth once daily x 2 to 4 weeks -Push fluids, bland diet -He declined antiemetic -I discussed scheduling an abdominal/pelvic CT if his N/V and central  abdominal pain (2cm above the umbilicus) persists or worsens, await the above lab test results -Patient to follow up in the office in 2 to 3 weeks to schedule a colonoscopy +/- EGD  2. History of hyperplastic colon polyps. Last colonoscopy was in 2010. -See plan in # 1  4.  History of GERD, no current reflux symptoms  -patient will call office if he develops reflux symptoms -consider EGD at time of colonoscopy if symptoms warrant   5. History of Gilbert's syndrome    The history is provided by the patient and medical records.       Past Medical History:  Diagnosis Date  . Atrial fibrillation ([place])    last episode was 02/10/13  . GERD (gastroesophageal reflux disease)   . Gilbert's disease     Patient Active Problem List   Diagnosis Date Noted  . SBO (small bowel obstruction) ([place]) 09/01/2019  . Renal calculi 09/01/2019  . Elevated blood pressure reading 09/01/2019  . Nausea with vomiting 08/27/2019  . Central abdominal pain 08/27/2019  . Diarrhea 08/27/2019  . BPH (benign prostatic hyperplasia) 09/25/2013  . Gilbert's syndrome 09/25/2013  . Family history of arrhythmia 02/10/2013  . Orthostatic hypotension 02/10/2013  . Dizzy 02/10/2013  . Atrial fibrillation ([place]) 02/10/2013  . Normocytic anemia 07/10/2012  . Elevated LFTs 07/10/2012  . S/P laparoscopic cholecystectomy Nove 2013 05/23/2012  . Renal mass, right 05/19/2012    Past Surgical History:  Procedure Laterality Date  . CHOLECYSTECTOMY  05/18/2012   Procedure: LAPAROSCOPIC CHOLECYSTECTOMY WITH INTRAOPERATIVE  CHOLANGIOGRAM;  Surgeon: Pedro Earls, MD;  Location: [facility];  Service: General;  Laterality: N/A;  . COLONOSCOPY    . ERCP  07/11/2012   Procedure: ENDOSCOPIC RETROGRADE CHOLANGIOPANCREATOGRAPHY (ERCP);  Surgeon: Inda Castle, MD;  Location: [facility];  Service: Endoscopy;  Laterality: N/A;  . Hernia repairs     x 3   . INGUINAL HERNIA REPAIR     bilateral  . LAPAROSCOPIC LYSIS OF  ADHESIONS  06/24/2012   Procedure: LAPAROSCOPIC LYSIS OF ADHESIONS;  Surgeon: Dutch Gray, MD;  Location: [facility];  Service: Urology;;  . ROBOTIC ASSITED PARTIAL NEPHRECTOMY  06/24/2012   Procedure: ROBOTIC ASSITED PARTIAL NEPHRECTOMY;  Surgeon: Dutch Gray, MD;  Location: [facility];  Service: Urology;  Laterality: Right;  . UPPER GASTROINTESTINAL ENDOSCOPY         Family History  Problem Relation Age of Onset  . Lung cancer Mother   . Atrial fibrillation Mother   . Brain cancer Father   . Atrial fibrillation Brother   . Colon cancer Neg Hx     Social History   Tobacco Use  . Smoking status: Former Research scientist (life sciences)  . Smokeless tobacco: Former Systems developer    Quit date: 06/19/1986  . Tobacco comment: Quit at age 48   Substance Use Topics  . Alcohol use: Yes    Comment: Rare-Beer   . Drug use: No    Home Medications Prior to Admission medications   Medication Sig Start Date End Date Taking? Authorizing Provider  Ascorbic Acid (VITAMIN C) 100 MG tablet Take 500 mg by mouth daily.   Yes [provider]  Coenzyme Q10 (COQ10) 100 MG CAPS Take 100 mg by mouth daily.    Yes [provider]  Lactobacillus (PROBIOTIC ACIDOPHILUS PO) Take 1 capsule by mouth daily.   Yes [provider]    Allergies    Patient has no known allergies.  Review of Systems   Review of Systems  Constitutional: Negative for chills and fever.  Respiratory: Negative for cough and shortness of breath.   Cardiovascular: Negative for chest pain.  Gastrointestinal: Positive for abdominal pain, diarrhea, nausea and vomiting. Negative for blood in stool and constipation.  Genitourinary: Negative for difficulty urinating, flank pain, frequency and testicular pain.  All other systems reviewed and are negative.   Physical Exam Updated Vital Signs BP (!) 162/95 (BP Location: Left Arm)   Pulse 92   Temp 98.4 F (36.9 C) (Oral)   Resp 20   Ht 6\' 2"  (1.88 m)   Wt 86.2 kg   SpO2 98%   BMI 24.39 kg/m    Physical Exam Vitals and nursing note reviewed.  Constitutional:      Appearance: He is not ill-appearing or diaphoretic.  HENT:     Head: Normocephalic and atraumatic.  Eyes:     Conjunctiva/sclera: Conjunctivae normal.  Cardiovascular:     Rate and Rhythm: Normal rate and regular rhythm.     Heart sounds: Normal heart sounds.  Pulmonary:     Effort: Pulmonary effort is normal.     Breath sounds: Normal breath sounds. No wheezing, rhonchi or rales.  Abdominal:     General: Abdomen is flat.     Palpations: Abdomen is soft.     Tenderness: There is abdominal tenderness in the periumbilical area. There is no right CVA tenderness, left CVA tenderness, guarding or rebound. Negative signs include McBurney's sign.    Musculoskeletal:     Cervical back: Neck supple.  Skin:  General: Skin is warm and dry.  Neurological:     Mental Status: He is alert.      ED Results / Procedures / Treatments   Labs (all labs ordered are listed, but only abnormal results are displayed) Labs Reviewed  COMPREHENSIVE METABOLIC PANEL - Abnormal; Notable for the following components:      Result Value   Glucose, Bld 105 (*)    Calcium 8.5 (*)    Total Protein 6.4 (*)    AST 14 (*)    Total Bilirubin 1.9 (*)    All other components within normal limits  URINALYSIS, ROUTINE W REFLEX MICROSCOPIC - Abnormal; Notable for the following components:   Color, Urine AMBER (*)    APPearance HAZY (*)    Hgb urine dipstick LARGE (*)    Ketones, ur 80 (*)    Protein, ur 30 (*)    RBC / HPF >50 (*)    All other components within normal limits  SARS CORONAVIRUS 2 (TAT 6-24 HRS)  LIPASE, BLOOD  CBC  HIV ANTIBODY (ROUTINE TESTING W REFLEX)  CBC  COMPREHENSIVE METABOLIC PANEL    EKG None  Radiology CT Abdomen Pelvis W Contrast  Result Date: 09/01/2019 CLINICAL DATA:  Nausea vomiting and diarrhea. Nonlocalized intermittent abdominal pain for 1 week. EXAM: CT ABDOMEN AND PELVIS WITH CONTRAST  TECHNIQUE: Multidetector CT imaging of the abdomen and pelvis was performed using the standard protocol following bolus administration of intravenous contrast. CONTRAST:  171mL OMNIPAQUE IOHEXOL 300 MG/ML  SOLN COMPARISON:  12/11/2012 FINDINGS: Lower chest: No acute abnormality. Hepatobiliary: Status post cholecystectomy. No suspicious liver abnormality. Mild intrahepatic bile duct dilatation with fusiform dilatation of the CBD measuring up to 1.1 cm. Pancreas: Unremarkable. No pancreatic ductal dilatation or surrounding inflammatory changes. Spleen: Normal in size without focal abnormality. Adrenals/Urinary Tract: Normal adrenal glands. 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 Bilateral kidney stones. The largest stone is in the left upper pole measuring 7 mm. Cortical defect within upper pole of left kidney may also represent changes from partial nephrectomy. Urinary bladder is negative. Stomach/Bowel: Stomach is nondistended. There is abnormal mid small bowel dilatation measuring up to 5.3 cm compatible with small bowel obstruction. The transition point is in the right lower quadrant of the abdomen, image 61/2. Here, there is a segment of inflamed small bowel with abnormal wall thickening, mucosal enhancement and inflammation compatible with segmental enteritis. Decompressed terminal ileum noted. Colon is also decompressed the. Vascular/Lymphatic: No significant vascular findings are present. No enlarged abdominal or pelvic lymph nodes. Reproductive: Prostate is unremarkable. Other: Moderate free fluid within the pelvis. No abscess or pneumoperitoneum. Musculoskeletal: No acute or significant osseous findings. IMPRESSION: 1. Examination is positive for small bowel obstruction. Transition point is in the right lower quadrant of the abdomen. 2. Inflamed segment of small bowel identified within the right lower quadrant of the abdomen and access the transition point for the bowel obstruction. Compatible  with segmental enteritis. Findings may be inflammatory or infectious in etiology. Inflammatory bowel disease not excluded. 3. Moderate free fluid noted within the pelvis. No abscess or pneumoperitoneum. 4. Status post cholecystectomy with mild intrahepatic bile duct dilatation and fusiform dilatation of the CBD. 5. Bilateral nonobstructing renal calculi. Electronically Signed   By: Kerby Moors M.D.   On: 09/01/2019 17:41    Procedures Procedures (including critical care time)  Medications Ordered in ED Medications  ondansetron (ZOFRAN) injection 4 mg (0 mg Intravenous Hold 09/01/19 1548)  acetaminophen (TYLENOL) tablet 650 mg (has  no administration in time range)    Or  acetaminophen (TYLENOL) suppository 650 mg (has no administration in time range)  0.9 %  sodium chloride infusion ( Intravenous New Bag/Given 09/01/19 2039)  ondansetron (ZOFRAN) injection 4 mg (has no administration in time range)  sodium chloride flush (NS) 0.9 % injection 3 mL (3 mLs Intravenous Given 09/01/19 1548)  sodium chloride 0.9 % bolus 1,000 mL (0 mLs Intravenous Stopped 09/01/19 1810)  iohexol (OMNIPAQUE) 300 MG/ML solution 100 mL (100 mLs Intravenous Contrast Given 09/01/19 1715)  sodium chloride (PF) 0.9 % injection (  Duplicate [id number])    ED Course  I have reviewed the triage vital signs and the nursing notes.  Pertinent labs & imaging results that were available during my care of the patient were reviewed by me and considered in my medical decision making (see chart for details).  69 year old male who presents to the ED with complaint of intermittent supra umbilical abd pain, nausea, and emesis x 1 week. Intermittent looser stools. Seen by GI 5 days ago with labwork including celiac markers which were negative. Plan to see them again on 2/24 for further eval to discuss need for colonoscopy and +/- endoscopy. Coming today for worsening abdominal pain this morning however has resolved during exam. Per chart  review there was plan for CT scan if pain continued; will perform today. Labwork also obtained. Fluids and nausea medicine given.   CBC without leukocytosis. Hgb stable.  CMP with t bili 1.9; pt with history of Gilberts disease. No other electrolyte abnormalities.  Lipase within normal limits.   CT scan with concern for SBO. Will consult general surgery at this time.   Clinical Course as of Aug 31 2313  Mon Sep 01, 2019  [id number] Discussed case with Dr. Brantley Stage with general surgery; recommends NG tube to decompress bowel as bowel does look fairly dilated on CT scan. Recommends medicine admission.    [MV]  [id number] Discussed case with Dr. Flossie Buffy who agrees to accept patient for admission   [MV]    Clinical Course User Index [MV] Eustaquio Maize, PA-C   MDM Rules/Calculators/A&P                       Final Clinical Impression(s) / ED Diagnoses Final diagnoses:  SBO (small bowel obstruction) ([place])  Periumbilical abdominal pain  Nausea    Rx / DC Orders ED Discharge Orders    None       Eustaquio Maize, PA-C 09/01/19 2316    Varney Biles, MD 09/03/19 1723

## 2019-09-01 NOTE — H&P (Signed)
History and Physical    [name]:[id number] DOB: 02/21/1951 DOA: 09/01/2019  PCP: None  Patient coming from: home, lives with wife I have personally briefly reviewed patient's old medical records in [facility]  Chief Complaint: abdominal pain  HPI: Kevin Doyle is a 69 y.o. male with medical history significant for remote atrial fibrillation, BPH, Gilbert's Syndrome, hyperplastic colon polyps and GERD.  S/P cholecystectomy secondary to cholelithiasis 05/2012, laparoscopic lysis of adhesions and robotic assisted partial nephrectomy secondary to benign right renal mass 06/2012 who presented with persistent abdominal pain.  About a week ago he developed sudden onset upper abdominal cramping pain. Pain would come and go along his upper abdomen and midline to his umbilicus area. There was associated nausea and minimal vomiting in the beginning. Mostly dry heaving.  He was then evaluated by  GI for these symptoms on 2/10 and lab work including celiac panel that was negative. GI pathogen panel still pending.  There was initial plans for CT abdomen/pelvis if symptoms worsened and a planned colonoscopy in a few week. Symptoms improved for a few days after seeing GI but then 3 days ago it came back. It got better after he laid on his stomach. Then yesterday he had more food than usual and it aggravated the pain again. Pain is not usually associated with food except for this time.  He  Had 3 bowel movements today that was progressively looser.  Denies any urinary frequency, dysuria or  Hematuria.  He also endorse 3 weeks ago he noted lower back pain [place] and also had a few days of fever which resolved. He was tested for COVID at that time and was negative.    ED Course:  He was afebrile, hypertensive up to 170s and comfortable on room air.   Labs unremarkable except for mildly elevated bilirubin of 1.9  CT abdomen pelvis showed SBO with transition  points in the right lower abdomen, segmental enteritis of the small bowel and bilateral non-obstructing renal calculi.   Review of Systems:  Constitutional: No Weight Change, No Fever ENT/Mouth: No sore throat, No Rhinorrhea Eyes: No Eye Pain, No Vision Changes Cardiovascular: No Chest Pain, no SOB Respiratory: No Cough, No Sputum Gastrointestinal: + Nausea, + Vomiting, No Diarrhea, No Constipation, + Pain Genitourinary: no Urinary Incontinence,  Musculoskeletal: No Arthralgias, No Myalgias Skin: No Skin Lesions, No Pruritus, Neuro: no Weakness, No Numbness,   Psych: No Anxiety/Panic, No Depression, no decrease appetite Heme/Lymph: No Bruising, No Bleeding  Past Medical History:  Diagnosis Date  . Atrial fibrillation ([facility])    last episode was 02/10/13  . GERD (gastroesophageal reflux disease)   . Gilbert's disease     Past Surgical History:  Procedure Laterality Date  . CHOLECYSTECTOMY  05/18/2012   Procedure: LAPAROSCOPIC CHOLECYSTECTOMY WITH INTRAOPERATIVE CHOLANGIOGRAM;  Surgeon: Valarie Merino, MD;  Location: [facility];  Service: General;  Laterality: N/A;  . COLONOSCOPY    . ERCP  07/11/2012   Procedure: ENDOSCOPIC RETROGRADE CHOLANGIOPANCREATOGRAPHY (ERCP);  Surgeon: Louis Meckel, MD;  Location: [facility];  Service: Endoscopy;  Laterality: N/A;  . Hernia repairs     x 3   . INGUINAL HERNIA REPAIR     bilateral  . LAPAROSCOPIC LYSIS OF ADHESIONS  06/24/2012   Procedure: LAPAROSCOPIC LYSIS OF ADHESIONS;  Surgeon: Crecencio Mc, MD;  Location: [facility];  Service: Urology;;  . ROBOTIC ASSITED PARTIAL NEPHRECTOMY  06/24/2012   Procedure: ROBOTIC ASSITED PARTIAL NEPHRECTOMY;  Surgeon: Crecencio Mc, MD;  Location: [facility];  Service: Urology;  Laterality: Right;  . UPPER GASTROINTESTINAL ENDOSCOPY       reports that he has quit smoking. He quit smokeless tobacco use about 33 years ago. He reports current alcohol use. He reports that he does not use drugs.  No Known  Allergies  Family History  Problem Relation Age of Onset  . Lung cancer Mother   . Atrial fibrillation Mother   . Brain cancer Father   . Atrial fibrillation Brother   . Colon cancer Neg Hx      Prior to Admission medications   Medication Sig Start Date End Date Taking? Authorizing Provider  Ascorbic Acid (VITAMIN C) 100 MG tablet Take 500 mg by mouth daily.   Yes [provider]  Coenzyme Q10 (COQ10) 100 MG CAPS Take 100 mg by mouth daily.    Yes [provider]  Lactobacillus (PROBIOTIC ACIDOPHILUS PO) Take 1 capsule by mouth daily.   Yes [provider]    Physical Exam: Vitals:   09/01/19 1436 09/01/19 1800  BP: (!) 162/95 (!) 173/91  Pulse: 92 78  Resp: 20 18  Temp: 98.4 F (36.9 C)   TempSrc: Oral   SpO2: 98% 99%  Weight: 86.2 kg   Height: 6\' 2"  (1.88 m)     Constitutional: NAD, calm, comfortable, well-appearing male laying at 20 degree incline in bed Vitals:   09/01/19 1436 09/01/19 1800  BP: (!) 162/95 (!) 173/91  Pulse: 92 78  Resp: 20 18  Temp: 98.4 F (36.9 C)   TempSrc: Oral   SpO2: 98% 99%  Weight: 86.2 kg   Height: 6\' 2"  (1.88 m)    Eyes: PERRL, lids and conjunctivae normal ENMT: Mucous membranes are moist.  Neck: normal, supple Respiratory: clear to auscultation bilaterally, no wheezing, no crackles. Normal respiratory effort on room air. No accessory muscle use.  Cardiovascular: Regular rate and rhythm, no murmurs / rubs / gallops. No extremity edema. 2+ pedal pulses.  Abdomen: Moderately distended abdomen with mild palpation of the lower quadrants.  No rigidity or rebound tenderness or guarding.  Hypoactive bowel sounds best heard at left lower quadrant. No CVA tenderness.  Musculoskeletal: no clubbing / cyanosis. No joint deformity upper and lower extremities. Good ROM, no contractures. Normal muscle tone.  Skin: no rashes, lesions, ulcers. No induration Neurologic: CN 2-12 grossly intact. Sensation intact. Strength  5/5 in all 4.  Psychiatric: Normal judgment and insight. Alert and oriented x 3. Normal mood.     Labs on Admission: I have personally reviewed following labs and imaging studies  CBC: Recent Labs  Lab 08/27/19 1023 09/01/19 1440  WBC 7.8 9.6  NEUTROABS 5.5  --   HGB 13.5 13.7  HCT 39.0 41.3  MCV 89.1 91.4  PLT 331.0 286   Basic Metabolic Panel: Recent Labs  Lab 08/27/19 1023 09/01/19 1440  NA 138 140  K 4.0 4.0  CL 104 107  CO2 28 23  GLUCOSE 99 105*  BUN 19 13  CREATININE 1.05 0.94  CALCIUM 9.0 8.5*   GFR: Estimated Creatinine Clearance: 87.4 mL/min (by C-G formula based on SCr of 0.94 mg/dL). Liver Function Tests: Recent Labs  Lab 08/27/19 1023 09/01/19 1440  AST 13 14*  ALT 12 13  ALKPHOS 93 70  BILITOT 2.1* 1.9*  PROT 6.7 6.4*  ALBUMIN 3.9 3.6   Recent Labs  Lab 09/01/19 1440  LIPASE 25   No results for input(s): AMMONIA in  the last 168 hours. Coagulation Profile: No results for input(s): INR, PROTIME in the last 168 hours. Cardiac Enzymes: No results for input(s): CKTOTAL, CKMB, CKMBINDEX, TROPONINI in the last 168 hours. BNP (last 3 results) No results for input(s): PROBNP in the last 8760 hours. HbA1C: No results for input(s): HGBA1C in the last 72 hours. CBG: No results for input(s): GLUCAP in the last 168 hours. Lipid Profile: No results for input(s): CHOL, HDL, LDLCALC, TRIG, CHOLHDL, LDLDIRECT in the last 72 hours. Thyroid Function Tests: No results for input(s): TSH, T4TOTAL, FREET4, T3FREE, THYROIDAB in the last 72 hours. Anemia Panel: No results for input(s): VITAMINB12, FOLATE, FERRITIN, TIBC, IRON, RETICCTPCT in the last 72 hours. Urine analysis:    Component Value Date/Time   COLORURINE AMBER (A) 09/01/2019 1709   APPEARANCEUR HAZY (A) 09/01/2019 1709   LABSPEC 1.019 09/01/2019 1709   PHURINE 6.0 09/01/2019 1709   GLUCOSEU NEGATIVE 09/01/2019 1709   HGBUR LARGE (A) 09/01/2019 1709   BILIRUBINUR NEGATIVE 09/01/2019 1709    BILIRUBINUR neg 09/25/2013 1422   KETONESUR 80 (A) 09/01/2019 1709   PROTEINUR 30 (A) 09/01/2019 1709   UROBILINOGEN negative 09/25/2013 1422   UROBILINOGEN 4.0 (H) 07/10/2012 0428   NITRITE NEGATIVE 09/01/2019 1709   LEUKOCYTESUR NEGATIVE 09/01/2019 1709    Radiological Exams on Admission: CT Abdomen Pelvis W Contrast  Result Date: 09/01/2019 CLINICAL DATA:  Nausea vomiting and diarrhea. Nonlocalized intermittent abdominal pain for 1 week. EXAM: CT ABDOMEN AND PELVIS WITH CONTRAST TECHNIQUE: Multidetector CT imaging of the abdomen and pelvis was performed using the standard protocol following bolus administration of intravenous contrast. CONTRAST:  OMNIPAQUE IOHEXOL 300 MG/ML  SOLN COMPARISON:  12/11/2012 FINDINGS: Lower chest: No acute abnormality. Hepatobiliary: Status post cholecystectomy. No suspicious liver abnormality. Mild intrahepatic bile duct dilatation with fusiform dilatation of the CBD measuring up to 1.1 cm. Pancreas: Unremarkable. No pancreatic ductal dilatation or surrounding inflammatory changes. Spleen: Normal in size without focal abnormality. Adrenals/Urinary Tract: Normal adrenal glands. 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 Bilateral kidney stones. The largest stone is in the left upper pole measuring 7 mm. Cortical defect within upper pole of left kidney may also represent changes from partial nephrectomy. Urinary bladder is negative. Stomach/Bowel: Stomach is nondistended. There is abnormal mid small bowel dilatation measuring up to 5.3 cm compatible with small bowel obstruction. The transition point is in the right lower quadrant of the abdomen, image 61/2. Here, there is a segment of inflamed small bowel with abnormal wall thickening, mucosal enhancement and inflammation compatible with segmental enteritis. Decompressed terminal ileum noted. Colon is also decompressed the. Vascular/Lymphatic: No significant vascular findings are present. No enlarged  abdominal or pelvic lymph nodes. Reproductive: Prostate is unremarkable. Other: Moderate free fluid within the pelvis. No abscess or pneumoperitoneum. Musculoskeletal: No acute or significant osseous findings. IMPRESSION: 1. Examination is positive for small bowel obstruction. Transition point is in the right lower quadrant of the abdomen. 2. Inflamed segment of small bowel identified within the right lower quadrant of the abdomen and access the transition point for the bowel obstruction. Compatible with segmental enteritis. Findings may be inflammatory or infectious in etiology. Inflammatory bowel disease not excluded. 3. Moderate free fluid noted within the pelvis. No abscess or pneumoperitoneum. 4. Status post cholecystectomy with mild intrahepatic bile duct dilatation and fusiform dilatation of the CBD. 5. Bilateral nonobstructing renal calculi. Electronically Signed   By: Signa Kell M.D.   On: 09/01/2019 17:41      Assessment/Plan  Small bowel  obstruction NG tube placed for decompression Keep NPO  Continue IV fluid Appreciate surgery assistance-recommended possible CT enterography  Elevated bilirubin in the setting of Gilbert's syndrome Stable   Elevated hypertension Likely secondary to pain. Continue to monitor.   Bilateral non-obstructing renal calculi Largest measuring 7 mm Incidental finding on CT abdomen pelvis Asymptomatic.  Continue to monitor for any changes in symptoms.  Atrial fibrillation Currently in normal sinus rhythm Not on anticoagulation  DVT prophylaxis: SCDs Code Status: Full Family Communication: Plan discussed with patient at bedside  disposition Plan: Home with at least 2 midnight stays  Consults called: Surgery Admission status: inpatient due to SBO requiring general surgery consultation  Adrinne Sze T Yareth Kearse DO [facility]   If 7PM-7AM, please contact night-coverage [email or web address]   09/01/2019, 6:51 PM

## 2019-09-01 NOTE — Telephone Encounter (Signed)
Left message on phone number that patient previously called from; also attempted to reach patient at home number-no answer, unable to leave a VM; will attempt to reach patient at a later date/time;

## 2019-09-01 NOTE — ED Triage Notes (Signed)
Patient reports that he has mid upper abdominal pain a week ago and reports that he saw a NP in [place] for the same.

## 2019-09-01 NOTE — Telephone Encounter (Signed)
Please advise of next step in patient's plan of care

## 2019-09-01 NOTE — Telephone Encounter (Signed)
Patient's wife-Amy-verified DPR- reports the patient is currently in the ER as the nausea/vomiting/diarrhea "got so bad and he has feeling so bad for so long he just needed to be seen immediately";  Amy reports she will call or have the patient call back to the office should further questions/concerns arise;

## 2019-09-01 NOTE — Telephone Encounter (Signed)
Pt's wife reported that pt is still experiencing N/V and diarrhea.  Please advise.

## 2019-09-01 NOTE — Consult Note (Signed)
Reason for Consult: Small bowel obstruction Referring Physician: Kathrynn Humble MD  Kevin Doyle is an 69 y.o. male.  HPI: Asked see patient at the request of Dr. Kathrynn Humble for a 1 week history of abdominal pain.  He states he has had nausea, vomiting and diarrhea for a week.  He was seen last week about 5 days ago followed by our GI who recommended a liquid diet.  His pain is moderate to had a 5 location just above the umbilicus.  He had 3 bowel movements this morning.  He has had intermittent nausea and vomiting but none today.  He is quite bloated.  Denies any history of inflammatory bowel disease, blood in stool but has had a laparoscopic cholecystectomy and a laparoscopic partial nephrectomy back in 2014.  No other abdominal surgery  Past Medical History:  Diagnosis Date  . Atrial fibrillation ([place])    last episode was 02/10/13  . GERD (gastroesophageal reflux disease)   . Gilbert's disease     Past Surgical History:  Procedure Laterality Date  . CHOLECYSTECTOMY  05/18/2012   Procedure: LAPAROSCOPIC CHOLECYSTECTOMY WITH INTRAOPERATIVE CHOLANGIOGRAM;  Surgeon: Pedro Earls, MD;  Location: [facility];  Service: General;  Laterality: N/A;  . COLONOSCOPY    . ERCP  07/11/2012   Procedure: ENDOSCOPIC RETROGRADE CHOLANGIOPANCREATOGRAPHY (ERCP);  Surgeon: Inda Castle, MD;  Location: [facility];  Service: Endoscopy;  Laterality: N/A;  . Hernia repairs     x 3   . INGUINAL HERNIA REPAIR     bilateral  . LAPAROSCOPIC LYSIS OF ADHESIONS  06/24/2012   Procedure: LAPAROSCOPIC LYSIS OF ADHESIONS;  Surgeon: Dutch Gray, MD;  Location: [facility];  Service: Urology;;  . ROBOTIC ASSITED PARTIAL NEPHRECTOMY  06/24/2012   Procedure: ROBOTIC ASSITED PARTIAL NEPHRECTOMY;  Surgeon: Dutch Gray, MD;  Location: [facility];  Service: Urology;  Laterality: Right;  . UPPER GASTROINTESTINAL ENDOSCOPY      Family History  Problem Relation Age of Onset  . Lung cancer Mother   . Atrial fibrillation Mother   . Brain cancer  Father   . Atrial fibrillation Brother   . Colon cancer Neg Hx     Social History:  reports that he has quit smoking. He quit smokeless tobacco use about 33 years ago. He reports current alcohol use. He reports that he does not use drugs.  Allergies: No Known Allergies  Medications: I have reviewed the patient's current medications.  Results for orders placed or performed during the hospital encounter of 09/01/19 (from the past 48 hour(s))  Lipase, blood     Status: None   Collection Time: 09/01/19  2:40 PM  Result Value Ref Range   Lipase 25 11 - 51 U/L    Comment: Performed at [facility], [place] [address], [place], [place] [address]  Comprehensive metabolic panel     Status: Abnormal   Collection Time: 09/01/19  2:40 PM  Result Value Ref Range   Sodium 140 135 - 145 mmol/L   Potassium 4.0 3.5 - 5.1 mmol/L   Chloride 107 98 - 111 mmol/L   CO2 23 22 - 32 mmol/L   Glucose, Bld 105 (H) 70 - 99 mg/dL   BUN 13 8 - 23 mg/dL   Creatinine, Ser 0.94 0.61 - 1.24 mg/dL   Calcium 8.5 (L) 8.9 - 10.3 mg/dL   Total Protein 6.4 (L) 6.5 - 8.1 g/dL   Albumin 3.6 3.5 - 5.0 g/dL   AST 14 (L) 15 - 41 U/L  ALT 13 0 - 44 U/L   Alkaline Phosphatase 70 38 - 126 U/L   Total Bilirubin 1.9 (H) 0.3 - 1.2 mg/dL   GFR calc non Af Amer >60 >60 mL/min   GFR calc Af Amer >60 >60 mL/min   Anion gap 10 5 - 15    Comment: Performed at [facility], [place] [address], [place], [place] [address]  CBC     Status: None   Collection Time: 09/01/19  2:40 PM  Result Value Ref Range   WBC 9.6 4.0 - 10.5 K/uL   RBC 4.52 4.22 - 5.81 MIL/uL   Hemoglobin 13.7 13.0 - 17.0 g/dL   HCT 41.3 39.0 - 52.0 %   MCV 91.4 80.0 - 100.0 fL   MCH 30.3 26.0 - 34.0 pg   MCHC 33.2 30.0 - 36.0 g/dL   RDW 12.0 11.5 - 15.5 %   Platelets 286 150 - 400 K/uL   nRBC 0.0 0.0 - 0.2 %    Comment: Performed at [facility], [place] [address], [place], [place] [address]  Urinalysis,  Routine w reflex microscopic     Status: Abnormal   Collection Time: 09/01/19  5:09 PM  Result Value Ref Range   Color, Urine AMBER (A) YELLOW    Comment: BIOCHEMICALS MAY BE AFFECTED BY COLOR   APPearance HAZY (A) CLEAR   Specific Gravity, Urine 1.019 1.005 - 1.030   pH 6.0 5.0 - 8.0   Glucose, UA NEGATIVE NEGATIVE mg/dL   Hgb urine dipstick LARGE (A) NEGATIVE   Bilirubin Urine NEGATIVE NEGATIVE   Ketones, ur 80 (A) NEGATIVE mg/dL   Protein, ur 30 (A) NEGATIVE mg/dL   Nitrite NEGATIVE NEGATIVE   Leukocytes,Ua NEGATIVE NEGATIVE   RBC / HPF >50 (H) 0 - 5 RBC/hpf   WBC, UA 6-10 0 - 5 WBC/hpf   Bacteria, UA NONE SEEN NONE SEEN   Mucus PRESENT    Hyaline Casts, UA PRESENT     Comment: Performed at [facility], [place] [address], [place], [place] [address]    CT Abdomen Pelvis W Contrast  Result Date: 09/01/2019 CLINICAL DATA:  Nausea vomiting and diarrhea. Nonlocalized intermittent abdominal pain for 1 week. EXAM: CT ABDOMEN AND PELVIS WITH CONTRAST TECHNIQUE: Multidetector CT imaging of the abdomen and pelvis was performed using the standard protocol following bolus administration of intravenous contrast. CONTRAST:  161mL OMNIPAQUE IOHEXOL 300 MG/ML  SOLN COMPARISON:  12/11/2012 FINDINGS: Lower chest: No acute abnormality. Hepatobiliary: Status post cholecystectomy. No suspicious liver abnormality. Mild intrahepatic bile duct dilatation with fusiform dilatation of the CBD measuring up to 1.1 cm. Pancreas: Unremarkable. No pancreatic ductal dilatation or surrounding inflammatory changes. Spleen: Normal in size without focal abnormality. Adrenals/Urinary Tract: Normal adrenal glands. 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 Bilateral kidney stones. The largest stone is in the left upper pole measuring 7 mm. Cortical defect within upper pole of left kidney may also represent changes from partial nephrectomy. Urinary bladder is negative. Stomach/Bowel: Stomach is  nondistended. There is abnormal mid small bowel dilatation measuring up to 5.3 cm compatible with small bowel obstruction. The transition point is in the right lower quadrant of the abdomen, image 61/2. Here, there is a segment of inflamed small bowel with abnormal wall thickening, mucosal enhancement and inflammation compatible with segmental enteritis. Decompressed terminal ileum noted. Colon is also decompressed the. Vascular/Lymphatic: No significant vascular findings are present. No enlarged abdominal or pelvic lymph nodes. Reproductive: Prostate is unremarkable. Other:  Moderate free fluid within the pelvis. No abscess or pneumoperitoneum. Musculoskeletal: No acute or significant osseous findings. IMPRESSION: 1. Examination is positive for small bowel obstruction. Transition point is in the right lower quadrant of the abdomen. 2. Inflamed segment of small bowel identified within the right lower quadrant of the abdomen and access the transition point for the bowel obstruction. Compatible with segmental enteritis. Findings may be inflammatory or infectious in etiology. Inflammatory bowel disease not excluded. 3. Moderate free fluid noted within the pelvis. No abscess or pneumoperitoneum. 4. Status post cholecystectomy with mild intrahepatic bile duct dilatation and fusiform dilatation of the CBD. 5. Bilateral nonobstructing renal calculi. Electronically Signed   By: Kerby Moors M.D.   On: 09/01/2019 17:41    Review of Systems  Gastrointestinal: Positive for abdominal distention, abdominal pain, diarrhea, nausea and vomiting. Negative for blood in stool.  All other systems reviewed and are negative.  Blood pressure (!) 173/91, pulse 78, temperature 98.4 F (36.9 C), temperature source Oral, resp. rate 18, height 6\' 2"  (1.88 m), weight 86.2 kg, SpO2 99 %. Physical Exam  Constitutional: He is oriented to person, place, and time. He appears well-developed and well-nourished.  HENT:  Head: Normocephalic  and atraumatic.  Eyes: Pupils are equal, round, and reactive to light. EOM are normal.  Cardiovascular: Normal rate and regular rhythm.  Respiratory: Effort normal. No respiratory distress.  GI: He exhibits distension. He exhibits no mass. There is no abdominal tenderness. There is no rebound and no guarding.  Musculoskeletal:        General: Normal range of motion.     Cervical back: Normal range of motion and neck supple.  Neurological: He is alert and oriented to person, place, and time.  Skin: Skin is warm and dry.  Psychiatric: He has a normal mood and affect. His behavior is normal. Judgment and thought content normal.    Assessment/Plan: Partial small bowel obstruction-chronic  CT scan reviewed.  He has significant small bowel distention and a transition point in the right lower quadrant at what appears to be a stricture.  Unclear of etiology but he is partially obstructed but I think would benefit from NG tube decompression, IV fluids.  We will place him on the small bowel protocol after placement of NG tube.  I may also benefit from a CT enterography to better define his anatomy.  This could be from adhesions but looks more inflammatory after initial review and he does have fecalization proximal to the area obstruct obstruction.  He has no peritonitis, and actually very little pain with palpation today.   Patient Active Problem List   Diagnosis Date Noted  . SBO (small bowel obstruction) ([place]) 09/01/2019  . Nausea with vomiting 08/27/2019  . Central abdominal pain 08/27/2019  . Diarrhea 08/27/2019  . BPH (benign prostatic hyperplasia) 09/25/2013  . Gilbert's syndrome 09/25/2013  . Family history of arrhythmia 02/10/2013  . Orthostatic hypotension 02/10/2013  . Dizzy 02/10/2013  . Atrial fibrillation ([place]) 02/10/2013  . Normocytic anemia 07/10/2012  . Elevated LFTs 07/10/2012  . S/P laparoscopic cholecystectomy Nove 2013 05/23/2012  . Renal mass, right 05/19/2012   Joyice Faster Yentl Verge 09/01/2019, 6:49 PM

## 2019-09-01 NOTE — Telephone Encounter (Signed)
Patient is calling back to follow up.

## 2019-09-02 ENCOUNTER — Inpatient Hospital Stay (HOSPITAL_COMMUNITY): Payer: Medicare Other

## 2019-09-02 LAB — COMPREHENSIVE METABOLIC PANEL
ALT: 12 U/L (ref 0–44)
AST: 13 U/L — ABNORMAL LOW (ref 15–41)
Albumin: 3.5 g/dL (ref 3.5–5.0)
Alkaline Phosphatase: 65 U/L (ref 38–126)
Anion gap: 10 (ref 5–15)
BUN: 13 mg/dL (ref 8–23)
CO2: 23 mmol/L (ref 22–32)
Calcium: 8.6 mg/dL — ABNORMAL LOW (ref 8.9–10.3)
Chloride: 109 mmol/L (ref 98–111)
Creatinine, Ser: 0.87 mg/dL (ref 0.61–1.24)
GFR calc Af Amer: >60 mL/min (ref >60)
GFR calc non Af Amer: >60 mL/min (ref >60)
Glucose, Bld: 119 mg/dL — ABNORMAL HIGH (ref 70–99)
Potassium: 4 mmol/L (ref 3.5–5.1)
Sodium: 142 mmol/L (ref 135–145)
Total Bilirubin: 2.5 mg/dL — ABNORMAL HIGH (ref 0.3–1.2)
Total Protein: 6.1 g/dL — ABNORMAL LOW (ref 6.5–8.1)

## 2019-09-02 LAB — CBC
HCT: 40 % (ref 39.0–52.0)
Hemoglobin: 13.4 g/dL (ref 13.0–17.0)
MCH: 30.7 pg (ref 26.0–34.0)
MCHC: 33.5 g/dL (ref 30.0–36.0)
MCV: 91.5 fL (ref 80.0–100.0)
Platelets: 284 10*3/uL (ref 150–400)
RBC: 4.37 MIL/uL (ref 4.22–5.81)
RDW: 12.1 % (ref 11.5–15.5)
WBC: 10.1 10*3/uL (ref 4.0–10.5)
nRBC: 0 % (ref 0.0–0.2)

## 2019-09-02 LAB — SARS CORONAVIRUS 2 (TAT 6-24 HRS): SARS Coronavirus 2: NEGATIVE

## 2019-09-02 LAB — HIV ANTIBODY (ROUTINE TESTING W REFLEX): HIV Screen 4th Generation wRfx: NONREACTIVE

## 2019-09-02 MED ORDER — PHENOL 1.4 % MT LIQD
1.0000 | OROMUCOSAL | Status: DC | PRN
  Administered 2019-09-02: 1 via OROMUCOSAL
  Filled 2019-09-02 (×2): qty 177

## 2019-09-02 MED ORDER — DIPHENHYDRAMINE HCL 50 MG/ML IJ SOLN: 12.5000 mg | Freq: Every evening | INTRAMUSCULAR | Status: DC | PRN

## 2019-09-02 MED ORDER — FENTANYL CITRATE (PF) 100 MCG/2ML IJ SOLN
12.5000 ug | Freq: Once | INTRAMUSCULAR | Status: AC
  Administered 2019-09-02: 12.5 ug via INTRAVENOUS
  Filled 2019-09-02: qty 2

## 2019-09-02 MED ORDER — FAMOTIDINE IN NACL 20-0.9 MG/50ML-% IV SOLN
20.0000 mg | Freq: Two times a day (BID) | INTRAVENOUS | Status: DC
  Administered 2019-09-02 – 2019-09-03 (×3): 20 mg via INTRAVENOUS
  Filled 2019-09-02 (×3): qty 50

## 2019-09-02 MED ORDER — DIATRIZOATE MEGLUMINE & SODIUM 66-10 % PO SOLN
90.0000 mL | Freq: Once | ORAL | Status: AC
  Administered 2019-09-02: 90 mL via NASOGASTRIC
  Filled 2019-09-02: qty 90

## 2019-09-02 MED ORDER — ENOXAPARIN SODIUM 40 MG/0.4ML ~~LOC~~ SOLN
40.0000 mg | SUBCUTANEOUS | Status: DC
  Administered 2019-09-02 – 2019-09-03 (×2): 40 mg via SUBCUTANEOUS
  Filled 2019-09-02 (×3): qty 0.4

## 2019-09-02 MED ORDER — MENTHOL 3 MG MT LOZG
1.0000 | LOZENGE | OROMUCOSAL | Status: DC | PRN
  Filled 2019-09-02: qty 9

## 2019-09-02 NOTE — Plan of Care (Signed)
Instructor Attestation:  [place] Instructor, Vicenta Dunning, MSN, RN,  in agreement with the following learner's note:   Pt alert and oriented x 4. He denies pain and discomfort and has been ambulating independently in his room. Pt has not required any PRN meds for pain to date. Pt is NPO and states he is hungry. Due to NG tube migration, pt has experienced intermittent nausea without vomiting; NG tube repositioned and reinforced. Pt had a liquid stool that appeared cloudy and bright orange in color with decreased odor; this stool is distinguished from urinary output as pt has been voiding in urinal. Will continue to monitor patient for ongoing safety and comfort.

## 2019-09-02 NOTE — Progress Notes (Addendum)
[facility] Progress Note     Subjective: CC-  Continues to have abdominal pain and feel bloated. Felt nauseated while NG tube was clamped after gastrograffin administration. No flatus or BM since admission.  ROS: See above, otherwise other systems negative   Objective: Vital signs in last 24 hours: Temp:  [98.4 F (36.9 C)-98.8 F (37.1 C)] 98.8 F (37.1 C) (02/16 0654) Pulse Rate:  [75-92] 78 (02/16 0654) Resp:  [16-20] 18 (02/16 0654) BP: (155-173)/(87-95) 155/87 (02/16 0654) SpO2:  [96 %-99 %] 96 % (02/16 0654) Weight:  [86.2 kg] 86.2 kg (02/15 1436) Last BM Date: 09/01/19  Intake/Output from previous day: 02/15 0701 - 02/16 0700 In: 1549.3 [I.V.:549.3; IV Piggyback:1000] Out: 1050 [Urine:400; Emesis/NG output:650] Intake/Output this shift: No intake/output data recorded.  PE: Gen:  Alert, NAD, pleasant HEENT: EOM's intact, pupils equal and round Card:  RRR Pulm:  CTAB, no W/R/R, rate and effort normal Abd: Soft, distended, hypoactive BS, no HSM, mild diffuse tenderness, no peritonitis Ext:  no BUE/BLE edema, calves soft and nontender Psych: A&Ox4  Skin: no rashes noted, warm and dry  Lab Results:  Recent Labs    09/01/19 1440 09/02/19 0358  WBC 9.6 10.1  HGB 13.7 13.4  HCT 41.3 40.0  PLT 286 284   BMET Recent Labs    09/01/19 1440 09/02/19 0358  NA 140 142  K 4.0 4.0  CL 107 109  CO2 23 23  GLUCOSE 105* 119*  BUN 13 13  CREATININE 0.94 0.87  CALCIUM 8.5* 8.6*   PT/INR No results for input(s): LABPROT, INR in the last 72 hours. CMP     Component Value Date/Time   NA 142 09/02/2019 0358   NA 141 09/02/2014 1152   K 4.0 09/02/2019 0358   CL 109 09/02/2019 0358   CO2 23 09/02/2019 0358   GLUCOSE 119 (H) 09/02/2019 0358   BUN 13 09/02/2019 0358   BUN 17 09/02/2014 1152   CREATININE 0.87 09/02/2019 0358   CALCIUM 8.6 (L) 09/02/2019 0358   PROT 6.1 (L) 09/02/2019 0358   PROT 6.5 09/02/2014 1152   ALBUMIN 3.5 09/02/2019 0358    ALBUMIN 4.1 09/02/2014 1152   AST 13 (L) 09/02/2019 0358   ALT 12 09/02/2019 0358   ALKPHOS 65 09/02/2019 0358   BILITOT 2.5 (H) 09/02/2019 0358   BILITOT 1.9 (H) 09/02/2014 1152   GFRNONAA >60 09/02/2019 0358   GFRAA >60 09/02/2019 0358   Lipase     Component Value Date/Time   LIPASE 25 09/01/2019 1440       Studies/Results: CT Abdomen Pelvis W Contrast  Result Date: 09/01/2019 CLINICAL DATA:  Nausea vomiting and diarrhea. Nonlocalized intermittent abdominal pain for 1 week. EXAM: CT ABDOMEN AND PELVIS WITH CONTRAST TECHNIQUE: Multidetector CT imaging of the abdomen and pelvis was performed using the standard protocol following bolus administration of intravenous contrast. CONTRAST:  177mL OMNIPAQUE IOHEXOL 300 MG/ML  SOLN COMPARISON:  12/11/2012 FINDINGS: Lower chest: No acute abnormality. Hepatobiliary: Status post cholecystectomy. No suspicious liver abnormality. Mild intrahepatic bile duct dilatation with fusiform dilatation of the CBD measuring up to 1.1 cm. Pancreas: Unremarkable. No pancreatic ductal dilatation or surrounding inflammatory changes. Spleen: Normal in size without focal abnormality. Adrenals/Urinary Tract: Normal adrenal glands. 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 Bilateral kidney stones. The largest stone is in the left upper pole measuring 7 mm. Cortical defect within upper pole of left kidney may also represent changes from partial nephrectomy. Urinary bladder is negative. Stomach/Bowel: Stomach  is nondistended. There is abnormal mid small bowel dilatation measuring up to 5.3 cm compatible with small bowel obstruction. The transition point is in the right lower quadrant of the abdomen, image 61/2. Here, there is a segment of inflamed small bowel with abnormal wall thickening, mucosal enhancement and inflammation compatible with segmental enteritis. Decompressed terminal ileum noted. Colon is also decompressed the. Vascular/Lymphatic: No significant  vascular findings are present. No enlarged abdominal or pelvic lymph nodes. Reproductive: Prostate is unremarkable. Other: Moderate free fluid within the pelvis. No abscess or pneumoperitoneum. Musculoskeletal: No acute or significant osseous findings. IMPRESSION: 1. Examination is positive for small bowel obstruction. Transition point is in the right lower quadrant of the abdomen. 2. Inflamed segment of small bowel identified within the right lower quadrant of the abdomen and access the transition point for the bowel obstruction. Compatible with segmental enteritis. Findings may be inflammatory or infectious in etiology. Inflammatory bowel disease not excluded. 3. Moderate free fluid noted within the pelvis. No abscess or pneumoperitoneum. 4. Status post cholecystectomy with mild intrahepatic bile duct dilatation and fusiform dilatation of the CBD. 5. Bilateral nonobstructing renal calculi. Electronically Signed   By: Kerby Moors M.D.   On: 09/01/2019 17:41    Anti-infectives: Anti-infectives (From admission, onward)   None       Assessment/Plan BPH Hyperplastic colon polyps Gilbert's syndrome GERD H/o cholecystectomy H/o partial nephrectomy for benign renal mass  Partial SBO - CT scan shows SBO with transition point in the RLQ, possibly due to stricture - last colonoscopy 2010 normal, due for another one  ID - none FEN - IVF, NPO/NGT to LIWS VTE - SCDs, lovenox Foley - none Follow up - TBD  Plan - Will start small bowel protocol. Continue NPO/NGT to LIWS. Ok to clamp NG tube briefly to allow patient to ambulate. Labs in AM. I called and updated the patient's daughter.   LOS: 1 day    [place], [facility] 09/02/2019, 11:38 AM Please see Amion for pager number during day hours 7:00am-4:30pm

## 2019-09-02 NOTE — Progress Notes (Signed)
PROGRESS NOTE    Kevin Doyle  MRN:[id number] DOB: 01/22/1951 DOA: 09/01/2019 PCP: Patient, No Pcp Per   Brief Narrative: 69-year-old male with history of remote atrial fibrillation, BPH, Gilbert's syndrome, hyperplastic colon polyps and GERD, history of cholecystectomy secondary to cholelithiasis 05/2012 LOA laparoscopically and robotically assisted partial nephrectomy secondary to benign right renal mass in 06/2012 presented with persistent abdominal pain x1 wk PTA initially seen and lumbar GI clinic 2/10 celiac panel labs were done and negative GI pathogen panel was still pending there was plan for CT abdomen pelvis if symptoms worsen and colonoscopy in a few week.  3 weeks ago had lower back pain after moving heavy objects. Seen in the ED labs with elevated bilirubin CT abdomen showed a small bowel obstruction with transition point in the right lower abdomen, segmental enteritis of the small bowel and bilateral nonobstructive renal calculi noted .  Surgery was consulted and patient was admitted  Subjective: Feels Less bloated, less painful, no flatus. NGT clamped  Assessment & Plan:  SBO: Status post NG tube decompression, continue IV fluids and p.o. pain control.  Continue further plan as per general surgery.  Add Pepcid IV.  Complains of throat irritation from NG tube continue Chloraseptic spray  Atrial fibrillation remote history currently normal sinus rhythm.  Not on anticoagulation.    Gilbert's syndrome, with elevated total bilirubin chronic.  Monitor  Renal calculi, bilateral, nonobstructive seen in the CT scan will need outpatient follow-up  Body mass index is 24.39 kg/m.   DVT prophylaxis: heparin  sq Code Status:full Family Communication: plan of care discussed with patient at bedside. Disposition Plan: Patient is from: home Anticipated Disposition: to home, in 2-3 days Barriers to discharge or conditions that needs to be met prior to discharge: Once bowel obstruction  resolves and signed off by surgery.  Consultants: General surgery. Procedures: NGT Microbiology:see note  Medications: Scheduled Meds: . ondansetron (ZOFRAN) IV  4 mg Intravenous Once   Continuous Infusions: . sodium chloride 75 mL/hr at 09/01/19 2039    Antimicrobials: Anti-infectives (From admission, onward)   None       Objective: Vitals: Today's Vitals   09/01/19 2042 09/02/19 0153 09/02/19 0654 09/02/19 0852  BP:   (!) 155/87   Pulse:   78   Resp:   18   Temp:   98.8 F (37.1 C)   TempSrc:   Oral   SpO2:   96%   Weight:      Height:      PainSc: 3  5   3     Intake/Output Summary (Last 24 hours) at 09/02/2019 0914 Last data filed at 09/02/2019 0846 Gross per 24 hour  Intake 1549.28 ml  Output 1050 ml  Net 499.28 ml   Filed Weights   09/01/19 1436  Weight: 86.2 kg   Weight change:    Intake/Output from previous day: 02/15 0701 - 02/16 0700 In: 1549.3 [I.V.:549.3; IV Piggyback:1000] Out: 1050 [Urine:400; Emesis/NG output:650] Intake/Output this shift: No intake/output data recorded.  Examination:  General exam: AAOx3 ,NAD, weak appearing. HEENT:Oral mucosa moist, Ear/Nose WNL grossly,dentition normal. Respiratory system: bilaterally clear,no wheezing or crackles,no use of accessory muscle, non tender. Cardiovascular system: S1 & S2 +, regular, No JVD. Gastrointestinal system: Abdomen soft, mildly distended, NG tube present, NT, BS SLUGGISH Nervous System:Alert, awake, moving extremities and grossly nonfocal Extremities: No edema, distal peripheral pulses palpable.  Skin: No rashes,no icterus. MSK: Normal muscle bulk,tone, power  Data Reviewed: I have personally reviewed following labs   PROGRESS NOTE    Kevin Doyle  WUJ:[id number] DOB: [birth year] DOA: 09/01/2019 PCP: Patient, No Pcp Per   Brief Narrative: 69 year old male with history of remote atrial fibrillation, BPH, Gilbert's syndrome, hyperplastic colon polyps and GERD, history of cholecystectomy secondary to cholelithiasis 05/2012 LOA laparoscopically and robotically assisted partial nephrectomy secondary to benign right renal mass in 06/2012 presented with persistent abdominal pain x1 wk PTA initially seen and lumbar GI clinic 2/10 celiac panel labs were done and negative GI pathogen panel was still pending there was plan for CT abdomen pelvis if symptoms worsen and colonoscopy in a few week.  3 weeks ago had lower back pain after moving heavy objects. Seen in the ED labs with elevated bilirubin CT abdomen showed a small bowel obstruction with transition point in the right lower abdomen, segmental enteritis of the small bowel and bilateral nonobstructive renal calculi noted .  Surgery was consulted and patient was admitted  Subjective: Feels Less bloated, less painful, no flatus. NGT clamped  Assessment & Plan:  SBO: Status post NG tube decompression, continue IV fluids and p.o. pain control.  Continue further plan as per general surgery.  Add Pepcid IV.  Complains of throat irritation from NG tube continue Chloraseptic spray  Atrial fibrillation remote history currently normal sinus rhythm.  Not on anticoagulation.    Gilbert's syndrome, with elevated total bilirubin chronic.  Monitor  Renal calculi, bilateral, nonobstructive seen in the CT scan will need outpatient follow-up  Body mass index is 24.39 kg/m.   DVT prophylaxis: heparin  sq Code Status:full Family Communication: plan of care discussed with patient at bedside. Disposition Plan: Patient is from: home Anticipated Disposition: to home, in 2-3 days Barriers to discharge or conditions that needs to be met prior to discharge: Once bowel obstruction  resolves and signed off by surgery.  Consultants: General surgery. Procedures: NGT Microbiology:see note  Medications: Scheduled Meds: . ondansetron (ZOFRAN) IV  4 mg Intravenous Once   Continuous Infusions: . sodium chloride 75 mL/hr at 09/01/19 2039    Antimicrobials: Anti-infectives (From admission, onward)   None       Objective: Vitals: Today's Vitals   09/01/19 2042 09/02/19 0153 09/02/19 0654 09/02/19 0852  BP:   (!) 155/87   Pulse:   78   Resp:   18   Temp:   98.8 F (37.1 C)   TempSrc:   Oral   SpO2:   96%   Weight:      Height:      PainSc: _0 Intake/Output Summary (Last 24 hours) at 09/02/2019 0914 Last data filed at 09/02/2019 0846 Gross per 24 hour  Intake 1549.28 ml  Output 1050 ml  Net 499.28 ml   Filed Weights   09/01/19 1436  Weight: 86.2 kg   Weight change:    Intake/Output from previous day: 02/15 0701 - 02/16 0700 In: 1549.3 [I.V.:549.3; IV Piggyback:1000] Out: 1050 [Urine:400; Emesis/NG output:650] Intake/Output this shift: No intake/output data recorded.  Examination:  General exam: AAOx3 ,NAD, weak appearing. HEENT:Oral mucosa moist, Ear/Nose WNL grossly,dentition normal. Respiratory system: bilaterally clear,no wheezing or crackles,no use of accessory muscle, non tender. Cardiovascular system: S1 & S2 +, regular, No JVD. Gastrointestinal system: Abdomen soft, mildly distended, NG tube present, NT, BS SLUGGISH Nervous System:Alert, awake, moving extremities and grossly nonfocal Extremities: No edema, distal peripheral pulses palpable.  Skin: No rashes,no icterus. MSK: Normal muscle bulk,tone, power  Data Reviewed: I have personally reviewed following labs  PROGRESS NOTE    Kevin Doyle  MRN:[id number] DOB: 01/22/1951 DOA: 09/01/2019 PCP: Patient, No Pcp Per   Brief Narrative: 69-year-old male with history of remote atrial fibrillation, BPH, Gilbert's syndrome, hyperplastic colon polyps and GERD, history of cholecystectomy secondary to cholelithiasis 05/2012 LOA laparoscopically and robotically assisted partial nephrectomy secondary to benign right renal mass in 06/2012 presented with persistent abdominal pain x1 wk PTA initially seen and lumbar GI clinic 2/10 celiac panel labs were done and negative GI pathogen panel was still pending there was plan for CT abdomen pelvis if symptoms worsen and colonoscopy in a few week.  3 weeks ago had lower back pain after moving heavy objects. Seen in the ED labs with elevated bilirubin CT abdomen showed a small bowel obstruction with transition point in the right lower abdomen, segmental enteritis of the small bowel and bilateral nonobstructive renal calculi noted .  Surgery was consulted and patient was admitted  Subjective: Feels Less bloated, less painful, no flatus. NGT clamped  Assessment & Plan:  SBO: Status post NG tube decompression, continue IV fluids and p.o. pain control.  Continue further plan as per general surgery.  Add Pepcid IV.  Complains of throat irritation from NG tube continue Chloraseptic spray  Atrial fibrillation remote history currently normal sinus rhythm.  Not on anticoagulation.    Gilbert's syndrome, with elevated total bilirubin chronic.  Monitor  Renal calculi, bilateral, nonobstructive seen in the CT scan will need outpatient follow-up  Body mass index is 24.39 kg/m.   DVT prophylaxis: heparin  sq Code Status:full Family Communication: plan of care discussed with patient at bedside. Disposition Plan: Patient is from: home Anticipated Disposition: to home, in 2-3 days Barriers to discharge or conditions that needs to be met prior to discharge: Once bowel obstruction  resolves and signed off by surgery.  Consultants: General surgery. Procedures: NGT Microbiology:see note  Medications: Scheduled Meds: . ondansetron (ZOFRAN) IV  4 mg Intravenous Once   Continuous Infusions: . sodium chloride 75 mL/hr at 09/01/19 2039    Antimicrobials: Anti-infectives (From admission, onward)   None       Objective: Vitals: Today's Vitals   09/01/19 2042 09/02/19 0153 09/02/19 0654 09/02/19 0852  BP:   (!) 155/87   Pulse:   78   Resp:   18   Temp:   98.8 F (37.1 C)   TempSrc:   Oral   SpO2:   96%   Weight:      Height:      PainSc: 3  5   3     Intake/Output Summary (Last 24 hours) at 09/02/2019 0914 Last data filed at 09/02/2019 0846 Gross per 24 hour  Intake 1549.28 ml  Output 1050 ml  Net 499.28 ml   Filed Weights   09/01/19 1436  Weight: 86.2 kg   Weight change:    Intake/Output from previous day: 02/15 0701 - 02/16 0700 In: 1549.3 [I.V.:549.3; IV Piggyback:1000] Out: 1050 [Urine:400; Emesis/NG output:650] Intake/Output this shift: No intake/output data recorded.  Examination:  General exam: AAOx3 ,NAD, weak appearing. HEENT:Oral mucosa moist, Ear/Nose WNL grossly,dentition normal. Respiratory system: bilaterally clear,no wheezing or crackles,no use of accessory muscle, non tender. Cardiovascular system: S1 & S2 +, regular, No JVD. Gastrointestinal system: Abdomen soft, mildly distended, NG tube present, NT, BS SLUGGISH Nervous System:Alert, awake, moving extremities and grossly nonfocal Extremities: No edema, distal peripheral pulses palpable.  Skin: No rashes,no icterus. MSK: Normal muscle bulk,tone, power  Data Reviewed: I have personally reviewed following labs

## 2019-09-02 NOTE — Plan of Care (Signed)
Pt alert and oriented x 4. He denies pain and discomfort and has been ambulating independently in his room. Pt has not required any PRN meds for pain to date. Pt is NPO and states he is hungry. Due to NG tube migration, pt has experienced intermittent nausea without vomiting; NG tube repositioned and reinforced. Pt had a liquid stool that appeared cloudy and bright orange in color with decreased odor; this stool is distinguished from urinary output as pt has been voiding in urinal. Will continue to monitor patient for ongoing safety and comfort.

## 2019-09-03 ENCOUNTER — Inpatient Hospital Stay (HOSPITAL_COMMUNITY): Payer: Medicare Other

## 2019-09-03 LAB — BASIC METABOLIC PANEL
Anion gap: 6 (ref 5–15)
BUN: 20 mg/dL (ref 8–23)
CO2: 24 mmol/L (ref 22–32)
Calcium: 8.2 mg/dL — ABNORMAL LOW (ref 8.9–10.3)
Chloride: 112 mmol/L — ABNORMAL HIGH (ref 98–111)
Creatinine, Ser: 0.81 mg/dL (ref 0.61–1.24)
GFR calc Af Amer: >60 mL/min (ref >60)
GFR calc non Af Amer: >60 mL/min (ref >60)
Glucose, Bld: 99 mg/dL (ref 70–99)
Potassium: 3.7 mmol/L (ref 3.5–5.1)
Sodium: 142 mmol/L (ref 135–145)

## 2019-09-03 LAB — PHOSPHORUS: Phosphorus: 2.8 mg/dL (ref 2.5–4.6)

## 2019-09-03 LAB — CBC
HCT: 33.8 % — ABNORMAL LOW (ref 39.0–52.0)
Hemoglobin: 11.3 g/dL — ABNORMAL LOW (ref 13.0–17.0)
MCH: 31.2 pg (ref 26.0–34.0)
MCHC: 33.4 g/dL (ref 30.0–36.0)
MCV: 93.4 fL (ref 80.0–100.0)
Platelets: 204 10*3/uL (ref 150–400)
RBC: 3.62 MIL/uL — ABNORMAL LOW (ref 4.22–5.81)
RDW: 12.3 % (ref 11.5–15.5)
WBC: 5.3 10*3/uL (ref 4.0–10.5)
nRBC: 0 % (ref 0.0–0.2)

## 2019-09-03 LAB — MAGNESIUM: Magnesium: 2 mg/dL (ref 1.7–2.4)

## 2019-09-03 NOTE — Discharge Instructions (Signed)
Bowel Obstruction A bowel obstruction is a blockage in the small or large bowel. The bowel, which is also called the intestine, is a long, slender tube that connects the stomach to the anus. When a person eats and drinks, food and fluids go from the mouth to the stomach to the small bowel. This is where most of the nutrients in the food and fluids are absorbed. After the small bowel, material passes through the large bowel for further absorption until any leftover material leaves the body as stool through the anus during a bowel movement. A bowel obstruction will prevent food and fluids from passing through the bowel as they normally do during digestion. The bowel can become partially or completely blocked. If this condition is not treated, it can be dangerous because the bowel could rupture. What are the causes? Common causes of this condition include:  Scar tissue (adhesions) from previous surgery or treatment with high-energy X-rays (radiation).  Recent surgery. This may cause the movements of the bowel to slow down and cause food to block the intestine.  Inflammatory bowel disease, such as Crohn's disease or diverticulitis.  Growths or tumors.  A bulging organ (hernia).  Twisting of the bowel (volvulus).  A foreign body.  Slipping of a part of the bowel into another part (intussusception). What are the signs or symptoms? Symptoms of this condition include:  Pain in the abdomen. Depending on the degree of obstruction, pain may be: ? Mild or severe. ? Dull cramping or sharp pain. ? In one area or in the entire abdomen.  Nausea and vomiting. Vomit may be greenish or a yellow bile color.  Bloating in the abdomen.  Difficulty passing stool (constipation).  Lack of passing gas.  Frequent belching.  Diarrhea. This may occur if the obstruction is partial and runny stool is able to leak around the obstruction. How is this diagnosed? This condition may be diagnosed based  on:  A physical exam.  Medical history.  Imaging tests of the abdomen or pelvis, such as X-ray or CT scan.  Blood or urine tests. How is this treated? Treatment for this condition depends on the cause and severity of the problem. Treatment may include:  Fluids and pain medicines that are given through an IV. Your health care provider may instruct you not to eat or drink if you have nausea or vomiting.  Eating a simple diet. You may be asked to consume a clear liquid diet for several days. This allows the bowel to rest.  Placement of a small tube (nasogastric tube) into the stomach. This will relieve pain, discomfort, and nausea by removing blocked air and fluids from the stomach. It can also help the obstruction clear up faster.  Surgery. This may be required if other treatments do not work. Surgery may be required for: ? Bowel obstruction from a hernia. This can be an emergency procedure. ? Scar tissue that causes frequent or severe obstructions. Follow these instructions at home: Medicines  Take over-the-counter and prescription medicines only as told by your health care provider.  If you were prescribed an antibiotic medicine, take it as told by your health care provider. Do not stop taking the antibiotic even if you start to feel better. General instructions  Follow instructions from your health care provider about eating restrictions. You may need to avoid solid foods and consume only clear liquids until your condition improves.  Return to your normal activities as told by your health care provider. Ask your health  care provider what activities are safe for you.  Avoid sitting for a long time without moving. Get up to take short walks every 1-2 hours. This is important to improve blood flow and breathing. Ask for help if you feel weak or unsteady.  Keep all follow-up visits as told by your health care provider. This is important. How is this prevented? After having a bowel  obstruction, you are more likely to have another. You may do the following things to prevent another obstruction:  If you have a long-term (chronic) disease, pay attention to your symptoms and contact your health care provider if you have questions or concerns.  Avoid becoming constipated. To prevent or treat constipation, your health care provider may recommend that you: ? Drink enough fluid to keep your urine pale yellow. ? Take over-the-counter or prescription medicines. ? Eat foods that are high in fiber, such as beans, whole grains, and fresh fruits and vegetables. ? Limit foods that are high in fat and processed sugars, such as fried or sweet foods.  Stay active. Exercise for 30 minutes or more, 5 or more days each week. Ask your health care provider which exercises are safe for you.  Avoid stress. Find ways to reduce stress, such as meditation, exercise, or taking time for activities that relax you.  Instead of eating three large meals each day, eat three small meals with three small snacks.  Work with a Microbiologist to make a healthy meal plan that works for you.  Do not use any products that contain nicotine or tobacco, such as cigarettes and e-cigarettes. If you need help quitting, ask your health care provider. Contact a health care provider if you:  Have a fever.  Have chills. Get help right away if you:  Have increased pain or cramping.  Vomit blood.  Have uncontrolled vomiting or nausea.  Cannot drink fluids because of vomiting or pain.  Become confused.  Begin feeling very thirsty (dehydrated).  Have severe bloating.  Feel extremely weak or you faint. Summary  A bowel obstruction is a blockage in the small or large bowel.  A bowel obstruction will prevent food and fluids from passing through the bowel as they normally do during digestion.  Treatment for this condition depends on the cause and severity of the problem. It may include fluids and pain medicines  through an IV, a simple diet, a nasogastric tube, or surgery.  Follow instructions from your health care provider about eating restrictions. You may need to avoid solid foods and consume only clear liquids until your condition improves. This information is not intended to replace advice given to you by your health care provider. Make sure you discuss any questions you have with your health care provider. Document Revised: 08/09/2018 Document Reviewed: 11/14/2017 Elsevier Patient Education  [place] A soft-food eating plan includes foods that are safe and easy to chew and swallow. Your health care provider or dietitian can help you find foods and flavors that fit into this plan. Follow this plan until your health care provider or dietitian says it is safe to start eating other foods and food textures. What are tips for following this plan? General guidelines   Take small bites of food, or cut food into pieces about  inch or smaller. Bite-sized pieces of food are easier to chew and swallow.  Eat moist foods. Avoid overly dry foods.  Avoid foods that: ? Are difficult to swallow, such as dry, chunky,  crispy, or sticky foods. ? Are difficult to chew, such as hard, tough, or stringy foods. ? Contain nuts, seeds, or fruits.  Follow instructions from your dietitian about the types of liquids that are safe for you to swallow. You may be allowed to have: ? Thick liquids only. This includes only liquids that are thicker than honey. ? Thin and thick liquids. This includes all beverages and foods that become liquid at room temperature.  To make thick liquids: ? Purchase a commercial liquid thickening powder. These are available at grocery stores and pharmacies. ? Mix the thickener into liquids according to instructions on the label. ? Purchase ready-made thickened liquids. ? Thicken soup by pureeing, straining to remove chunks, and adding flour, potato flakes, or  corn starch. ? Add commercial thickener to foods that become liquid at room temperature, such as milk shakes, yogurt, ice cream, gelatin, and sherbet.  Ask your health care provider whether you need to take a fiber supplement. Cooking  Cook meats so they stay tender and moist. Use methods like braising, stewing, or baking in liquid.  Cook vegetables and fruit until they are soft enough to be mashed with a fork.  Peel soft, fresh fruits such as peaches, nectarines, and melons.  When making soup, make sure chunks of meat and vegetables are smaller than  inch.  Reheat leftover foods slowly so that a tough crust does not form. What foods are allowed? The items listed below may not be a complete list. Talk with your dietitian about what dietary choices are best for you. Grains Breads, muffins, pancakes, or waffles moistened with syrup, jelly, or butter. Dry cereals well-moistened with milk. Moist, cooked cereals. Well-cooked pasta and rice. Vegetables All soft-cooked vegetables. Shredded lettuce. Fruits All canned and cooked fruits. Soft, peeled fresh fruits. Strawberries. Dairy Milk. Cream. Yogurt. Cottage cheese. Soft cheese without the rind. Meats and other protein foods Tender, moist ground meat, poultry, or fish. Meat cooked in gravy or sauces. Eggs. Sweets and desserts Ice cream. Milk shakes. Sherbet. Pudding. Fats and oils Butter. Margarine. Olive, canola, sunflower, and grapeseed oil. Smooth salad dressing. Smooth cream cheese. Mayonnaise. Gravy. What foods are not allowed? The items listed bemay not be a complete list. Talk with your dietitian about what dietary choices are best for you. Grains Coarse or dry cereals, such as bran, granola, and shredded wheat. Tough or chewy crusty breads, such as [place] bread or baguettes. Breads with nuts, seeds, or fruit. Vegetables All raw vegetables. Cooked corn. Cooked vegetables that are tough or stringy. Tough, crisp, fried potatoes  and potato skins. Fruits Fresh fruits with skins or seeds, or both, such as apples, pears, and grapes. Stringy, high-pulp fruits, such as papaya, pineapple, coconut, and mango. Fruit leather and all dried fruit. Dairy Yogurt with nuts or coconut. Meats and other protein foods Hard, dry sausages. Dry meat, poultry, or fish. Meats with gristle. Fish with bones. Fried meat or fish. Lunch meat and hotdogs. Nuts and seeds. Chunky peanut butter or other nut butters. Sweets and desserts Cakes or cookies that are very dry or chewy. Desserts with dried fruit, nuts, or coconut. Fried pastries. Very rich pastries. Fats and oils Cream cheese with fruit or nuts. Salad dressings with seeds or chunks. Summary  A soft-food eating plan includes foods that are safe and easy to swallow. Generally, the foods should be soft enough to be mashed with a fork.  Avoid foods that are dry, hard to chew, crunchy, sticky, stringy, or crispy.  Ask  your health care provider whether you need to thicken your liquids and if you need to take a fiber supplement. This information is not intended to replace advice given to you by your health care provider. Make sure you discuss any questions you have with your health care provider. Document Revised: 10/24/2018 Document Reviewed: 09/05/2016 Elsevier Patient Education  [place].

## 2019-09-03 NOTE — Progress Notes (Signed)
Discharge instructions given to pt and all questions were answered.  

## 2019-09-03 NOTE — Progress Notes (Signed)
PROGRESS NOTE    Kevin Doyle  GGY:[id number] DOB: 10/07/1950 DOA: 09/01/2019 PCP: Patient, No Pcp Per   Brief Narrative: 69 year old male with history of remote atrial fibrillation, BPH, Gilbert's syndrome, hyperplastic colon polyps and GERD, history of cholecystectomy secondary to cholelithiasis 05/2012 LOA laparoscopically and robotically assisted partial nephrectomy secondary to benign right renal mass in 06/2012 presented with persistent abdominal pain x1 wk PTA initially seen and lumbar GI clinic 2/10 celiac panel labs were done and negative GI pathogen panel was still pending there was plan for CT abdomen pelvis if symptoms worsen and colonoscopy in a few week.  3 weeks ago had lower back pain after moving heavy objects. Seen in the ED labs with elevated bilirubin CT abdomen showed a small bowel obstruction with transition point in the right lower abdomen, segmental enteritis of the small bowel and bilateral nonobstructive renal calculi noted .  Surgery was consulted and patient was admitted  Subjective: Had a bowel movement and no nausea vomiting abdomen feels less distended and softer. NG tube is being discontinued this morning. Assessment & Plan:  SBO: Status post NG tube decompression-resolving, appreciate surgery input and noted plan for NGT removal today, allow clear liquid diet advance to full liquid diet as tolerated.  Continue IV fluids until he is able to take orally well.Cont Pepcid IV.   Atrial fibrillation remote history currently normal sinus rhythm.  Not on anticoagulation.    Gilbert's syndrome, with elevated total bilirubin chronic.  Monitor  Renal calculi, bilateral, nonobstructive seen in the CT scan will need outpatient follow-up  Body mass index is 24.39 kg/m.   DVT prophylaxis: heparin  sq Code Status:full Family Communication: plan of care discussed with patient at bedside. Disposition Plan: Patient is from: home Anticipated Disposition: to home, in 1-2  days Barriers to discharge or conditions that needs to be met prior to discharge: Once tolerating diet and signed out by surgery   Consultants: General surgery. Procedures: NGT Microbiology:see note  Medications: Scheduled Meds: . enoxaparin (LOVENOX) injection  40 mg Subcutaneous Q24H  . ondansetron (ZOFRAN) IV  4 mg Intravenous Once   Continuous Infusions: . sodium chloride 75 mL/hr at 09/02/19 1820  . famotidine (PEPCID) IV 20 mg (09/02/19 2200)    Antimicrobials: Anti-infectives (From admission, onward)   None       Objective: Vitals: Today's Vitals   09/02/19 2135 09/03/19 0507 09/03/19 0835 09/03/19 0858  BP: (!) 145/70 131/70    Pulse: 75 (!) 59    Resp: 16 14    Temp: 98.7 F (37.1 C) 98 F (36.7 C)    TempSrc: Oral Oral    SpO2: 99% 100%    Weight:      Height:      PainSc:   0-No pain 0-No pain    Intake/Output Summary (Last 24 hours) at 09/03/2019 0949 Last data filed at 09/03/2019 0507 Gross per 24 hour  Intake 2508.29 ml  Output 650 ml  Net 1858.29 ml   Filed Weights   09/01/19 1436  Weight: 86.2 kg   Weight change:    Intake/Output from previous day: 02/16 0701 - 02/17 0700 In: 2508.3 [I.V.:2410.8; IV Piggyback:97.5] Out: 650 [Emesis/NG output:650] Intake/Output this shift: No intake/output data recorded.  Examination:  General exam: Oriented x3, not in acute distress.Marland Kitchen HEENT:Oral mucosa moist, Ear/Nose WNL grossly,dentition normal. Respiratory system: bilaterally clear without crackles or wheezing, non tender. Cardiovascular system: S1 & S2 +, regular, No JVD. Gastrointestinal system: Abdomen soft, tender nondistended, bowel sounds present.  Nervous System:Alert, awake, moving extremities and grossly nonfocal Extremities: No edema, distal peripheral pulses palpable.  Skin: No rashes,no icterus. MSK: Normal muscle bulk,tone, power  Data Reviewed: I have personally reviewed following labs and imaging studies CBC: Recent Labs  Lab  08/27/19 1023 09/01/19 1440 09/02/19 0358 09/03/19 0354  WBC 7.8 9.6 10.1 5.3  NEUTROABS 5.5  --   --   --   HGB 13.5 13.7 13.4 11.3*  HCT 39.0 41.3 40.0 33.8*  MCV 89.1 91.4 91.5 93.4  PLT 331.0 286 284 097   Basic Metabolic Panel: Recent Labs  Lab 08/27/19 1023 09/01/19 1440 09/02/19 0358 09/03/19 0354  NA 138 140 142 142  K 4.0 4.0 4.0 3.7  CL 104 107 109 112*  CO2 '28 23 23 24  '$ GLUCOSE 99 105* 119* 99  BUN '19 13 13 20  '$ CREATININE 1.05 0.94 0.87 0.81  CALCIUM 9.0 8.5* 8.6* 8.2*  MG  --   --   --  2.0  PHOS  --   --   --  2.8   GFR: Estimated Creatinine Clearance: 101.5 mL/min (by C-G formula based on SCr of 0.81 mg/dL). Liver Function Tests: Recent Labs  Lab 08/27/19 1023 09/01/19 1440 09/02/19 0358  AST 13 14* 13*  ALT '12 13 12  '$ ALKPHOS 93 70 65  BILITOT 2.1* 1.9* 2.5*  PROT 6.7 6.4* 6.1*  ALBUMIN 3.9 3.6 3.5   Recent Labs  Lab 09/01/19 1440  LIPASE 25   No results for input(s): AMMONIA in the last 168 hours. Coagulation Profile: No results for input(s): INR, PROTIME in the last 168 hours. Cardiac Enzymes: No results for input(s): CKTOTAL, CKMB, CKMBINDEX, TROPONINI in the last 168 hours. BNP (last 3 results) No results for input(s): PROBNP in the last 8760 hours. HbA1C: No results for input(s): HGBA1C in the last 72 hours. CBG: No results for input(s): GLUCAP in the last 168 hours. Lipid Profile: No results for input(s): CHOL, HDL, LDLCALC, TRIG, CHOLHDL, LDLDIRECT in the last 72 hours. Thyroid Function Tests: No results for input(s): TSH, T4TOTAL, FREET4, T3FREE, THYROIDAB in the last 72 hours. Anemia Panel: No results for input(s): VITAMINB12, FOLATE, FERRITIN, TIBC, IRON, RETICCTPCT in the last 72 hours. Sepsis Labs: No results for input(s): PROCALCITON, LATICACIDVEN in the last 168 hours.  Recent Results (from the past 240 hour(s))  SARS CORONAVIRUS 2 (TAT 6-24 HRS) Nasopharyngeal Nasopharyngeal Swab     Status: None   Collection Time:  09/01/19  7:15 PM   Specimen: Nasopharyngeal Swab  Result Value Ref Range Status   SARS Coronavirus 2 NEGATIVE NEGATIVE Final    Comment: (NOTE) SARS-CoV-2 target nucleic acids are NOT DETECTED. The SARS-CoV-2 RNA is generally detectable in upper and lower respiratory specimens during the acute phase of infection. Negative results do not preclude SARS-CoV-2 infection, do not rule out co-infections with other pathogens, and should not be used as the sole basis for treatment or other patient management decisions. Negative results must be combined with clinical observations, patient history, and epidemiological information. The expected result is Negative. Fact Sheet for Patients: SugarRoll.be Fact Sheet for Healthcare Providers: https://www.woods-mathews.com/ This test is not yet approved or cleared by the [place] FDA and  has been authorized for detection and/or diagnosis of SARS-CoV-2 by FDA under an Emergency Use Authorization (EUA). This EUA will remain  in effect (meaning this test can be used) for the duration of the COVID-19 declaration under Section 56 4(b)(1) of the Act, 21 U.S.C. section 360bbb-3(b)(1), unless the  authorization is terminated or revoked sooner. Performed at [facility], [place] [address], [place], [place] [address]       Radiology Studies: CT Abdomen Pelvis W Contrast  Result Date: 09/01/2019 CLINICAL DATA:  Nausea vomiting and diarrhea. Nonlocalized intermittent abdominal pain for 1 week. EXAM: CT ABDOMEN AND PELVIS WITH CONTRAST TECHNIQUE: Multidetector CT imaging of the abdomen and pelvis was performed using the standard protocol following bolus administration of intravenous contrast. CONTRAST:  142m OMNIPAQUE IOHEXOL 300 MG/ML  SOLN COMPARISON:  12/11/2012 FINDINGS: Lower chest: No acute abnormality. Hepatobiliary: Status post cholecystectomy. No suspicious liver abnormality. Mild intrahepatic bile duct  dilatation with fusiform dilatation of the CBD measuring up to 1.1 cm. Pancreas: Unremarkable. No pancreatic ductal dilatation or surrounding inflammatory changes. Spleen: Normal in size without focal abnormality. Adrenals/Urinary Tract: Normal adrenal glands. 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 Bilateral kidney stones. The largest stone is in the left upper pole measuring 7 mm. Cortical defect within upper pole of left kidney may also represent changes from partial nephrectomy. Urinary bladder is negative. Stomach/Bowel: Stomach is nondistended. There is abnormal mid small bowel dilatation measuring up to 5.3 cm compatible with small bowel obstruction. The transition point is in the right lower quadrant of the abdomen, image 61/2. Here, there is a segment of inflamed small bowel with abnormal wall thickening, mucosal enhancement and inflammation compatible with segmental enteritis. Decompressed terminal ileum noted. Colon is also decompressed the. Vascular/Lymphatic: No significant vascular findings are present. No enlarged abdominal or pelvic lymph nodes. Reproductive: Prostate is unremarkable. Other: Moderate free fluid within the pelvis. No abscess or pneumoperitoneum. Musculoskeletal: No acute or significant osseous findings. IMPRESSION: 1. Examination is positive for small bowel obstruction. Transition point is in the right lower quadrant of the abdomen. 2. Inflamed segment of small bowel identified within the right lower quadrant of the abdomen and access the transition point for the bowel obstruction. Compatible with segmental enteritis. Findings may be inflammatory or infectious in etiology. Inflammatory bowel disease not excluded. 3. Moderate free fluid noted within the pelvis. No abscess or pneumoperitoneum. 4. Status post cholecystectomy with mild intrahepatic bile duct dilatation and fusiform dilatation of the CBD. 5. Bilateral nonobstructing renal calculi. Electronically Signed   By:  TKerby MoorsM.D.   On: 09/01/2019 17:41   DG Abd Portable 1V  Result Date: 09/03/2019 CLINICAL DATA:  Small-bowel obstruction EXAM: PORTABLE ABDOMEN - 1 VIEW COMPARISON:  Abdominal radiograph dated 09/02/2019 FINDINGS: An enteric tube terminates in the stomach. The bowel gas pattern is normal. Dilute enteric contrast extends to the distal colon. No radio-opaque calculi are seen. Cholecystectomy clips are noted. IMPRESSION: Nonobstructive bowel gas pattern. Enteric tube terminates in the stomach. Electronically Signed   By: TZerita BoersM.D.   On: 09/03/2019 08:36   DG Abd Portable 1V-Small Bowel Obstruction Protocol-initial, 8 hr delay  Result Date: 09/02/2019 CLINICAL DATA:  Small-bowel obstruction EXAM: PORTABLE ABDOMEN - 1 VIEW COMPARISON:  CT abdomen pelvis 09/01/2019 FINDINGS: NG tube in the gastric fundus. Interval improvement in small bowel dilatation. There is gas in nondilated colon and rectum. Gas in nondilated small bowel. Skeletal structures intact. Postop cholecystectomy IMPRESSION: NG tube tip in the gastric fundus Resolving small-bowel obstruction. Electronically Signed   By: CFranchot GalloM.D.   On: 09/02/2019 18:45     LOS: 2 days   Time spent: More than 50% of that time was spent in counseling and/or coordination of care.  RAntonieta Pert MD [facility]  09/03/2019, 9:49  AM

## 2019-09-03 NOTE — Progress Notes (Signed)
[facility] Progress Note     Subjective: CC-  Feeling much better today. Abdomen softer. Denies n/v. Passing flatus and he had a BM last night. Xray shows resolving SBO with contrast in colon.  ROS: See above, otherwise other systems negative   Objective: Vital signs in last 24 hours: Temp:  [98 F (36.7 C)-98.7 F (37.1 C)] 98 F (36.7 C) (02/17 0507) Pulse Rate:  [59-99] 59 (02/17 0507) Resp:  [14-18] 14 (02/17 0507) BP: (131-145)/(70-83) 131/70 (02/17 0507) SpO2:  [99 %-100 %] 100 % (02/17 0507) Last BM Date: 09/02/19  Intake/Output from previous day: 02/16 0701 - 02/17 0700 In: 2508.3 [I.V.:2410.8; IV Piggyback:97.5] Out: 650 [Emesis/NG output:650] Intake/Output this shift: No intake/output data recorded.  PE: Gen:  Alert, NAD, pleasant Card:  RRR Pulm:  CTAB, no W/R/R, rate and effort normal Abd: Soft, nondistended, +BS, no HSM, nontender Ext:  no BUE/BLE edema, calves soft and nontender Psych: A&Ox4  Skin: no rashes noted, warm and dry   Lab Results:  Recent Labs    09/02/19 0358 09/03/19 0354  WBC 10.1 5.3  HGB 13.4 11.3*  HCT 40.0 33.8*  PLT 284 204   BMET Recent Labs    09/02/19 0358 09/03/19 0354  NA 142 142  K 4.0 3.7  CL 109 112*  CO2 23 24  GLUCOSE 119* 99  BUN 13 20  CREATININE 0.87 0.81  CALCIUM 8.6* 8.2*   PT/INR No results for input(s): LABPROT, INR in the last 72 hours. CMP     Component Value Date/Time   NA 142 09/03/2019 0354   NA 141 09/02/2014 1152   K 3.7 09/03/2019 0354   CL 112 (H) 09/03/2019 0354   CO2 24 09/03/2019 0354   GLUCOSE 99 09/03/2019 0354   BUN 20 09/03/2019 0354   BUN 17 09/02/2014 1152   CREATININE 0.81 09/03/2019 0354   CALCIUM 8.2 (L) 09/03/2019 0354   PROT 6.1 (L) 09/02/2019 0358   PROT 6.5 09/02/2014 1152   ALBUMIN 3.5 09/02/2019 0358   ALBUMIN 4.1 09/02/2014 1152   AST 13 (L) 09/02/2019 0358   ALT 12 09/02/2019 0358   ALKPHOS 65 09/02/2019 0358   BILITOT 2.5 (H) 09/02/2019  0358   BILITOT 1.9 (H) 09/02/2014 1152   GFRNONAA >60 09/03/2019 0354   GFRAA >60 09/03/2019 0354   Lipase     Component Value Date/Time   LIPASE 25 09/01/2019 1440       Studies/Results: CT Abdomen Pelvis W Contrast  Result Date: 09/01/2019 CLINICAL DATA:  Nausea vomiting and diarrhea. Nonlocalized intermittent abdominal pain for 1 week. EXAM: CT ABDOMEN AND PELVIS WITH CONTRAST TECHNIQUE: Multidetector CT imaging of the abdomen and pelvis was performed using the standard protocol following bolus administration of intravenous contrast. CONTRAST:  172mL OMNIPAQUE IOHEXOL 300 MG/ML  SOLN COMPARISON:  12/11/2012 FINDINGS: Lower chest: No acute abnormality. Hepatobiliary: Status post cholecystectomy. No suspicious liver abnormality. Mild intrahepatic bile duct dilatation with fusiform dilatation of the CBD measuring up to 1.1 cm. Pancreas: Unremarkable. No pancreatic ductal dilatation or surrounding inflammatory changes. Spleen: Normal in size without focal abnormality. Adrenals/Urinary Tract: Normal adrenal glands. 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 Bilateral kidney stones. The largest stone is in the left upper pole measuring 7 mm. Cortical defect within upper pole of left kidney may also represent changes from partial nephrectomy. Urinary bladder is negative. Stomach/Bowel: Stomach is nondistended. There is abnormal mid small bowel dilatation measuring up to 5.3 cm compatible with small bowel obstruction.  The transition point is in the right lower quadrant of the abdomen, image 61/2. Here, there is a segment of inflamed small bowel with abnormal wall thickening, mucosal enhancement and inflammation compatible with segmental enteritis. Decompressed terminal ileum noted. Colon is also decompressed the. Vascular/Lymphatic: No significant vascular findings are present. No enlarged abdominal or pelvic lymph nodes. Reproductive: Prostate is unremarkable. Other: Moderate free fluid within  the pelvis. No abscess or pneumoperitoneum. Musculoskeletal: No acute or significant osseous findings. IMPRESSION: 1. Examination is positive for small bowel obstruction. Transition point is in the right lower quadrant of the abdomen. 2. Inflamed segment of small bowel identified within the right lower quadrant of the abdomen and access the transition point for the bowel obstruction. Compatible with segmental enteritis. Findings may be inflammatory or infectious in etiology. Inflammatory bowel disease not excluded. 3. Moderate free fluid noted within the pelvis. No abscess or pneumoperitoneum. 4. Status post cholecystectomy with mild intrahepatic bile duct dilatation and fusiform dilatation of the CBD. 5. Bilateral nonobstructing renal calculi. Electronically Signed   By: Kerby Moors M.D.   On: 09/01/2019 17:41   DG Abd Portable 1V-Small Bowel Obstruction Protocol-initial, 8 hr delay  Result Date: 09/02/2019 CLINICAL DATA:  Small-bowel obstruction EXAM: PORTABLE ABDOMEN - 1 VIEW COMPARISON:  CT abdomen pelvis 09/01/2019 FINDINGS: NG tube in the gastric fundus. Interval improvement in small bowel dilatation. There is gas in nondilated colon and rectum. Gas in nondilated small bowel. Skeletal structures intact. Postop cholecystectomy IMPRESSION: NG tube tip in the gastric fundus Resolving small-bowel obstruction. Electronically Signed   By: Franchot Gallo M.D.   On: 09/02/2019 18:45    Anti-infectives: Anti-infectives (From admission, onward)   None       Assessment/Plan BPH Hyperplastic colon polyps Gilbert's syndrome GERD H/o cholecystectomy H/o partial nephrectomy for benign renal mass  Partial SBO - CT scan shows SBO with transition point in the RLQ, possibly due to stricture - last colonoscopy 2010 normal, due for another one  ID - none FEN - IVF, CLD VTE - SCDs, lovenox Foley - none Follow up - TBD  Plan - D/c NG tube and allow clear liquids, advance to full liquids as  tolerated. Mobilize. I will arrange for outpatient CT enterography.   LOS: 2 days    [place]ellington [place], [facility] 09/03/2019, 8:21 AM Please see Amion for pager number during day hours 7:00am-4:30pm

## 2019-09-03 NOTE — Discharge Summary (Signed)
Physician Discharge Summary  Michiel Fugett [id number] DOB: 05-29-1951 DOA: 09/01/2019  PCP: Patient, No Pcp Per  Admit date: 09/01/2019 Discharge date: 09/04/2019  Admitted From: home Disposition:  home  Recommendations for Outpatient Follow-up:  Follow up with PCP/GI/Surgery in 1-2 weeks Please obtain BMP/CBC in one week Please follow up on the following pending results:  Home Health:no  Equipment/Devices: none  Discharge Condition: Stable Code Status: full Diet recommendation:soft  Brief/Interim Summary:  69 year old male with history of remote atrial fibrillation, BPH, Gilbert's syndrome, hyperplastic colon polyps and GERD, history of cholecystectomy secondary to cholelithiasis 05/2012 LOA laparoscopically and robotically assisted partial nephrectomy secondary to benign right renal mass in 06/2012 presented with persistent abdominal pain x1 wk PTA initially seen and lumbar GI clinic 2/10 celiac panel labs were done and negative GI pathogen panel was still pending there was plan for CT abdomen pelvis if symptoms worsen and colonoscopy in a few week.  3 weeks ago had lower back pain after moving heavy objects. Seen in the ED labs with elevated bilirubin CT abdomen showed a small bowel obstruction with transition point in the right lower abdomen, segmental enteritis of the small bowel and bilateral nonobstructive renal calculi noted .  Surgery was consulted and patient was admitted  Discharge Diagnoses:  Assessment & Plan:   SBO: resolved. Tolerated soft diet well. Okay for d/c home after dinner if does okay w/ diet per surgery. Pt wanting to go hoem tonight before the storms. He will f/u w GI/Surgeryy and o/p CTE   Atrial fibrillation remote history currently normal sinus rhythm.  Not on anticoagulation.     Gilbert's syndrome, with elevated total bilirubin chronic.  Monitor   Renal calculi, bilateral, nonobstructive seen in the CT scan will need outpatient  follow-up   Consults: Surgery  Subjective: Tolerating diet. ngt out  Discharge Exam: Vitals:   09/03/19 1408 09/03/19 1430  BP: 127/75   Pulse: 66   Resp: 16   Temp: (!) 96.7 F (35.9 C) 98.7 F (37.1 C)  SpO2: 100%    General: Pt is alert, awake, not in acute distress Cardiovascular: RRR, S1/S2 +, no rubs, no gallops Respiratory: CTA bilaterally, no wheezing, no rhonchi Abdominal: Soft, NT, ND, bowel sounds + Extremities: no edema, no cyanosis  Discharge Instructions  Discharge Instructions     Diet - low sodium heart healthy   Complete by: As directed    SOFT DIET   Discharge instructions   Complete by: As directed    Please call call MD or return to ER for similar or worsening recurring problem that brought you to hospital or if any fever,nausea/vomiting,abdominal pain, uncontrolled pain, chest pain,  shortness of breath or any other alarming symptoms.  Please follow-up your doctor - surgeon and GI physician as instructed in a week time and call the office for appointment.  Please avoid alcohol, smoking, or any other illicit substance and maintain healthy habits including taking your regular medications as prescribed.  You were cared for by a hospitalist during your hospital stay. If you have any questions about your discharge medications or the care you received while you were in the hospital after you are discharged, you can call the unit and ask to speak with the hospitalist on call if the hospitalist that took care of you is not available.  Once you are discharged, your primary care physician will handle any further medical issues. Please note that NO REFILLS for any discharge medications will be authorized once you are discharged,  as it is imperative that you return to your primary care physician (or establish a relationship with a primary care physician if you do not have one) for your aftercare needs so that they can reassess your need for medications and monitor  your lab values.   Increase activity slowly   Complete by: As directed       Allergies as of 09/03/2019   No Known Allergies      Medication List     TAKE these medications    CoQ10 100 MG Caps Take 100 mg by mouth daily.   PROBIOTIC ACIDOPHILUS PO Take 1 capsule by mouth daily.   vitamin C 100 MG tablet Take 500 mg by mouth daily.       Follow-up Information     Leighton Ruff, MD. Call in 3 day(s).   Specialty: General Surgery Why: The office will call you with your appointment time and schedule your CT enterography. If you have not received a call by the end of the week, please call to schedule an appointment.  Contact information: [address] [place] [address] [phone]         [facility]. Call in 1 day(s).   Specialty: Gastroenterology Why: Please call to schedule a colonoscopy  Contact information: [place] [phone]          No Known Allergies  The results of significant diagnostics from this hospitalization (including imaging, microbiology, ancillary and laboratory) are listed below for reference.    Microbiology: Recent Results (from the past 240 hour(s))  SARS CORONAVIRUS 2 (TAT 6-24 HRS) Nasopharyngeal Nasopharyngeal Swab     Status: None   Collection Time: 09/01/19  7:15 PM   Specimen: Nasopharyngeal Swab  Result Value Ref Range Status   SARS Coronavirus 2 NEGATIVE NEGATIVE Final    Comment: (NOTE) SARS-CoV-2 target nucleic acids are NOT DETECTED. The SARS-CoV-2 RNA is generally detectable in upper and lower respiratory specimens during the acute phase of infection. Negative results do not preclude SARS-CoV-2 infection, do not rule out co-infections with other pathogens, and should not be used as the sole basis for treatment or other patient management decisions. Negative results must be combined with clinical observations, patient history, and  epidemiological information. The expected result is Negative. Fact Sheet for Patients: SugarRoll.be Fact Sheet for Healthcare Providers: https://www.woods-mathews.com/ This test is not yet approved or cleared by the [place] FDA and  has been authorized for detection and/or diagnosis of SARS-CoV-2 by FDA under an Emergency Use Authorization (EUA). This EUA will remain  in effect (meaning this test can be used) for the duration of the COVID-19 declaration under Section 56 4(b)(1) of the Act, 21 U.S.C. section 360bbb-3(b)(1), unless the authorization is terminated or revoked sooner. Performed at [facility], [place] [address], [place], [place] [address]     Procedures/Studies: CT Abdomen Pelvis W Contrast  Result Date: 09/01/2019 CLINICAL DATA:  Nausea vomiting and diarrhea. Nonlocalized intermittent abdominal pain for 1 week. EXAM: CT ABDOMEN AND PELVIS WITH CONTRAST TECHNIQUE: Multidetector CT imaging of the abdomen and pelvis was performed using the standard protocol following bolus administration of intravenous contrast. CONTRAST:  178mL OMNIPAQUE IOHEXOL 300 MG/ML  SOLN COMPARISON:  12/11/2012 FINDINGS: Lower chest: No acute abnormality. Hepatobiliary: Status post cholecystectomy. No suspicious liver abnormality. Mild intrahepatic bile duct dilatation with fusiform dilatation of the CBD measuring up to 1.1 cm. Pancreas: Unremarkable. No pancreatic ductal dilatation or surrounding inflammatory changes.  Spleen: Normal in size without focal abnormality. Adrenals/Urinary Tract: Normal adrenal glands. 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 Bilateral kidney stones. The largest stone is in the left upper pole measuring 7 mm. Cortical defect within upper pole of left kidney may also represent changes from partial nephrectomy. Urinary bladder is negative. Stomach/Bowel: Stomach is nondistended. There is abnormal mid small bowel dilatation  measuring up to 5.3 cm compatible with small bowel obstruction. The transition point is in the right lower quadrant of the abdomen, image 61/2. Here, there is a segment of inflamed small bowel with abnormal wall thickening, mucosal enhancement and inflammation compatible with segmental enteritis. Decompressed terminal ileum noted. Colon is also decompressed the. Vascular/Lymphatic: No significant vascular findings are present. No enlarged abdominal or pelvic lymph nodes. Reproductive: Prostate is unremarkable. Other: Moderate free fluid within the pelvis. No abscess or pneumoperitoneum. Musculoskeletal: No acute or significant osseous findings. IMPRESSION: 1. Examination is positive for small bowel obstruction. Transition point is in the right lower quadrant of the abdomen. 2. Inflamed segment of small bowel identified within the right lower quadrant of the abdomen and access the transition point for the bowel obstruction. Compatible with segmental enteritis. Findings may be inflammatory or infectious in etiology. Inflammatory bowel disease not excluded. 3. Moderate free fluid noted within the pelvis. No abscess or pneumoperitoneum. 4. Status post cholecystectomy with mild intrahepatic bile duct dilatation and fusiform dilatation of the CBD. 5. Bilateral nonobstructing renal calculi. Electronically Signed   By: Kerby Moors M.D.   On: 09/01/2019 17:41   DG Abd Portable 1V  Result Date: 09/03/2019 CLINICAL DATA:  Small-bowel obstruction EXAM: PORTABLE ABDOMEN - 1 VIEW COMPARISON:  Abdominal radiograph dated 09/02/2019 FINDINGS: An enteric tube terminates in the stomach. The bowel gas pattern is normal. Dilute enteric contrast extends to the distal colon. No radio-opaque calculi are seen. Cholecystectomy clips are noted. IMPRESSION: Nonobstructive bowel gas pattern. Enteric tube terminates in the stomach. Electronically Signed   By: Zerita Boers M.D.   On: 09/03/2019 08:36   DG Abd Portable 1V-Small Bowel  Obstruction Protocol-initial, 8 hr delay  Result Date: 09/02/2019 CLINICAL DATA:  Small-bowel obstruction EXAM: PORTABLE ABDOMEN - 1 VIEW COMPARISON:  CT abdomen pelvis 09/01/2019 FINDINGS: NG tube in the gastric fundus. Interval improvement in small bowel dilatation. There is gas in nondilated colon and rectum. Gas in nondilated small bowel. Skeletal structures intact. Postop cholecystectomy IMPRESSION: NG tube tip in the gastric fundus Resolving small-bowel obstruction. Electronically Signed   By: Franchot Gallo M.D.   On: 09/02/2019 18:45     Labs: BNP (last 3 results) No results for input(s): BNP in the last 8760 hours. Basic Metabolic Panel: Recent Labs  Lab 09/01/19 1440 09/02/19 0358 09/03/19 0354  NA 140 142 142  K 4.0 4.0 3.7  CL 107 109 112*  CO2 23 23 24   GLUCOSE 105* 119* 99  BUN 13 13 20   CREATININE 0.94 0.87 0.81  CALCIUM 8.5* 8.6* 8.2*  MG  --   --  2.0  PHOS  --   --  2.8   Liver Function Tests: Recent Labs  Lab 09/01/19 1440 09/02/19 0358  AST 14* 13*  ALT 13 12  ALKPHOS 70 65  BILITOT 1.9* 2.5*  PROT 6.4* 6.1*  ALBUMIN 3.6 3.5   Recent Labs  Lab 09/01/19 1440  LIPASE 25   No results for input(s): AMMONIA in the last 168 hours. CBC: Recent Labs  Lab 09/01/19 1440 09/02/19 0358 09/03/19 0354  WBC 9.6  10.1 5.3  HGB 13.7 13.4 11.3*  HCT 41.3 40.0 33.8*  MCV 91.4 91.5 93.4  PLT 286 284 204   Cardiac Enzymes: No results for input(s): CKTOTAL, CKMB, CKMBINDEX, TROPONINI in the last 168 hours. BNP: Invalid input(s): POCBNP CBG: No results for input(s): GLUCAP in the last 168 hours. D-Dimer No results for input(s): DDIMER in the last 72 hours. Hgb A1c No results for input(s): HGBA1C in the last 72 hours. Lipid Profile No results for input(s): CHOL, HDL, LDLCALC, TRIG, CHOLHDL, LDLDIRECT in the last 72 hours. Thyroid function studies No results for input(s): TSH, T4TOTAL, T3FREE, THYROIDAB in the last 72 hours.  Invalid input(s):  FREET3 Anemia work up No results for input(s): VITAMINB12, FOLATE, FERRITIN, TIBC, IRON, RETICCTPCT in the last 72 hours. Urinalysis    Component Value Date/Time   COLORURINE AMBER (A) 09/01/2019 1709   APPEARANCEUR HAZY (A) 09/01/2019 1709   LABSPEC 1.019 09/01/2019 1709   PHURINE 6.0 09/01/2019 1709   GLUCOSEU NEGATIVE 09/01/2019 1709   HGBUR LARGE (A) 09/01/2019 1709   BILIRUBINUR NEGATIVE 09/01/2019 1709   BILIRUBINUR neg 09/25/2013 1422   KETONESUR 80 (A) 09/01/2019 1709   PROTEINUR 30 (A) 09/01/2019 1709   UROBILINOGEN negative 09/25/2013 1422   UROBILINOGEN 4.0 (H) 07/10/2012 0428   NITRITE NEGATIVE 09/01/2019 1709   LEUKOCYTESUR NEGATIVE 09/01/2019 1709   Sepsis Labs Invalid input(s): PROCALCITONIN,  WBC,  LACTICIDVEN Microbiology Recent Results (from the past 240 hour(s))  SARS CORONAVIRUS 2 (TAT 6-24 HRS) Nasopharyngeal Nasopharyngeal Swab     Status: None   Collection Time: 09/01/19  7:15 PM   Specimen: Nasopharyngeal Swab  Result Value Ref Range Status   SARS Coronavirus 2 NEGATIVE NEGATIVE Final    Comment: (NOTE) SARS-CoV-2 target nucleic acids are NOT DETECTED. The SARS-CoV-2 RNA is generally detectable in upper and lower respiratory specimens during the acute phase of infection. Negative results do not preclude SARS-CoV-2 infection, do not rule out co-infections with other pathogens, and should not be used as the sole basis for treatment or other patient management decisions. Negative results must be combined with clinical observations, patient history, and epidemiological information. The expected result is Negative. Fact Sheet for Patients: SugarRoll.be Fact Sheet for Healthcare Providers: https://www.woods-mathews.com/ This test is not yet approved or cleared by the [place] FDA and  has been authorized for detection and/or diagnosis of SARS-CoV-2 by FDA under an Emergency Use Authorization (EUA). This EUA  will remain  in effect (meaning this test can be used) for the duration of the COVID-19 declaration under Section 56 4(b)(1) of the Act, 21 U.S.C. section 360bbb-3(b)(1), unless the authorization is terminated or revoked sooner. Performed at [facility], [place] [address] [place], [place] [address]      Time coordinating discharge: 25 minutes  SIGNED: Antonieta Pert, MD  [facility] 09/04/2019, 10:25 AM  If 7PM-7AM, please contact night-coverage [email or web address]

## 2019-09-06 ENCOUNTER — Other Ambulatory Visit: Payer: Self-pay | Admitting: General Surgery

## 2019-09-06 DIAGNOSIS — K56609 Unspecified intestinal obstruction, unspecified as to partial versus complete obstruction: Secondary | ICD-10-CM

## 2019-09-10 ENCOUNTER — Other Ambulatory Visit: Payer: Self-pay

## 2019-09-10 ENCOUNTER — Encounter: Payer: Self-pay | Admitting: Nurse Practitioner

## 2019-09-10 ENCOUNTER — Ambulatory Visit (INDEPENDENT_AMBULATORY_CARE_PROVIDER_SITE_OTHER): Payer: Medicare Other | Admitting: Nurse Practitioner

## 2019-09-10 VITALS — BP 120/72 | HR 79 | Temp 98.1°F | Ht 74.0 in | Wt 193.0 lb

## 2019-09-10 DIAGNOSIS — Z01818 Encounter for other preprocedural examination: Secondary | ICD-10-CM | POA: Diagnosis not present

## 2019-09-10 DIAGNOSIS — R112 Nausea with vomiting, unspecified: Secondary | ICD-10-CM | POA: Diagnosis not present

## 2019-09-10 DIAGNOSIS — K56609 Unspecified intestinal obstruction, unspecified as to partial versus complete obstruction: Secondary | ICD-10-CM | POA: Diagnosis not present

## 2019-09-10 DIAGNOSIS — K529 Noninfective gastroenteritis and colitis, unspecified: Secondary | ICD-10-CM | POA: Diagnosis not present

## 2019-09-10 NOTE — Patient Instructions (Addendum)
If you are age 69 or older, your body mass index should be between 23-30. Your Body mass index is 24.78 kg/m. If this is out of the aforementioned range listed, please consider follow up with your Primary Care Provider.  If you are age 13 or younger, your body mass index should be between 19-25. Your Body mass index is 24.78 kg/m. If this is out of the aformentioned range listed, please consider follow up with your Primary Care Provider.   You have a CT scheduled at [facility] at 11:40 on 3/09/17/2019 please arrive to this appointment one and a half hours early. Nothing to eat or drink after midnight  You have been scheduled for an endoscopy and colonoscopy. Please follow the written instructions given to you at your visit today. Please pick up your prep supplies at the pharmacy within the next 1-3 days. If you use inhalers (even only as needed), please bring them with you on the day of your procedure.  Due to recent changes in healthcare laws, you may see the results of your imaging and laboratory studies on MyChart before your provider has had a chance to review them.  We understand that in some cases there may be results that are confusing or concerning to you. Not all laboratory results come back in the same time frame and the provider may be waiting for multiple results in order to interpret others.  Please give [place] 48 hours in order for your provider to thoroughly review all the results before contacting the office for clarification of your results.    Prep given to patient  Thank you for choosing [facility] Noralyn Pick, CRNP

## 2019-09-10 NOTE — Progress Notes (Addendum)
09/10/2019 Kevin Doyle [id number] Mar 31, 1951   Chief Complaint:  Small bowel obstruction   History of Present Illness: Kevin Doyle is a 69 year old male with a past medical history of remote atrial fibrillation, BPH, Gilbert's Syndrome, hyperplastic colon polyps and GERD.  S/P cholecystectomy secondary to cholelithiasis 05/2012, laparoscopic lysis of adhesions and robotic assisted partial nephrectomy secondary to benign right renal mass 06/2012. I saw the patient in office on 08/27/2019 with complaints of nausea, vomiting, upper abdominal pain and diarrhea 08/24/2019. His symptoms improved over the next 2 days.  At the time of his office appointment on 2/10 he reported feeling much better.  No further  Nausea or vomiting and his central abdominal pain was significantly reduced. On exam, his abdomen was soft, nondistended with mild tenderness above the umbilicus.  The plan was to obtain an abdominal/pelvic CAT scan if his nausea/vomiting recurred or if his abdominal pain worsened.  His symptoms continued to improved over the next few days.  However, he reported eating a large quantity of air fried french fries on  08/31/2019 which resulted in recurrence of his central abdominal pain and dry heaves.  He presented to [facility] emergency room 09/01/2019 for further evaluation.  His labs showed a normal WBC 9.6. Elevated total bilirubin level 2.5 with normal AST/ALT ( history of Gilbert's Syndrome). An abdominal/pelvic CT showed a SBO with transition points in the right lower abdomen, segmental enteritis of the small bowel and bilateral non-obstructing renal calculi.  He was evaluated by general surgeon, Dr. Erroll Luna.  A NGT was inserted for decompression and the patient was placed on bowel rest with IV fluids. Dr. Carlota Raspberry assessed his SBO could possibly be due to adhesions from past abdominal surgeries but more likely inflammatory in nature.  Surgical intervention was not required.  His NGT was  discontinued and his diet was advanced to a clear liquid and full diet.  He was discharged home on 09/04/2019 with plans to have a CT enterography in the next few weeks.  He presents today for further GI evaluation to schedule a colonoscopy. His wife is present. He denies having any N/V, dry heaves or abdominal pain since being discharged home.  He is eating a soft bland diet consisting of chicken or beef broth, salmon, cooked vegetables, soft fruit, yogurt and oatmeal.  His diarrhea resolved prior to going to the emergency room therefore he did not submit the GI panel which was previously ordered.  He is passing a soft formed brown bowel movement most days.  However, Sunday 2/21 he felt constipated and he did not pass a bowel movement.  He passed a normal soft stool on 2/22 and 2/23.  No BM yet today.  No melena.  He also reported having a temperature of 103.3 on Sunday 2/21 around 9pm. He described feeling shaky and a little lightheaded.  He took Advil 200mg  two tablets and 3 hours later he took Tylenol 325 mg 2 tablets.  He was able to sleep throughout the rest of the night.  Monday 2/22 he felt fine without any noticeable fever.  Tuesday 2/23 had a temperature of 100.5. No fever today.  Overall, he reports feeling quite well today. He is due for his 2nd Covid vaccination tomorrow.   CBC Latest Ref Rng & Units 09/03/2019 09/02/2019 09/01/2019  WBC 4.0 - 10.5 K/uL 5.3 10.1 9.6  Hemoglobin 13.0 - 17.0 g/dL 11.3(L) 13.4 13.7  Hematocrit 39.0 - 52.0 % 33.8(L) 40.0 41.3  Platelets 150 -  400 K/uL 204 284 286   CMP Latest Ref Rng & Units 09/03/2019 09/02/2019 09/01/2019  Glucose 70 - 99 mg/dL 99 119(H) 105(H)  BUN 8 - 23 mg/dL 20 13 13   Creatinine 0.61 - 1.24 mg/dL 0.81 0.87 0.94  Sodium 135 - 145 mmol/L 142 142 140  Potassium 3.5 - 5.1 mmol/L 3.7 4.0 4.0  Chloride 98 - 111 mmol/L 112(H) 109 107  CO2 22 - 32 mmol/L 24 23 23   Calcium 8.9 - 10.3 mg/dL 8.2(L) 8.6(L) 8.5(L)  Total Protein 6.5 - 8.1 g/dL - 6.1(L)  6.4(L)  Total Bilirubin 0.3 - 1.2 mg/dL - 2.5(H) 1.9(H)  Alkaline Phos 38 - 126 U/L - 65 70  AST 15 - 41 U/L - 13(L) 14(L)  ALT 0 - 44 U/L - 12 13    Abdominal/Pelvic CT with contrast 09/01/2019: 1. Examination is positive for small bowel obstruction. Transition point is in the right lower quadrant of the abdomen. 2. Inflamed segment of small bowel identified within the right lower quadrant of the abdomen and access the transition point for the bowel obstruction. Compatible with segmental enteritis. Findings may be inflammatory or infectious in etiology. Inflammatory bowel disease not excluded. 3. Moderate free fluid noted within the pelvis. No abscess or pneumoperitoneum. 4. Status post cholecystectomy with mild intrahepatic bile duct dilatation and fusiform dilatation of the CBD. 5. Bilateral nonobstructing renal calculi.  EGD 04/29/2012: By Dr. Verl Blalock  Abnormal gastric fundal mucosa, otherwise normal.  -Biopsies identified mild chronic inactive gastritis.  No evidence of H. pylori.  ERCP 07/11/2012 by Dr. Erskine Emery: Choledocholithiasis status post sphincterotomy and stone extraction  Colonoscopy 06/02/2009 by Dr. Verl Blalock: - No polyps -Recall colonoscopy 10 years  Colonoscopy Dr. Verl Blalock 04/08/2004: Multiple polyps from to the descending and sigmoid colon measuring 2 to 6 mm.  Current Medications, Allergies, Past Medical History, Past Surgical History, Family History and Social History were reviewed in [employer] record.   Physical Exam: BP 120/72 (BP Location: Right Arm, Patient Position: Sitting, Cuff Size: Normal)   Pulse 79   Temp 98.1 F (36.7 C) (Oral)   Ht 6\' 2"  (1.88 m)   Wt 193 lb (87.5 kg)   BMI 24.78 kg/m  General: Well developed  69 year old male in no acute distress. Head: Normocephalic and atraumatic. Eyes:  No scleral icterus. Conjunctiva pink . Ears: Normal auditory acuity. Lungs: Clear  throughout to auscultation. Heart: Regular rate and rhythm, no murmur. Abdomen: Soft, nontender and nondistended. No masses or hepatomegaly. Normal bowel sounds x 4 quadrants.  Rectal: Deferred.  Musculoskeletal: Symmetrical with no gross deformities. Extremities: No edema. Neurological: Alert oriented x 4. No focal deficits.  Psychological:  Alert and cooperative. Normal mood and affect  Assessment and Recommendations:  27. 69 year old male with N/V and supraumbilical abdominal pain diagnosed with a partial SBO and segmental enteritis per CTAP 09/01/2019. No surgical intervention was required.  -CT enterography with contrast  -EGD and colonoscopy to be scheduled in 4 weeks. Benefits and risks discussed including risk with sedation, risk of bleeding, perforation and infection  -Avoid overeating. Eat 4 smaller snack size meals, soft diet, cooked vegetables, avoid heavy/fatty foods. Drink 8 glasses of water. Avoid dehydration.   2. Diarrhea, resolved -Patient to call our office if his diarrhea recurs  3. Fever 103.3 2/21 and 100.5 on 2/23 per home thermometer. Afebrile in office  today. No cough/SOB. No abdominal pain.  -Defer 2nd Covid vaccination for 1 week -Patient  to call office if fever recurs.  -If fever recurs, I will order a stat CBC, CMP, COVID 19 test and stat CT enterography/abd/pelvic  4. Gilbert's Syndrome  ADDENDUM 09/12/2019: I called the patient this morning for an update. He denied having any further fever yesterday or this morning. He will call our office if his fever recurs.

## 2019-09-11 ENCOUNTER — Telehealth: Payer: Self-pay | Admitting: Nurse Practitioner

## 2019-09-11 NOTE — Telephone Encounter (Signed)
I called the patient for an update, no further fever yesterday or this am. He will call our office if his fever recurs. Refer to office visit 09/10/2019.

## 2019-09-12 ENCOUNTER — Other Ambulatory Visit: Payer: Medicare Other

## 2019-09-17 ENCOUNTER — Ambulatory Visit
Admission: RE | Admit: 2019-09-17 | Discharge: 2019-09-17 | Disposition: A | Discharge status: Home / Self Care | Payer: Medicare Other | Source: Ambulatory Visit | Attending: General Surgery | Admitting: General Surgery

## 2019-09-17 ENCOUNTER — Other Ambulatory Visit: Payer: Self-pay

## 2019-09-17 DIAGNOSIS — K529 Noninfective gastroenteritis and colitis, unspecified: Secondary | ICD-10-CM

## 2019-09-17 DIAGNOSIS — K56609 Unspecified intestinal obstruction, unspecified as to partial versus complete obstruction: Secondary | ICD-10-CM

## 2019-09-17 MED ORDER — IOPAMIDOL (ISOVUE-300) INJECTION 61%
100.0000 mL | Freq: Once | INTRAVENOUS | Status: AC | PRN
  Administered 2019-09-17: 100 mL via INTRAVENOUS

## 2019-09-18 ENCOUNTER — Telehealth: Payer: Self-pay | Admitting: Nurse Practitioner

## 2019-09-18 NOTE — Progress Notes (Signed)
Dr. Bryan Lemma, [place] CT enterography 3/3//2021 results as follows: 1. Decreased small bowel dilatation since previous study, consistent with improving partial small bowel obstruction. 2. Persistent mild to moderate wall thickening and mucosal enhancement involving pelvic distal small bowel loops including the terminal ileum. Differential diagnosis includes Crohn disease and infectious enteritis. No evidence of abscess or other complication.  I called patietn with results. No abdominal pain. Bms normal.  Pt to proceed with EGD/colonoscopy as scheduled.

## 2019-09-18 NOTE — Telephone Encounter (Signed)
Pt stated that he has follow up questions.  Please return his call.

## 2019-09-18 NOTE — Telephone Encounter (Signed)
Called and spoke with patient-patient is requesting to know if he should get the [place] he is about "1 month late on getting the second vaccine due to my health issues and such";  Colleen-please advise    Alyse Low- will you please process this patient's amb ref for his EGD and colon and call him and let him know if his insurance is going to cover these procedures or what portion is covered as he may not proceed with these procedures if he is "going to have to pay a lot out of pocket";  Please call him

## 2019-09-18 NOTE — Telephone Encounter (Signed)
Bre, pls call pt. He can get 2nd covid injection. thx

## 2019-09-18 NOTE — Progress Notes (Signed)
Agree with the assessment and plan as outlined by Colleen Kennedy-Smith, NP.   

## 2019-09-19 NOTE — Telephone Encounter (Signed)
Spoke with patient can confirmed he has standard medicare and [employer] [place].  No auth needed and double coverage

## 2019-09-19 NOTE — Telephone Encounter (Signed)
Called and spoke with patient-patient informed of provider's recommendations and is agreeable to plan of care; Patient advised to call back to the office at [phone] should questions/concerns arise;  Patient verbalized understanding of information/instructions;

## 2019-10-06 ENCOUNTER — Other Ambulatory Visit: Payer: Self-pay | Admitting: Gastroenterology

## 2019-10-06 ENCOUNTER — Ambulatory Visit (INDEPENDENT_AMBULATORY_CARE_PROVIDER_SITE_OTHER): Payer: Medicare Other

## 2019-10-06 DIAGNOSIS — Z1159 Encounter for screening for other viral diseases: Secondary | ICD-10-CM

## 2019-10-07 LAB — SARS CORONAVIRUS 2 (TAT 6-24 HRS): SARS Coronavirus 2: NEGATIVE

## 2019-10-08 ENCOUNTER — Other Ambulatory Visit: Payer: Self-pay

## 2019-10-08 ENCOUNTER — Encounter: Payer: Self-pay | Admitting: Gastroenterology

## 2019-10-08 ENCOUNTER — Ambulatory Visit (AMBULATORY_SURGERY_CENTER): Payer: Medicare Other | Admitting: Gastroenterology

## 2019-10-08 VITALS — BP 129/69 | HR 61 | Temp 97.1°F | Resp 15 | Ht 74.0 in | Wt 193.0 lb

## 2019-10-08 DIAGNOSIS — K2980 Duodenitis without bleeding: Secondary | ICD-10-CM | POA: Diagnosis not present

## 2019-10-08 DIAGNOSIS — K529 Noninfective gastroenteritis and colitis, unspecified: Secondary | ICD-10-CM | POA: Diagnosis not present

## 2019-10-08 DIAGNOSIS — K640 First degree hemorrhoids: Secondary | ICD-10-CM | POA: Diagnosis not present

## 2019-10-08 DIAGNOSIS — K279 Peptic ulcer, site unspecified, unspecified as acute or chronic, without hemorrhage or perforation: Secondary | ICD-10-CM | POA: Diagnosis not present

## 2019-10-08 DIAGNOSIS — K2970 Gastritis, unspecified, without bleeding: Secondary | ICD-10-CM

## 2019-10-08 DIAGNOSIS — D122 Benign neoplasm of ascending colon: Secondary | ICD-10-CM | POA: Diagnosis not present

## 2019-10-08 DIAGNOSIS — K633 Ulcer of intestine: Secondary | ICD-10-CM

## 2019-10-08 DIAGNOSIS — F502 Bulimia nervosa: Secondary | ICD-10-CM

## 2019-10-08 DIAGNOSIS — K2951 Unspecified chronic gastritis with bleeding: Secondary | ICD-10-CM | POA: Diagnosis not present

## 2019-10-08 DIAGNOSIS — K56699 Other intestinal obstruction unspecified as to partial versus complete obstruction: Secondary | ICD-10-CM

## 2019-10-08 DIAGNOSIS — K644 Residual hemorrhoidal skin tags: Secondary | ICD-10-CM | POA: Diagnosis not present

## 2019-10-08 HISTORY — PX: COLONOSCOPY: SHX174

## 2019-10-08 HISTORY — PX: UPPER GASTROINTESTINAL ENDOSCOPY: SHX188

## 2019-10-08 MED ORDER — PANTOPRAZOLE SODIUM 40 MG PO TBEC: 40.0000 mg | DELAYED_RELEASE_TABLET | Freq: Two times a day (BID) | ORAL | 3 refills | Status: DC

## 2019-10-08 MED ORDER — SODIUM CHLORIDE 0.9 % IV SOLN: 500.0000 mL | Freq: Once | INTRAVENOUS | Status: DC

## 2019-10-08 NOTE — Progress Notes (Signed)
Cw vitals, LC temp and JB IV.

## 2019-10-08 NOTE — Op Note (Signed)
[place] Patient Name: Kevin Doyle Procedure Date: 10/08/2019 8:59 AM MRN: [id number] Endoscopist: Gerrit Heck , MD Age: 69 Referring MD:  Date of Birth: 1950-12-10 Gender: Male Account #: [id number] Procedure:                Colonoscopy Indications:              Lower abdominal pain, Exclusion of Crohn's disease,                            Abnormal CT of the GI tract                           Recent admission for partial SBO and segmental                            enteritis noted on CTAP 09/01/2019. Subsequent CTE                            with improvement but still with mild to moderate                            wall thickening in the distal small bowel and                            terminal ileum. Presents today for endoscopic                            evaluation. Medicines:                Monitored Anesthesia Care Procedure:                Pre-Anesthesia Assessment:                           - Prior to the procedure, a History and Physical                            was performed, and patient medications and                            allergies were reviewed. The patient's tolerance of                            previous anesthesia was also reviewed. The risks                            and benefits of the procedure and the sedation                            options and risks were discussed with the patient.                            All questions were answered, and informed consent  was obtained. Prior Anticoagulants: The patient has                            taken no previous anticoagulant or antiplatelet                            agents. ASA Grade Assessment: II - A patient with                            mild systemic disease. After reviewing the risks                            and benefits, the patient was deemed in                            satisfactory condition to undergo the procedure.                           After  obtaining informed consent, the colonoscope                            was passed under direct vision. Throughout the                            procedure, the patient's blood pressure, pulse, and                            oxygen saturations were monitored continuously. The                            Colonoscope was introduced through the anus and                            advanced to the 8 cm into the ileum. The                            colonoscopy was performed without difficulty. The                            patient tolerated the procedure well. The quality                            of the bowel preparation was adequate. The terminal                            ileum, ileocecal valve, appendiceal orifice, and                            rectum were photographed. Scope In: 9:29:59 AM Scope Out: 10:01:44 AM Scope Withdrawal Time: 0 hours 24 minutes 45 seconds  Total Procedure Duration: 0 hours 31 minutes 45 seconds  Findings:                 Skin tags were found on perianal exam.  A 4 mm polyp was found in the ascending colon. The                            polyp was sessile. The polyp was removed with a                            cold snare. Resection and retrieval were complete.                            Estimated blood loss was minimal.                           Normal mucosa was otherwise found in the entire                            colon. Biopsies for histology were taken with a                            cold forceps from the right colon and left colon                            for evaluation of microscopic colitis, Inflammatory                            Bowel Disease. Estimated blood loss was minimal.                           Non-bleeding internal hemorrhoids were found during                            retroflexion. The hemorrhoids were medium-sized.                           The terminal ileum contained a benign-appearing,                             intrinsic moderate stenosis measuring less than one                            cm (in length) x 6 mm (inner diameter) that was                            non-traversed. This was located approximately 8 cm                            from the IC valve. Biopsies were taken with a cold                            forceps for histology and for fracturing of the                            stenosis. Estimated blood loss was minimal.  The mucosa proximal to the stenosis was visible and                            notable for a single (solitary) ulcer immediately                            proximal to the stenosis. No bleeding was present.                            Biopsies were taken with a cold forceps for                            histology. Estimated blood loss was minimal. The                            remainder of the ileal mucosa was normal appearing. Complications:            No immediate complications. Estimated Blood Loss:     Estimated blood loss was minimal. Impression:               - Perianal skin tags found on perianal exam.                           - One 4 mm polyp in the ascending colon, removed                            with a cold snare. Resected and retrieved.                           - Normal mucosa in the entire examined colon.                            Biopsied.                           - Non-bleeding internal hemorrhoids.                           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 Biopsied.                           - A single (solitary) ulcer in the terminal ileum.                            Biopsied. Recommendation:           - Patient has a contact number available for                            emergencies. The signs and symptoms of potential                            delayed complications were discussed with the  patient. Return to normal activities tomorrow.                            Written discharge  instructions were provided to the                            patient.                           - Resume low residue diet.                           - Continue present medications.                           - Await pathology results.                           - Return to GI clinic in 4-6 weeks. Depending on                            biopsy results and symptoms, will discuss the role                            of observation, medical management, or repeat                            colonoscopy with balloon dilation of the ileal                            stricture. Gerrit Heck, MD 10/08/2019 10:22:54 AM

## 2019-10-08 NOTE — Op Note (Signed)
[place] Patient Name: Kevin Doyle Procedure Date: 10/08/2019 8:59 AM MRN: [id number] Endoscopist: Gerrit Heck , MD Age: 69 Referring MD:  Date of Birth: Sep 12, 1950 Gender: Male Account #: [id number] Procedure:                Upper GI endoscopy Indications:              Lower abdominal pain, Abnormal CT of the GI tract,                            Nausea with vomiting. Recent admission for partial                            SBO and segmental enteritis noted on CTAP                            09/01/2019. Subsequent CTE with improvement but                            still with mild to moderate wall thickening in the                            distal small bowel and terminal ileum. Presents                            today for endoscopic evaluation. Medicines:                Monitored Anesthesia Care Procedure:                Pre-Anesthesia Assessment:                           - Prior to the procedure, a History and Physical                            was performed, and patient medications and                            allergies were reviewed. The patient's tolerance of                            previous anesthesia was also reviewed. The risks                            and benefits of the procedure and the sedation                            options and risks were discussed with the patient.                            All questions were answered, and informed consent                            was obtained. Prior Anticoagulants: The patient has  taken no previous anticoagulant or antiplatelet                            agents. ASA Grade Assessment: II - A patient with                            mild systemic disease. After reviewing the risks                            and benefits, the patient was deemed in                            satisfactory condition to undergo the procedure.                           After obtaining informed consent,  the endoscope was                            passed under direct vision. Throughout the                            procedure, the patient's blood pressure, pulse, and                            oxygen saturations were monitored continuously. The                            Endoscope was introduced through the mouth, and                            advanced to the third part of duodenum. The upper                            GI endoscopy was accomplished without difficulty.                            The patient tolerated the procedure well. Scope In: Scope Out: Findings:                 The examined esophagus was normal.                           The Z-line was regular and was found 42 cm from the                            incisors.                           Scattered mild inflammation characterized by                            erythema was found in the gastric fundus, in the                            gastric body,  at the incisura and in the gastric                            antrum. Biopsies were taken with a cold forceps for                            histology. Estimated blood loss was minimal.                           Localized moderate inflammation characterized by                            congestion (edema), erosions and erythema was found                            in the duodenal bulb. Biopsies were taken with a                            cold forceps for histology. Estimated blood loss                            was minimal.                           The second portion of the duodenum and third                            portion of the duodenum were normal. Complications:            No immediate complications. Estimated Blood Loss:     Estimated blood loss was minimal. Impression:               - Normal esophagus.                           - Z-line regular, 42 cm from the incisors.                           - Gastritis. Biopsied.                           - Duodenitis.  Biopsied.                           - Normal second portion of the duodenum and third                            portion of the duodenum. Recommendation:           - Patient has a contact number available for                            emergencies. The signs and symptoms of potential                            delayed complications were discussed with the  patient. Return to normal activities tomorrow.                            Written discharge instructions were provided to the                            patient.                           - Resume previous diet.                           - Continue present medications.                           - Await pathology results.                           - Use Protonix (pantoprazole) 40 mg PO BID for 8                            weeks to promote mucosal healing.                           - Perform a colonoscopy today.                           - Return to GI clinic at appointment to be                            scheduled in 4-6 weeks. Gerrit Heck, MD 10/08/2019 10:09:49 AM

## 2019-10-08 NOTE — Progress Notes (Signed)
Report to PACU, RN, vss, BBS= Clear.  

## 2019-10-08 NOTE — Progress Notes (Signed)
Called to room to assist during endoscopic procedure.  Patient ID and intended procedure confirmed with present staff. Received instructions for my participation in the procedure from the performing physician.  

## 2019-10-08 NOTE — Patient Instructions (Signed)
YOU HAD AN ENDOSCOPIC PROCEDURE TODAY AT THE [facility]:   Refer to the procedure report that was given to you for any specific questions about what was found during the examination.  If the procedure report does not answer your questions, please call your gastroenterologist to clarify.  If you requested that your care partner not be given the details of your procedure findings, then the procedure report has been included in a sealed envelope for you to review at your convenience later.  YOU SHOULD EXPECT: Some feelings of bloating in the abdomen. Passage of more gas than usual.  Walking can help get rid of the air that was put into your GI tract during the procedure and reduce the bloating. If you had a lower endoscopy (such as a colonoscopy or flexible sigmoidoscopy) you may notice spotting of blood in your stool or on the toilet paper. If you underwent a bowel prep for your procedure, you may not have a normal bowel movement for a few days.  Please Note:  You might notice some irritation and congestion in your nose or some drainage.  This is from the oxygen used during your procedure.  There is no need for concern and it should clear up in a day or so.  SYMPTOMS TO REPORT IMMEDIATELY:   Following lower endoscopy (colonoscopy or flexible sigmoidoscopy):  Excessive amounts of blood in the stool  Significant tenderness or worsening of abdominal pains  Swelling of the abdomen that is new, acute  Fever of 100F or higher   Following upper endoscopy (EGD)  Vomiting of blood or coffee ground material  New chest pain or pain under the shoulder blades  Painful or persistently difficult swallowing  New shortness of breath  Fever of 100F or higher  Black, tarry-looking stools  For urgent or emergent issues, a gastroenterologist can be reached at any hour by calling [phone]. Do not use MyChart messaging for urgent concerns.    DIET:  We do recommend a small meal at first, but  then you may proceed to your regular diet.  Drink plenty of fluids but you should avoid alcoholic beverages for 24 hours.  ACTIVITY:  You should plan to take it easy for the rest of today and you should NOT DRIVE or use heavy machinery until tomorrow (because of the sedation medicines used during the test).    FOLLOW UP: Our staff will call the number listed on your records 48-72 hours following your procedure to check on you and address any questions or concerns that you may have regarding the information given to you following your procedure. If we do not reach you, we will leave a message.  We will attempt to reach you two times.  During this call, we will ask if you have developed any symptoms of COVID 19. If you develop any symptoms (ie: fever, flu-like symptoms, shortness of breath, cough etc.) before then, please call [phone].  If you test positive for Covid 19 in the 2 weeks post procedure, please call and report this information to us.    If any biopsies were taken you will be contacted by phone or by letter within the next 1-3 weeks.  Please call us at [phone] if you have not heard about the biopsies in 3 weeks.    SIGNATURES/CONFIDENTIALITY: You and/or your care partner have signed paperwork which will be entered into your electronic medical record.  These signatures attest to the fact that that the information above on   your After Visit Summary has been reviewed and is understood.  Full responsibility of the confidentiality of this discharge information lies with you and/or your care-partner. 

## 2019-10-08 NOTE — Progress Notes (Signed)
Return to GI clinic in 4-6 weeks at appointment to be scheduled.

## 2019-10-10 ENCOUNTER — Telehealth: Payer: Self-pay | Admitting: *Deleted

## 2019-10-10 NOTE — Telephone Encounter (Signed)
  Follow up Call-  Call back number 10/08/2019  Post procedure Call Back phone  # [phone]  Permission to leave phone message Yes  Some recent data might be hidden     Patient questions:  Do you have a fever, pain , or abdominal swelling? No. Pain Score  0 *  Have you tolerated food without any problems? Yes.    Have you been able to return to your normal activities? Yes.    Do you have any questions about your discharge instructions: Diet   No. Medications  No. Follow up visit  No.  Do you have questions or concerns about your Care? No.  Actions: * If pain score is 4 or above: No action needed, pain <4.  1. Have you developed a fever since your procedure? no  2.   Have you had an respiratory symptoms (SOB or cough) since your procedure? no  3.   Have you tested positive for COVID 19 since your procedure no  4.   Have you had any family members/close contacts diagnosed with the COVID 19 since your procedure?  no   If yes to any of these questions please route to Joylene Shariq, RN and Erenest Rasher, RN

## 2019-11-06 ENCOUNTER — Encounter: Payer: Self-pay | Admitting: Gastroenterology

## 2019-11-06 ENCOUNTER — Ambulatory Visit (INDEPENDENT_AMBULATORY_CARE_PROVIDER_SITE_OTHER): Payer: Medicare Other | Admitting: Gastroenterology

## 2019-11-06 ENCOUNTER — Other Ambulatory Visit: Payer: Self-pay

## 2019-11-06 VITALS — BP 134/76 | HR 72 | Temp 97.3°F | Ht 74.0 in | Wt 194.4 lb

## 2019-11-06 DIAGNOSIS — Z86010 Personal history of colonic polyps: Secondary | ICD-10-CM

## 2019-11-06 DIAGNOSIS — K56699 Other intestinal obstruction unspecified as to partial versus complete obstruction: Secondary | ICD-10-CM | POA: Diagnosis not present

## 2019-11-06 DIAGNOSIS — K2970 Gastritis, unspecified, without bleeding: Secondary | ICD-10-CM

## 2019-11-06 NOTE — Patient Instructions (Signed)
Your provider has requested that you go to the basement level for lab work at [facility] in [place]  [address]. Press "B" on the elevator. The lab is located at the first door on the left as you exit the elevator.  You will be notified in 6 months to schedule your colonoscopy  It was a pleasure to see you today!  Vito Cirigliano, D.O.

## 2019-11-06 NOTE — Progress Notes (Signed)
P  Chief Complaint:    Procedure follow-up  GI History: 69 year old male with history of remote A. 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  -CT (09/01/2019, hospital admission): SBO with transition point in right lower abdomen, segmental enteritis of small bowel -Managed conservatively with NGT, n.p.o., IV fluids  -CT enterography (09/17/2019): Decreased small bowel dilation compared with previous c/w improved partial SBO.  Persistent mild to moderate wall thickening and mucosal enhancement in distal small bowel/TI  Endoscopic history: -EGD (09/2019, Dr. Bryan Lemma): Mild gastritis and duodenitis -Colonoscopy (09/2019): 4 mm tubular adenoma, otherwise normal colon (normal biopsies).  Internal hemorrhoids.  Stenosis in TI <1cm in length, 6 mm diameter located 8 cm from ICV.  Not traversable.  Single ulcer proximal to the stenosis residual (biopsy: Active inflammation)  HPI:     Patient is a 69 y.o. male presenting to the Gastroenterology Clinic for follow-up.  EGD/colonoscopy last month.  EGD with mild gastritis/duodenitis, treated with Protonix 40 mg bid x8 weeks.  Colonoscopy with <1cm long stricture in the terminal ileum that was nontraversable.  Fractured with cold forceps.  Biopsies with active inflammation, highly suspicious for ileal Crohn's disease.  Today, he states he feels well and back to his usual state of health.  Tolerating all p.o. intake without issue.  Bowel habits back to baseline, which is variable based on p.o. intake, and has been that way for his entire adult life.  Review of systems:     No chest pain, no SOB, no fevers, no urinary sx   Past Medical History:  Diagnosis Date  . Atrial fibrillation ([place])    last episode was 02/10/13  . GERD (gastroesophageal reflux disease)   . Gilbert's disease     Patient's surgical history,  family medical history, social history, medications and allergies were all reviewed in Epic    Current Outpatient Medications  Medication Sig Dispense Refill  . Ascorbic Acid (VITAMIN C) 100 MG tablet Take 500 mg by mouth daily.    . Coenzyme Q10 (COQ10) 100 MG CAPS Take 100 mg by mouth daily.     . Multiple Vitamin (MULTIVITAMIN) tablet Take 1 tablet by mouth daily.    . pantoprazole (PROTONIX) 40 MG tablet Take 1 tablet (40 mg total) by mouth 2 (two) times daily. Take for 8 weeks to promote mucosal healing. 90 tablet 3   No current facility-administered medications for this visit.    Physical Exam:     BP 134/76   Pulse 72   Temp (!) 97.3 F (36.3 C)   Ht '6\' 2"'$  (1.88 m)   Wt 194 lb 6 oz (88.2 kg)   BMI 24.96 kg/m   GENERAL:  Pleasant male in NAD PSYCH: : Cooperative, normal affect NEURO: Alert and oriented x 3, no focal neurologic deficits   IMPRESSION and PLAN:    1) Ileal stricture 2) History of partial SBO  Colonoscopy last month with a focal stricture in the distal ileum, measuring <1 cm in length and 6 mm diameter.  Unable to traverse, but fractured the ring with cold forceps.  Biopsies were acute inflammation without chronic changes.  Had a long conversation today with the patient.  Focal stricture, without additional inflammatory changes noted (single ulcer immediately proximal to the stricture was viewable and biopsied).  Discussed DDX which could include focal Crohn's disease.  Alternatively, could have a focal stricture from prior  significant inflammation (reportedly had severe giardiasis 20+ years ago).  Discussed medical management to include budesonide, and ultimately, he decided on conservative measures with plan as outlined below:     -Observe for return of index symptoms which would prompt expedited evaluation - ESR/CRP now to establish baseline - Held off trial of budesonide - Colonoscopy in 6 months - RTC if sxs prior to 6 month repeat  3) Colon  polyps -Colonoscopy with 4 tubular adenomas  4) Gastritis: -Complete course of PPI as prescribed -Otherwise did not have a history of reflux, so can complete PPI without need for ongoing acid suppression therapy  I spent 32 minutes of time, including in depth chart review, independent review of results as outlined above, communicating results with the patient directly, face-to-face time with the patient, coordinating care, and ordering studies and medications as appropriate, and documentation.        Lavena Bullion ,DO, FACG 11/06/2019, 8:47 AM

## 2019-11-13 ENCOUNTER — Other Ambulatory Visit (INDEPENDENT_AMBULATORY_CARE_PROVIDER_SITE_OTHER): Payer: Medicare Other

## 2019-11-13 DIAGNOSIS — Z86010 Personal history of colonic polyps: Secondary | ICD-10-CM

## 2019-11-13 DIAGNOSIS — K2970 Gastritis, unspecified, without bleeding: Secondary | ICD-10-CM | POA: Diagnosis not present

## 2019-11-13 DIAGNOSIS — K56699 Other intestinal obstruction unspecified as to partial versus complete obstruction: Secondary | ICD-10-CM | POA: Diagnosis not present

## 2019-11-13 LAB — SEDIMENTATION RATE: Sed Rate: 18 mm/hr (ref 0–20)

## 2019-11-13 LAB — C-REACTIVE PROTEIN: CRP: <1 mg/dL (ref 0.5–20.0)

## 2020-08-16 ENCOUNTER — Encounter: Payer: Self-pay | Admitting: Gastroenterology

## 2023-03-07 ENCOUNTER — Encounter (HOSPITAL_BASED_OUTPATIENT_CLINIC_OR_DEPARTMENT_OTHER): Payer: Self-pay

## 2023-03-07 ENCOUNTER — Other Ambulatory Visit (HOSPITAL_COMMUNITY): Payer: Self-pay

## 2023-03-07 ENCOUNTER — Other Ambulatory Visit: Payer: Self-pay

## 2023-03-07 ENCOUNTER — Emergency Department (HOSPITAL_BASED_OUTPATIENT_CLINIC_OR_DEPARTMENT_OTHER): Payer: Medicare Other

## 2023-03-07 ENCOUNTER — Inpatient Hospital Stay (HOSPITAL_BASED_OUTPATIENT_CLINIC_OR_DEPARTMENT_OTHER)
Admission: EM | Admit: 2023-03-07 | Discharge: 2023-03-12 | DRG: 308 | Disposition: A | Discharge status: Home / Self Care | Payer: Medicare Other | Attending: Family Medicine | Admitting: Family Medicine

## 2023-03-07 DIAGNOSIS — Z7982 Long term (current) use of aspirin: Secondary | ICD-10-CM | POA: Diagnosis not present

## 2023-03-07 DIAGNOSIS — I4891 Unspecified atrial fibrillation: Principal | ICD-10-CM | POA: Diagnosis present

## 2023-03-07 DIAGNOSIS — Z905 Acquired absence of kidney: Secondary | ICD-10-CM

## 2023-03-07 DIAGNOSIS — E538 Deficiency of other specified B group vitamins: Secondary | ICD-10-CM | POA: Diagnosis present

## 2023-03-07 DIAGNOSIS — J9601 Acute respiratory failure with hypoxia: Secondary | ICD-10-CM | POA: Diagnosis not present

## 2023-03-07 DIAGNOSIS — D649 Anemia, unspecified: Secondary | ICD-10-CM | POA: Diagnosis present

## 2023-03-07 DIAGNOSIS — E876 Hypokalemia: Secondary | ICD-10-CM | POA: Diagnosis present

## 2023-03-07 DIAGNOSIS — Z6826 Body mass index (BMI) 26.0-26.9, adult: Secondary | ICD-10-CM | POA: Diagnosis not present

## 2023-03-07 DIAGNOSIS — Z79899 Other long term (current) drug therapy: Secondary | ICD-10-CM

## 2023-03-07 DIAGNOSIS — I480 Paroxysmal atrial fibrillation: Secondary | ICD-10-CM | POA: Diagnosis present

## 2023-03-07 DIAGNOSIS — Z1152 Encounter for screening for COVID-19: Secondary | ICD-10-CM | POA: Diagnosis not present

## 2023-03-07 DIAGNOSIS — N400 Enlarged prostate without lower urinary tract symptoms: Secondary | ICD-10-CM | POA: Diagnosis present

## 2023-03-07 DIAGNOSIS — K219 Gastro-esophageal reflux disease without esophagitis: Secondary | ICD-10-CM | POA: Diagnosis present

## 2023-03-07 DIAGNOSIS — T501X5A Adverse effect of loop [high-ceiling] diuretics, initial encounter: Secondary | ICD-10-CM | POA: Diagnosis not present

## 2023-03-07 DIAGNOSIS — I5023 Acute on chronic systolic (congestive) heart failure: Secondary | ICD-10-CM | POA: Diagnosis not present

## 2023-03-07 DIAGNOSIS — E663 Overweight: Secondary | ICD-10-CM | POA: Diagnosis present

## 2023-03-07 DIAGNOSIS — Z87891 Personal history of nicotine dependence: Secondary | ICD-10-CM | POA: Diagnosis not present

## 2023-03-07 DIAGNOSIS — I509 Heart failure, unspecified: Secondary | ICD-10-CM | POA: Diagnosis not present

## 2023-03-07 DIAGNOSIS — J189 Pneumonia, unspecified organism: Secondary | ICD-10-CM

## 2023-03-07 DIAGNOSIS — N170 Acute kidney failure with tubular necrosis: Secondary | ICD-10-CM | POA: Diagnosis not present

## 2023-03-07 DIAGNOSIS — I340 Nonrheumatic mitral (valve) insufficiency: Secondary | ICD-10-CM | POA: Diagnosis not present

## 2023-03-07 DIAGNOSIS — I110 Hypertensive heart disease with heart failure: Secondary | ICD-10-CM | POA: Diagnosis not present

## 2023-03-07 DIAGNOSIS — I3139 Other pericardial effusion (noninflammatory): Secondary | ICD-10-CM | POA: Diagnosis not present

## 2023-03-07 DIAGNOSIS — I5020 Unspecified systolic (congestive) heart failure: Secondary | ICD-10-CM | POA: Diagnosis not present

## 2023-03-07 LAB — PROTIME-INR
INR: 1.3 — ABNORMAL HIGH (ref 0.8–1.2)
Prothrombin Time: 16 seconds — ABNORMAL HIGH (ref 11.4–15.2)

## 2023-03-07 LAB — SARS CORONAVIRUS 2 BY RT PCR: SARS Coronavirus 2 by RT PCR: NEGATIVE

## 2023-03-07 LAB — CBC
HCT: 35.4 % — ABNORMAL LOW (ref 39.0–52.0)
Hemoglobin: 11.6 g/dL — ABNORMAL LOW (ref 13.0–17.0)
MCH: 30.1 pg (ref 26.0–34.0)
MCHC: 32.8 g/dL (ref 30.0–36.0)
MCV: 91.9 fL (ref 80.0–100.0)
Platelets: 194 10*3/uL (ref 150–400)
RBC: 3.85 MIL/uL — ABNORMAL LOW (ref 4.22–5.81)
RDW: 13 % (ref 11.5–15.5)
WBC: 8.7 10*3/uL (ref 4.0–10.5)
nRBC: 0 % (ref 0.0–0.2)

## 2023-03-07 LAB — HEPATIC FUNCTION PANEL
ALT: 17 U/L (ref 0–44)
AST: 16 U/L (ref 15–41)
Albumin: 3.2 g/dL — ABNORMAL LOW (ref 3.5–5.0)
Alkaline Phosphatase: 83 U/L (ref 38–126)
Bilirubin, Direct: 0.4 mg/dL — ABNORMAL HIGH (ref 0.0–0.2)
Indirect Bilirubin: 3.3 mg/dL — ABNORMAL HIGH (ref 0.3–0.9)
Total Bilirubin: 3.7 mg/dL — ABNORMAL HIGH (ref 0.3–1.2)
Total Protein: 6.3 g/dL — ABNORMAL LOW (ref 6.5–8.1)

## 2023-03-07 LAB — MAGNESIUM: Magnesium: 1.8 mg/dL (ref 1.7–2.4)

## 2023-03-07 LAB — BASIC METABOLIC PANEL
Anion gap: 7 (ref 5–15)
BUN: 21 mg/dL (ref 8–23)
CO2: 24 mmol/L (ref 22–32)
Calcium: 7.8 mg/dL — ABNORMAL LOW (ref 8.9–10.3)
Chloride: 107 mmol/L (ref 98–111)
Creatinine, Ser: 1.05 mg/dL (ref 0.61–1.24)
GFR, Estimated: >60 mL/min (ref >60)
Glucose, Bld: 133 mg/dL — ABNORMAL HIGH (ref 70–99)
Potassium: 3.4 mmol/L — ABNORMAL LOW (ref 3.5–5.1)
Sodium: 138 mmol/L (ref 135–145)

## 2023-03-07 LAB — TSH: TSH: 1.786 u[IU]/mL (ref 0.350–4.500)

## 2023-03-07 LAB — BRAIN NATRIURETIC PEPTIDE: B Natriuretic Peptide: 859.5 pg/mL — ABNORMAL HIGH (ref 0.0–100.0)

## 2023-03-07 LAB — TROPONIN I (HIGH SENSITIVITY)
Troponin I (High Sensitivity): 9 ng/L (ref <18)
Troponin I (High Sensitivity): 9 ng/L (ref <18)

## 2023-03-07 LAB — APTT: aPTT: 37 seconds — ABNORMAL HIGH (ref 24–36)

## 2023-03-07 MED ORDER — ACETAMINOPHEN 325 MG PO TABS
650.0000 mg | ORAL_TABLET | Freq: Four times a day (QID) | ORAL | Status: DC | PRN
  Administered 2023-03-11: 650 mg via ORAL
  Filled 2023-03-07: qty 2

## 2023-03-07 MED ORDER — ACETAMINOPHEN 650 MG RE SUPP: 650.0000 mg | Freq: Four times a day (QID) | RECTAL | Status: DC | PRN

## 2023-03-07 MED ORDER — SODIUM CHLORIDE 0.9 % IV SOLN
500.0000 mg | INTRAVENOUS | Status: DC
  Administered 2023-03-07: 500 mg via INTRAVENOUS
  Filled 2023-03-07 (×2): qty 5

## 2023-03-07 MED ORDER — SODIUM CHLORIDE 0.9 % IV SOLN
1.0000 g | INTRAVENOUS | Status: DC
  Administered 2023-03-07: 1 g via INTRAVENOUS
  Filled 2023-03-07 (×2): qty 10

## 2023-03-07 MED ORDER — BACID PO TABS
2.0000 | ORAL_TABLET | Freq: Three times a day (TID) | ORAL | Status: DC
  Filled 2023-03-07: qty 2

## 2023-03-07 MED ORDER — SODIUM CHLORIDE 0.9 % IV BOLUS: 500.0000 mL | Freq: Once | INTRAVENOUS | Status: DC

## 2023-03-07 MED ORDER — SODIUM CHLORIDE 0.9 % IV BOLUS
500.0000 mL | Freq: Once | INTRAVENOUS | Status: AC
  Administered 2023-03-07: 500 mL via INTRAVENOUS

## 2023-03-07 MED ORDER — DILTIAZEM HCL 30 MG PO TABS
60.0000 mg | ORAL_TABLET | Freq: Once | ORAL | Status: AC
  Administered 2023-03-07: 60 mg via ORAL
  Filled 2023-03-07: qty 2

## 2023-03-07 MED ORDER — SODIUM CHLORIDE 0.9% FLUSH
3.0000 mL | Freq: Two times a day (BID) | INTRAVENOUS | Status: DC
  Administered 2023-03-07 – 2023-03-12 (×8): 3 mL via INTRAVENOUS

## 2023-03-07 MED ORDER — DILTIAZEM HCL-DEXTROSE 125-5 MG/125ML-% IV SOLN (PREMIX)
5.0000 mg/h | INTRAVENOUS | Status: DC
  Administered 2023-03-07: 5 mg/h via INTRAVENOUS
  Administered 2023-03-08: 12.5 mg/h via INTRAVENOUS
  Administered 2023-03-08 – 2023-03-09 (×3): 15 mg/h via INTRAVENOUS
  Filled 2023-03-07 (×6): qty 125

## 2023-03-07 MED ORDER — DILTIAZEM HCL 25 MG/5ML IV SOLN
20.0000 mg | Freq: Once | INTRAVENOUS | Status: AC
  Administered 2023-03-07: 20 mg via INTRAVENOUS
  Filled 2023-03-07: qty 5

## 2023-03-07 MED ORDER — MAGNESIUM SULFATE 2 GM/50ML IV SOLN
2.0000 g | Freq: Once | INTRAVENOUS | Status: AC
  Administered 2023-03-07: 2 g via INTRAVENOUS
  Filled 2023-03-07: qty 50

## 2023-03-07 MED ORDER — APIXABAN 5 MG PO TABS
5.0000 mg | ORAL_TABLET | Freq: Two times a day (BID) | ORAL | Status: DC
  Administered 2023-03-07 – 2023-03-12 (×11): 5 mg via ORAL
  Filled 2023-03-07 (×6): qty 1
  Filled 2023-03-07: qty 2
  Filled 2023-03-07 (×4): qty 1

## 2023-03-07 MED ORDER — POTASSIUM CHLORIDE CRYS ER 20 MEQ PO TBCR
40.0000 meq | EXTENDED_RELEASE_TABLET | Freq: Every day | ORAL | Status: AC
  Administered 2023-03-07: 40 meq via ORAL
  Filled 2023-03-07: qty 2

## 2023-03-07 MED ORDER — ASPIRIN 81 MG PO TBEC
81.0000 mg | DELAYED_RELEASE_TABLET | Freq: Every day | ORAL | Status: DC
  Administered 2023-03-08: 81 mg via ORAL
  Filled 2023-03-07: qty 1

## 2023-03-07 MED ORDER — POTASSIUM CHLORIDE CRYS ER 20 MEQ PO TBCR
40.0000 meq | EXTENDED_RELEASE_TABLET | Freq: Once | ORAL | Status: AC
  Administered 2023-03-07: 40 meq via ORAL
  Filled 2023-03-07: qty 2

## 2023-03-07 MED ORDER — RISAQUAD PO CAPS
2.0000 | ORAL_CAPSULE | Freq: Every day | ORAL | Status: DC
  Administered 2023-03-07 – 2023-03-12 (×5): 2 via ORAL
  Filled 2023-03-07 (×5): qty 2

## 2023-03-07 MED ORDER — POLYETHYLENE GLYCOL 3350 17 G PO PACK: 17.0000 g | PACK | Freq: Every day | ORAL | Status: DC | PRN

## 2023-03-07 NOTE — Assessment & Plan Note (Signed)
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

## 2023-03-07 NOTE — Progress Notes (Addendum)
Plan of Care Note for accepted transfer   Patient: Kevin Doyle MRN: [id number]   DOA: 03/07/2023  Facility requesting transfer: [facility].  Requesting Provider: Coralee Pesa, DO. Reason for transfer: Afib with RVR. Facility course:  72 year old male with a past medical history of BPH, small bowel obstruction, Gilbert's syndrome, history of paroxysmal atrial fibrillation in 2015 who presented to the emergency department complaints of palpitations with an irregular heart rate for the past week.  He had no issues after his first episode.  He has not followed with cardiology since.  He was given 500 mL of diltiazem 20 mg IVP x 1, then 60 mg p.o. x 1 and subsequently was started on a continuous infusion.  Admission requested to our service at [facility] to continue current treatment, further workup and evaluation by the cardiology team.  Message sent to Salley Hews to add to the cardiology rounding list.  Plan of care: The patient is accepted for admission to Progressive unit, at [facility].  Author: Bobette Mo, MD 03/07/2023  Check [email or web address] for on-call coverage.  Nursing staff, Please call [facility] Admits & Consults System-Wide number on Amion as soon as patient's arrival, so appropriate admitting provider can evaluate the pt.

## 2023-03-07 NOTE — H&P (Addendum)
History and Physical    Patient: Kevin Doyle ZOX:[id number] DOB: March 07, 1951 DOA: 03/07/2023 DOS: the patient was seen and examined on 03/07/2023 PCP: Patient, No Pcp Per  Patient coming from: Home  Chief Complaint:  Chief Complaint  Patient presents with   Irregular Heart Beat   HPI: Kevin Doyle is a 72 y.o. male with medical history significant of known paroxysmal atrial fibrillation.  Patient has noted himself to have irregular heart rate over last several days between 10 to 7 days.  Patient has been checking his pulse.  In spite of this patient reports very good exercise tolerance, he recently helped move his granddaughter into the dorm about a week ago.  However yesterday patient reports new onset of marked fatigue.  Anything he did "would wipe out " him.  Patient has also noticed new onset of cough that is dry for the last couple of days and last evening measured a temperature of 100.4 F at home.  There is no report of any sputum production no chest pain no shortness of breath.  No leg swelling no nausea vomiting diarrhea no sore throat no ear pain no neck pain.  There is a slight headache since this morning that had resolved.  Patient came to the ER today because of his symptoms as above new fatigue, and monitoring his heart rate at home revealed that his pulse rate was between 77 and 130 based on a couple of machines that he has.  Patient initially presented to outside ER where his pulse rate is documented as high as 167.  Patient has been started on diltiazem infusion.  Patient's heart rate currently is between 90 and 95 on the monitor.  Patient is currently asymptomatic.  Patient is seen in room 07/21/2003 at [facility] after transfer is accepted  by my colleague.  Patient is currently asymptomatic, accompanied by his wife  Review of Systems: As mentioned in the history of present illness. All other systems reviewed and are negative. Past Medical History:  Diagnosis Date    Atrial fibrillation (HCC)    last episode was 02/10/13   GERD (gastroesophageal reflux disease)    Gilbert's disease    Past Surgical History:  Procedure Laterality Date   CHOLECYSTECTOMY  05/18/2012   Procedure: LAPAROSCOPIC CHOLECYSTECTOMY WITH INTRAOPERATIVE CHOLANGIOGRAM;  Surgeon: Valarie Merino, MD;  Location: [facility];  Service: General;  Laterality: N/A;   COLONOSCOPY  10/08/2019   2005, 2010   ERCP  07/11/2012   Procedure: ENDOSCOPIC RETROGRADE CHOLANGIOPANCREATOGRAPHY (ERCP);  Surgeon: Louis Meckel, MD;  Location: [facility];  Service: Endoscopy;  Laterality: N/A;   Hernia repairs     x 3    INGUINAL HERNIA REPAIR     bilateral   LAPAROSCOPIC LYSIS OF ADHESIONS  06/24/2012   Procedure: LAPAROSCOPIC LYSIS OF ADHESIONS;  Surgeon: Crecencio Mc, MD;  Location: [facility];  Service: Urology;;   ROBOTIC ASSITED PARTIAL NEPHRECTOMY  06/24/2012   Procedure: ROBOTIC ASSITED PARTIAL NEPHRECTOMY;  Surgeon: Crecencio Mc, MD;  Location: [facility];  Service: Urology;  Laterality: Right;   UPPER GASTROINTESTINAL ENDOSCOPY  10/08/2019   2005,2010,2013   Social History:  reports that he has quit smoking. He has never used smokeless tobacco. He reports current alcohol use. He reports that he does not use drugs.  No Known Allergies  Family History  Problem Relation Age of Onset   Lung cancer Mother    Atrial fibrillation Mother    Brain cancer Father    Atrial  fibrillation Brother    Colon cancer Neg Hx    Rectal cancer Neg Hx    Stomach cancer Neg Hx    Esophageal cancer Neg Hx     Prior to Admission medications   Medication Sig Start Date End Date Taking? Authorizing Provider  Ascorbic Acid (VITAMIN C) 100 MG tablet Take 500 mg by mouth daily.   Yes [provider]  aspirin EC 81 MG tablet Take 81 mg by mouth daily. Swallow whole.   Yes [provider]  Coenzyme Q10 (COQ10) 100 MG CAPS Take 100 mg by mouth daily.    Yes [provider]  ELDERBERRY PO Take 1  tablet by mouth daily. Gummies   Yes [provider]  Multiple Vitamin (MULTIVITAMIN) tablet Take 1 tablet by mouth daily.   Yes [provider]  pantoprazole (PROTONIX) 40 MG tablet Take 1 tablet (40 mg total) by mouth 2 (two) times daily. Take for 8 weeks to promote mucosal healing. Patient not taking: Reported on 03/07/2023 10/08/19   Shellia Cleverly, DO    Physical Exam: Vitals:   03/07/23 1400 03/07/23 1415 03/07/23 1600 03/07/23 1825  BP: 116/76 124/74 113/72 120/77  Pulse: (!) 119 (!) 116 (!) 116 (!) 109  Resp: 18 (!) 21 20 20   Temp: 98.5 F (36.9 C)  98.5 F (36.9 C) 99.2 F (37.3 C)  TempSrc: Oral   Oral  SpO2: 95% 94% 93% 96%  Weight:      Height:       General: Patient in bed recumbent, does not appear to be in any distress, gives a fully coherent account of his symptoms Respiratory exam: There are bibasilar crackles less so in the interscapular area.  Otherwise no expiratory wheezes No respiratory distress, patient is on room air Abdomen all quadrants are soft nontender Extremities warm without edema no focal motor deficit Data Reviewed:  Labs on Admission:  Results for orders placed or performed during the hospital encounter of 03/07/23 (from the past 24 hour(s))  Basic metabolic panel     Status: Abnormal   Collection Time: 03/07/23 10:30 AM  Result Value Ref Range   Sodium 138 135 - 145 mmol/L   Potassium 3.4 (L) 3.5 - 5.1 mmol/L   Chloride 107 98 - 111 mmol/L   CO2 24 22 - 32 mmol/L   Glucose, Bld 133 (H) 70 - 99 mg/dL   BUN 21 8 - 23 mg/dL   Creatinine, Ser 9.81 0.61 - 1.24 mg/dL   Calcium 7.8 (L) 8.9 - 10.3 mg/dL   GFR, Estimated >19 >14 mL/min   Anion gap 7 5 - 15  CBC     Status: Abnormal   Collection Time: 03/07/23 10:30 AM  Result Value Ref Range   WBC 8.7 4.0 - 10.5 K/uL   RBC 3.85 (L) 4.22 - 5.81 MIL/uL   Hemoglobin 11.6 (L) 13.0 - 17.0 g/dL   HCT 78.2 (L) 95.6 - 21.3 %   MCV 91.9 80.0 - 100.0 fL   MCH 30.1 26.0 - 34.0 pg    MCHC 32.8 30.0 - 36.0 g/dL   RDW 08.6 57.8 - 46.9 %   Platelets 194 150 - 400 K/uL   nRBC 0.0 0.0 - 0.2 %  Protime-INR- (order if Patient is taking Coumadin / Warfarin)     Status: Abnormal   Collection Time: 03/07/23 10:30 AM  Result Value Ref Range   Prothrombin Time 16.0 (H) 11.4 - 15.2 seconds   INR 1.3 (H) 0.8 -  1.2  Troponin I (High Sensitivity)     Status: None   Collection Time: 03/07/23 10:30 AM  Result Value Ref Range   Troponin I (High Sensitivity) 9 <18 ng/L  Brain natriuretic peptide     Status: Abnormal   Collection Time: 03/07/23 10:30 AM  Result Value Ref Range   B Natriuretic Peptide 859.5 (H) 0.0 - 100.0 pg/mL  Magnesium     Status: None   Collection Time: 03/07/23 10:30 AM  Result Value Ref Range   Magnesium 1.8 1.7 - 2.4 mg/dL  TSH     Status: None   Collection Time: 03/07/23 10:30 AM  Result Value Ref Range   TSH 1.786 0.350 - 4.500 uIU/mL  Troponin I (High Sensitivity)     Status: None   Collection Time: 03/07/23 12:11 PM  Result Value Ref Range   Troponin I (High Sensitivity) 9 <18 ng/L  Hepatic function panel     Status: Abnormal   Collection Time: 03/07/23 12:11 PM  Result Value Ref Range   Total Protein 6.3 (L) 6.5 - 8.1 g/dL   Albumin 3.2 (L) 3.5 - 5.0 g/dL   AST 16 15 - 41 U/L   ALT 17 0 - 44 U/L   Alkaline Phosphatase 83 38 - 126 U/L   Total Bilirubin 3.7 (H) 0.3 - 1.2 mg/dL   Bilirubin, Direct 0.4 (H) 0.0 - 0.2 mg/dL   Indirect Bilirubin 3.3 (H) 0.3 - 0.9 mg/dL   Basic Metabolic Panel: Recent Labs  Lab 03/07/23 1030  NA 138  K 3.4*  CL 107  CO2 24  GLUCOSE 133*  BUN 21  CREATININE 1.05  CALCIUM 7.8*  MG 1.8   Liver Function Tests: Recent Labs  Lab 03/07/23 1211  AST 16  ALT 17  ALKPHOS 83  BILITOT 3.7*  PROT 6.3*  ALBUMIN 3.2*   No results for input(s): "LIPASE", "AMYLASE" in the last 168 hours. No results for input(s): "AMMONIA" in the last 168 hours. CBC: Recent Labs  Lab 03/07/23 1030  WBC 8.7  HGB 11.6*   HCT 35.4*  MCV 91.9  PLT 194   Cardiac Enzymes: Recent Labs  Lab 03/07/23 1030 03/07/23 1211  TROPONINIHS 9 9    BNP (last 3 results) No results for input(s): "PROBNP" in the last 8760 hours. CBG: No results for input(s): "GLUCAP" in the last 168 hours.  Radiological Exams on Admission:  DG Chest Port 1 View  Result Date: 03/07/2023 CLINICAL DATA:  Atrial fibrillation. EXAM: PORTABLE CHEST 1 VIEW COMPARISON:  X-ray 02/10/2013 FINDINGS: No consolidation, pneumothorax or effusion. No edema. Normal cardiopericardial silhouette. Overlapping cardiac leads hyperinflation. IMPRESSION: Hyperinflation.  No acute cardiopulmonary disease. Electronically Signed   By: Karen Kays M.D.   On: 03/07/2023 11:46    EKG: Independently reviewed. Initial EKG HR 171 narrow complexs. afib   Assessment and Plan: * Atrial fibrillation with RVR (HCC) Has chronically previously known paroxysmal atrial fibrillation.  Currently presenting in A-fib with RVR, rate now well-controlled will diltiazem infusion and 7.5 mg/h.  At this time I will await cardiology input to transition patient to oral rate control medication. reason is that this may be precipitated by pneumonia.  And therefore may get rate controlled on its own once patient's infection is controlled.  The other reason is that the patient is very interested in rhythm control, patient does not want to be in A-fib anymore.  Therefore we will wait for cardiology input in this regard.  TSH is within normal limit.  Patient has been started on Eliquis in the ER, this would be a good idea if patient cardioversion is contemplated.  Echocardiogram is pending  CAP (community acquired pneumonia) Diagnosis is based on patient's self-reported temperature of 100.4 F yesterday, dry cough and finding of crackles on exam.  At this time we will check for COVID-19, viral panel, blood cultures have been sent.  Treat with ceftriaxone and azithromycin.  Monitor clinically.   Patient had complaints of marked fatigue.  On exertion.  We will request ambulatory oximetry in the morning, as well as orthostatics  Normocytic anemia This seems to be chronic at least 72 years old, however patient does have indirect hyperbilirubinemia, therefore I will check anemia panel including haptoglobin and LDH and proceed from there      Advance Care Planning:   Code Status: Prior full code  Consults: cardio consult was called from the ER  Family Communication: wife at bedside. All questions answered.  Severity of Illness: The appropriate patient status for this patient is INPATIENT. Inpatient status is judged to be reasonable and necessary in order to provide the required intensity of service to ensure the patient's safety. The patient's presenting symptoms, physical exam findings, and initial radiographic and laboratory data in the context of their chronic comorbidities is felt to place them at high risk for further clinical deterioration. Furthermore, it is not anticipated that the patient will be medically stable for discharge from the hospital within 2 midnights of admission.   * I certify that at the point of admission it is my clinical judgment that the patient will require inpatient hospital care spanning beyond 2 midnights from the point of admission due to high intensity of service, high risk for further deterioration and high frequency of surveillance required.*  Author: Nolberto Hanlon, MD 03/07/2023 7:44 PM  For on call review [email or web address].

## 2023-03-07 NOTE — Assessment & Plan Note (Signed)
Diagnosis is based on patient's self-reported temperature of 100.4 F yesterday, dry cough and finding of crackles on exam.  At this time we will check for COVID-19, viral panel, blood cultures have been sent.  Treat with ceftriaxone and azithromycin.  Monitor clinically.  Patient had complaints of marked fatigue.  On exertion.  We will request ambulatory oximetry in the morning, as well as orthostatics

## 2023-03-07 NOTE — Assessment & Plan Note (Addendum)
Has chronically previously known paroxysmal atrial fibrillation.  Currently presenting in A-fib with RVR, rate now well-controlled will diltiazem infusion and 7.5 mg/h.  At this time I will await cardiology input to transition patient to oral rate control medication. reason is that this may be precipitated by pneumonia.  And therefore may get rate controlled on its own once patient's infection is controlled.  The other reason is that the patient is very interested in rhythm control, patient does not want to be in A-fib anymore.  Therefore we will wait for cardiology input in this regard.  TSH is within normal limit.  Patient has been started on Eliquis in the ER, this would be a good idea if patient cardioversion is contemplated.  Echocardiogram is pending

## 2023-03-07 NOTE — ED Triage Notes (Signed)
The patient has had an irregular heart beat for one week. HX of afib. No shortness of breath or chest pain.

## 2023-03-07 NOTE — ED Provider Notes (Signed)
[place] [facility] Provider Note   CSN: [id number] Arrival date & time: 03/07/23  1016     History  Chief Complaint  Patient presents with   Irregular Heart Beat    Kevin Doyle is a 72 y.o. male.  HPI   72 year old male with past medical history of previous episode of atrial fibrillation over 10 years ago that appears to have aborted spontaneously presents to the emergency department with irregular heartbeat for the last week.  Patient states 10 years ago he was evaluated in the ER, placed on medication and referred to cardiology.  However his heart rate returned to normal and he never followed up.  He is no longer on any medication.  Never on anticoagulation.  For the last week he has been feeling an irregular heartbeat.  He presents today with worsening fatigue and exertional shortness of breath.  Denies any chest pain.  No swelling of his lower extremities.  No history of DVT/PE or current risk factors.  He is otherwise been in his usual state of health prior to this episode of irregular heartbeat.  Home Medications Prior to Admission medications   Medication Sig Start Date End Date Taking? Authorizing Provider  Ascorbic Acid (VITAMIN C) 100 MG tablet Take 500 mg by mouth daily.    [provider]  Coenzyme Q10 (COQ10) 100 MG CAPS Take 100 mg by mouth daily.     [provider]  Multiple Vitamin (MULTIVITAMIN) tablet Take 1 tablet by mouth daily.    [provider]  pantoprazole (PROTONIX) 40 MG tablet Take 1 tablet (40 mg total) by mouth 2 (two) times daily. Take for 8 weeks to promote mucosal healing. 10/08/19   Cirigliano, Verlin Dike, DO      Allergies    Patient has no known allergies.    Review of Systems   Review of Systems  Constitutional:  Positive for fatigue. Negative for fever.  Respiratory:  Positive for shortness of breath. Negative for chest tightness.   Cardiovascular:  Positive for palpitations.  Negative for chest pain and leg swelling.  Gastrointestinal:  Negative for abdominal pain, diarrhea and vomiting.  Musculoskeletal:  Negative for back pain.  Skin:  Negative for rash.  Neurological:  Negative for headaches.    Physical Exam Updated Vital Signs BP 122/86   Pulse (!) 167   Temp 98.5 F (36.9 C) (Oral)   Resp 18   Ht 6\' 2"  (1.88 m)   Wt 94.4 kg   SpO2 98%   BMI 26.72 kg/m  Physical Exam Vitals and nursing note reviewed.  Constitutional:      General: He is not in acute distress.    Appearance: Normal appearance.  HENT:     Head: Normocephalic.     Mouth/Throat:     Mouth: Mucous membranes are moist.  Cardiovascular:     Rate and Rhythm: Tachycardia present. Rhythm irregular.  Pulmonary:     Effort: Pulmonary effort is normal. No respiratory distress.     Breath sounds: No wheezing.  Abdominal:     Palpations: Abdomen is soft.     Tenderness: There is no abdominal tenderness.  Musculoskeletal:        General: No swelling.  Skin:    General: Skin is warm.  Neurological:     Mental Status: He is alert and oriented to person, place, and time. Mental status is at baseline.  Psychiatric:        Mood and  Affect: Mood normal.     ED Results / Procedures / Treatments   Labs (all labs ordered are listed, but only abnormal results are displayed) Labs Reviewed  BASIC METABOLIC PANEL - Abnormal; Notable for the following components:      Result Value   Potassium 3.4 (*)    Glucose, Bld 133 (*)    Calcium 7.8 (*)    All other components within normal limits  CBC - Abnormal; Notable for the following components:   RBC 3.85 (*)    Hemoglobin 11.6 (*)    HCT 35.4 (*)    All other components within normal limits  PROTIME-INR - Abnormal; Notable for the following components:   Prothrombin Time 16.0 (*)    INR 1.3 (*)    All other components within normal limits  MAGNESIUM  BRAIN NATRIURETIC PEPTIDE  TSH  TROPONIN I (HIGH SENSITIVITY)     EKG None  Radiology No results found.  Procedures .Critical Care  Performed by: Rozelle Logan, DO Authorized by: Rozelle Logan, DO   Critical care provider statement:    Critical care time (minutes):  45   Critical care time was exclusive of:  Separately billable procedures and treating other patients   Critical care was necessary to treat or prevent imminent or life-threatening deterioration of the following conditions:  Circulatory failure and cardiac failure   Critical care was time spent personally by me on the following activities:  Development of treatment plan with patient or surrogate, discussions with consultants, evaluation of patient's response to treatment, examination of patient, ordering and review of laboratory studies, ordering and review of radiographic studies, ordering and performing treatments and interventions, pulse oximetry, re-evaluation of patient's condition and review of old charts   I assumed direction of critical care for this patient from another provider in my specialty: no     Care discussed with: admitting provider       Medications Ordered in ED Medications  sodium chloride 0.9 % bolus 500 mL (500 mLs Intravenous New Bag/Given 03/07/23 1109)  diltiazem (CARDIZEM) injection 20 mg (20 mg Intravenous Given 03/07/23 1106)    ED Course/ Medical Decision Making/ A&P                                 Medical Decision Making Amount and/or Complexity of Data Reviewed Labs: ordered. Radiology: ordered.  Risk Prescription drug management. Decision regarding hospitalization.   72 year old male presents emergency department with concern for irregular heart rate for the past week.  He is tachycardic on arrival, EKG shows atrial fibrillation with RVR.  About 10 years ago patient had an isolated an event of A-fib, was treated in the ER and discharged medication.  It appears that he spontaneously converted back to normal sinus rhythm and does not  follow with cardiology.  Was never on anticoagulation.  Blood work here is reassuring, troponin is negative x 2, BNP is elevated but no acute findings of CHF on chest x-ray.  No acute electrolyte abnormalities, TSH is pending.  After an IV dose of Cardizem heart rate improved.  We attempted to give p.o. dose for control however patient remained tachycardic into the 120s even at rest.  For this reason discussed transitioning the patient to a Cardizem infusion and anticoagulation with a goal to admit for cardiology evaluation and further treatment.  Patient and wife understand this admission plan.  Patients evaluation and results requires admission  for further treatment and care.  Spoke with hospitalist, reviewed patient's ED course and they accept admission.  Patient agrees with admission plan, offers no new complaints and is stable/unchanged at time of admit.        Final Clinical Impression(s) / ED Diagnoses Final diagnoses:  None    Rx / DC Orders ED Discharge Orders     None         Rozelle Logan, DO 03/07/23 1510

## 2023-03-08 ENCOUNTER — Inpatient Hospital Stay (HOSPITAL_COMMUNITY): Payer: Medicare Other

## 2023-03-08 DIAGNOSIS — I4891 Unspecified atrial fibrillation: Secondary | ICD-10-CM | POA: Diagnosis not present

## 2023-03-08 DIAGNOSIS — I480 Paroxysmal atrial fibrillation: Secondary | ICD-10-CM

## 2023-03-08 LAB — CBC
HCT: 34.8 % — ABNORMAL LOW (ref 39.0–52.0)
Hemoglobin: 11 g/dL — ABNORMAL LOW (ref 13.0–17.0)
MCH: 30.6 pg (ref 26.0–34.0)
MCHC: 31.6 g/dL (ref 30.0–36.0)
MCV: 96.9 fL (ref 80.0–100.0)
Platelets: 195 10*3/uL (ref 150–400)
RBC: 3.59 MIL/uL — ABNORMAL LOW (ref 4.22–5.81)
RDW: 13.1 % (ref 11.5–15.5)
WBC: 9.2 10*3/uL (ref 4.0–10.5)
nRBC: 0 % (ref 0.0–0.2)

## 2023-03-08 LAB — IRON AND TIBC
Iron: 23 ug/dL — ABNORMAL LOW (ref 45–182)
Saturation Ratios: 9 % — ABNORMAL LOW (ref 17.9–39.5)
TIBC: 266 ug/dL (ref 250–450)
UIBC: 243 ug/dL

## 2023-03-08 LAB — RESPIRATORY PANEL BY PCR

## 2023-03-08 LAB — APTT: aPTT: 38 s — ABNORMAL HIGH (ref 24–36)

## 2023-03-08 LAB — BASIC METABOLIC PANEL
Anion gap: 8 (ref 5–15)
BUN: 19 mg/dL (ref 8–23)
CO2: 19 mmol/L — ABNORMAL LOW (ref 22–32)
Calcium: 7.9 mg/dL — ABNORMAL LOW (ref 8.9–10.3)
Chloride: 109 mmol/L (ref 98–111)
Creatinine, Ser: 0.9 mg/dL (ref 0.61–1.24)
GFR, Estimated: >60 mL/min (ref >60)
Glucose, Bld: 120 mg/dL — ABNORMAL HIGH (ref 70–99)
Potassium: 4 mmol/L (ref 3.5–5.1)
Sodium: 136 mmol/L (ref 135–145)

## 2023-03-08 LAB — ECHOCARDIOGRAM COMPLETE
Area-P 1/2: 3.03 cm2
Calc EF: 48 %
Height: 74 in
S' Lateral: 4.4 cm
Single Plane A2C EF: 46.7 %
Single Plane A4C EF: 48.4 %
Weight: 3329.6 [oz_av]

## 2023-03-08 LAB — PROTIME-INR
INR: 1.5 — ABNORMAL HIGH (ref 0.8–1.2)
Prothrombin Time: 18.6 s — ABNORMAL HIGH (ref 11.4–15.2)

## 2023-03-08 LAB — RETICULOCYTES
Immature Retic Fract: 14.1 % (ref 2.3–15.9)
RBC.: 3.51 MIL/uL — ABNORMAL LOW (ref 4.22–5.81)
Retic Count, Absolute: 54.4 10*3/uL (ref 19.0–186.0)
Retic Ct Pct: 1.6 % (ref 0.4–3.1)

## 2023-03-08 LAB — FOLATE: Folate: 24.3 ng/mL (ref >5.9)

## 2023-03-08 LAB — VITAMIN B12: Vitamin B-12: 158 pg/mL — ABNORMAL LOW (ref 180–914)

## 2023-03-08 LAB — FERRITIN: Ferritin: 102 ng/mL (ref 24–336)

## 2023-03-08 LAB — PROCALCITONIN: Procalcitonin: <0.1 ng/mL

## 2023-03-08 LAB — LACTATE DEHYDROGENASE: LDH: 114 U/L (ref 98–192)

## 2023-03-08 MED ORDER — FUROSEMIDE 10 MG/ML IJ SOLN
40.0000 mg | Freq: Two times a day (BID) | INTRAMUSCULAR | Status: DC
  Administered 2023-03-08 – 2023-03-10 (×4): 40 mg via INTRAVENOUS
  Filled 2023-03-08 (×4): qty 4

## 2023-03-08 MED ORDER — SENNOSIDES-DOCUSATE SODIUM 8.6-50 MG PO TABS: 1.0000 | ORAL_TABLET | Freq: Every evening | ORAL | Status: DC | PRN

## 2023-03-08 MED ORDER — IPRATROPIUM-ALBUTEROL 0.5-2.5 (3) MG/3ML IN SOLN
3.0000 mL | RESPIRATORY_TRACT | Status: DC | PRN
  Filled 2023-03-08: qty 3

## 2023-03-08 MED ORDER — FUROSEMIDE 10 MG/ML IJ SOLN: 40.0000 mg | Freq: Once | INTRAMUSCULAR | Status: DC

## 2023-03-08 MED ORDER — FERROUS SULFATE 325 (65 FE) MG PO TABS
325.0000 mg | ORAL_TABLET | Freq: Every day | ORAL | Status: DC
  Administered 2023-03-10 – 2023-03-11 (×2): 325 mg via ORAL
  Filled 2023-03-08 (×3): qty 1

## 2023-03-08 MED ORDER — ORAL CARE MOUTH RINSE: 15.0000 mL | OROMUCOSAL | Status: DC | PRN

## 2023-03-08 MED ORDER — FUROSEMIDE 10 MG/ML IJ SOLN
40.0000 mg | Freq: Once | INTRAMUSCULAR | Status: AC
  Administered 2023-03-08: 40 mg via INTRAVENOUS
  Filled 2023-03-08: qty 4

## 2023-03-08 MED ORDER — HYDRALAZINE HCL 20 MG/ML IJ SOLN: 10.0000 mg | INTRAMUSCULAR | Status: DC | PRN

## 2023-03-08 MED ORDER — METOPROLOL TARTRATE 5 MG/5ML IV SOLN
5.0000 mg | INTRAVENOUS | Status: DC | PRN
  Administered 2023-03-08 – 2023-03-10 (×3): 5 mg via INTRAVENOUS
  Filled 2023-03-08 (×3): qty 5

## 2023-03-08 MED ORDER — METOPROLOL TARTRATE 25 MG PO TABS
25.0000 mg | ORAL_TABLET | Freq: Two times a day (BID) | ORAL | Status: DC
  Administered 2023-03-08: 25 mg via ORAL
  Filled 2023-03-08 (×2): qty 1

## 2023-03-08 MED ORDER — MELATONIN 5 MG PO TABS
5.0000 mg | ORAL_TABLET | Freq: Every evening | ORAL | Status: DC | PRN
  Administered 2023-03-08: 5 mg via ORAL
  Filled 2023-03-08: qty 1

## 2023-03-08 MED ORDER — DOCUSATE SODIUM 100 MG PO CAPS
100.0000 mg | ORAL_CAPSULE | Freq: Two times a day (BID) | ORAL | Status: DC
  Administered 2023-03-08 – 2023-03-12 (×8): 100 mg via ORAL
  Filled 2023-03-08 (×8): qty 1

## 2023-03-08 MED ORDER — VITAMIN B-12 100 MCG PO TABS
500.0000 ug | ORAL_TABLET | Freq: Every day | ORAL | Status: DC
  Administered 2023-03-08 – 2023-03-12 (×4): 500 ug via ORAL
  Filled 2023-03-08 (×3): qty 5
  Filled 2023-03-08: qty 1

## 2023-03-08 MED ORDER — ONDANSETRON HCL 4 MG/2ML IJ SOLN: 4.0000 mg | Freq: Four times a day (QID) | INTRAMUSCULAR | Status: DC | PRN

## 2023-03-08 NOTE — Progress Notes (Signed)
PROGRESS NOTE    Kevin Doyle  ZOX:[id number] DOB: May 02, 1951 DOA: 03/07/2023 PCP: Patient, No Pcp Per   Brief Narrative:  72 year old with history of paroxysmal A-fib, GERD comes to the hospital for evaluation of palpitations and fatigue.  In the ER patient was noted to be in atrial fibrillation with RVR therefore admitted to the hospital.   Assessment & Plan:  Principal Problem:   Atrial fibrillation with RVR (HCC) Active Problems:   Hypokalemia   Normocytic anemia   BPH (benign prostatic hyperplasia)   CAP (community acquired pneumonia)   A-fib (HCC)    Atrial fibrillation with RVR (HCC) Known history of paroxysmal atrial fibrillation not on any anticoagulation at home.  Admitting provider has consulted cardiology team.  Patient started on Eliquis upon admission.  Echocardiogram ordered.  TSH is normal.   Multifocal infiltrate I suspect this is volume overload.  Respiratory panel COVID-19 is negative.  BNP is elevated, procalcitonin negative.  I will discontinue antibiotics.  Lasix has been ordered. Bronchodilators.  I-S/flutter valve.  Vitamin B12 deficiency - Supplements ordered   Normocytic anemia Borderline low saturation and ferritin.  Hemoglobin overall stable at 11.  Will start iron supplements.    DVT prophylaxis: SCDs Start: 03/07/23 1956 apixaban (ELIQUIS) tablet 5 mg   Code Status: Full code Family Communication: Daughter on the phone Status is: Inpatient Remains inpatient appropriate because: Seen and examined at bedside, continue hospital stay for diuretics       Diet Orders (From admission, onward)     Start     Ordered   03/07/23 1957  Diet regular Room service appropriate? Yes; Fluid consistency: Thin  Diet effective now       Question Answer Comment  Room service appropriate? Yes   Fluid consistency: Thin      03/07/23 1957            Subjective: Seen and examined at bedside, some coughing this morning but certainly has  exertional dyspnea   Examination:  General exam: Appears calm and comfortable  Respiratory system: Diminished breath sounds bilaterally especially at bases Cardiovascular system: S1 & S2 heard, RRR. No JVD, murmurs, rubs, gallops or clicks. No pedal edema. Gastrointestinal system: Abdomen is nondistended, soft and nontender. No organomegaly or masses felt. Normal bowel sounds heard. Central nervous system: Alert and oriented. No focal neurological deficits. Extremities: Symmetric 5 x 5 power. Skin: No rashes, lesions or ulcers Psychiatry: Judgement and insight appear normal. Mood & affect appropriate.  Objective: Vitals:   03/08/23 0300 03/08/23 0400 03/08/23 0500 03/08/23 0600  BP: (!) 110/55 123/80    Pulse: (!) 118 (!) 119 (!) 109 (!) 113  Resp: (!) 31 (!) 23 (!) 22 (!) 23  Temp:      TempSrc:      SpO2: (!) 87% (!) 88% (!) 87% (!) 89%  Weight:      Height:        Intake/Output Summary (Last 24 hours) at 03/08/2023 0838 Last data filed at 03/08/2023 0600 Gross per 24 hour  Intake 1211.86 ml  Output 250 ml  Net 961.86 ml   Filed Weights   03/07/23 1027  Weight: 94.4 kg    Scheduled Meds:  acidophilus  2 capsule Oral Daily   apixaban  5 mg Oral BID   aspirin EC  81 mg Oral Daily   sodium chloride flush  3 mL Intravenous Q12H   Continuous Infusions:  azithromycin Stopped (03/08/23 0030)   cefTRIAXone (ROCEPHIN)  IV Stopped (03/07/23  2230)   diltiazem (CARDIZEM) infusion 12.5 mg/hr (03/08/23 0351)    Nutritional status     Body mass index is 26.72 kg/m.  Data Reviewed:   CBC: Recent Labs  Lab 03/07/23 1030 03/08/23 0505  WBC 8.7 9.2  HGB 11.6* 11.0*  HCT 35.4* 34.8*  MCV 91.9 96.9  PLT 194 195   Basic Metabolic Panel: Recent Labs  Lab 03/07/23 1030 03/08/23 0505  NA 138 136  K 3.4* 4.0  CL 107 109  CO2 24 19*  GLUCOSE 133* 120*  BUN 21 19  CREATININE 1.05 0.90  CALCIUM 7.8* 7.9*  MG 1.8  --    GFR: Estimated Creatinine Clearance: 87.5  mL/min (by C-G formula based on SCr of 0.9 mg/dL). Liver Function Tests: Recent Labs  Lab 03/07/23 1211  AST 16  ALT 17  ALKPHOS 83  BILITOT 3.7*  PROT 6.3*  ALBUMIN 3.2*   No results for input(s): "LIPASE", "AMYLASE" in the last 168 hours. No results for input(s): "AMMONIA" in the last 168 hours. Coagulation Profile: Recent Labs  Lab 03/07/23 1030 03/08/23 0505  INR 1.3* 1.5*   Cardiac Enzymes: No results for input(s): "CKTOTAL", "CKMB", "CKMBINDEX", "TROPONINI" in the last 168 hours. BNP (last 3 results) No results for input(s): "PROBNP" in the last 8760 hours. HbA1C: No results for input(s): "HGBA1C" in the last 72 hours. CBG: No results for input(s): "GLUCAP" in the last 168 hours. Lipid Profile: No results for input(s): "CHOL", "HDL", "LDLCALC", "TRIG", "CHOLHDL", "LDLDIRECT" in the last 72 hours. Thyroid Function Tests: Recent Labs    03/07/23 1030  TSH 1.786   Anemia Panel: Recent Labs    03/08/23 0505  VITAMINB12 158*  FOLATE 24.3  FERRITIN 102  TIBC 266  IRON 23*  RETICCTPCT 1.6   Sepsis Labs: No results for input(s): "PROCALCITON", "LATICACIDVEN" in the last 168 hours.  Recent Results (from the past 240 hour(s))  Respiratory (~20 pathogens) panel by PCR     Status: None   Collection Time: 03/07/23  7:23 PM   Specimen: Nasopharyngeal Swab; Respiratory  Result Value Ref Range Status   Adenovirus NOT DETECTED NOT DETECTED Final   Coronavirus 229E NOT DETECTED NOT DETECTED Final    Comment: (NOTE) The Coronavirus on the Respiratory Panel, DOES NOT test for the novel  Coronavirus (2019 nCoV)    Coronavirus HKU1 NOT DETECTED NOT DETECTED Final   Coronavirus NL63 NOT DETECTED NOT DETECTED Final   Coronavirus OC43 NOT DETECTED NOT DETECTED Final   Metapneumovirus NOT DETECTED NOT DETECTED Final   Rhinovirus / Enterovirus NOT DETECTED NOT DETECTED Final   Influenza A NOT DETECTED NOT DETECTED Final   Influenza B NOT DETECTED NOT DETECTED Final    Parainfluenza Virus 1 NOT DETECTED NOT DETECTED Final   Parainfluenza Virus 2 NOT DETECTED NOT DETECTED Final   Parainfluenza Virus 3 NOT DETECTED NOT DETECTED Final   Parainfluenza Virus 4 NOT DETECTED NOT DETECTED Final   Respiratory Syncytial Virus NOT DETECTED NOT DETECTED Final   Bordetella pertussis NOT DETECTED NOT DETECTED Final   Bordetella Parapertussis NOT DETECTED NOT DETECTED Final   Chlamydophila pneumoniae NOT DETECTED NOT DETECTED Final   Mycoplasma pneumoniae NOT DETECTED NOT DETECTED Final    Comment: Performed at [facility], [address], [place], [place] [address]  SARS Coronavirus 2 by RT PCR (hospital order, performed in [facility] hospital lab) *cepheid single result test* Anterior Nasal Swab     Status: None   Collection Time: 03/07/23  7:23 PM   Specimen: Anterior Nasal Swab  Result Value Ref Range Status   SARS Coronavirus 2 by RT PCR NEGATIVE NEGATIVE Final    Comment: (NOTE) SARS-CoV-2 target nucleic acids are NOT DETECTED.  The SARS-CoV-2 RNA is generally detectable in upper and lower respiratory specimens during the acute phase of infection. The lowest concentration of SARS-CoV-2 viral copies this assay can detect is 250 copies / mL. A negative result does not preclude SARS-CoV-2 infection and should not be used as the sole basis for treatment or other patient management decisions.  A negative result may occur with improper specimen collection / handling, submission of specimen other than nasopharyngeal swab, presence of viral mutation(s) within the areas targeted by this assay, and inadequate number of viral copies (<250 copies / mL). A negative result must be combined with clinical observations, patient history, and epidemiological information.  Fact Sheet for Patients:   RoadLapTop.co.za  Fact Sheet for Healthcare Providers: http://kim-miller.com/  This test is not yet approved or  cleared by  the [place] FDA and has been authorized for detection and/or diagnosis of SARS-CoV-2 by FDA under an Emergency Use Authorization (EUA).  This EUA will remain in effect (meaning this test can be used) for the duration of the COVID-19 declaration under Section 564(b)(1) of the Act, 21 U.S.C. section 360bbb-3(b)(1), unless the authorization is terminated or revoked sooner.  Performed at [facility], [address] [place], [place] [address]   Culture, blood (Routine X 2) w Reflex to ID Panel     Status: None (Preliminary result)   Collection Time: 03/07/23  9:23 PM   Specimen: BLOOD RIGHT ARM  Result Value Ref Range Status   Specimen Description   Final    BLOOD RIGHT ARM Performed at [facility], [address] [place], [place] [address]    Special Requests   Final    BOTTLES DRAWN AEROBIC AND ANAEROBIC Blood Culture results may not be optimal due to an inadequate volume of blood received in culture bottles Performed at [facility], [address] [place], [place] [address]    Culture   Final    NO GROWTH < 12 HOURS Performed at [facility], [address] [place], [place] [address]    Report Status PENDING  Incomplete  Culture, blood (Routine X 2) w Reflex to ID Panel     Status: None (Preliminary result)   Collection Time: 03/07/23  9:23 PM   Specimen: BLOOD LEFT HAND  Result Value Ref Range Status   Specimen Description   Final    BLOOD LEFT HAND Performed at [facility], [address] [place], [place] [address]    Special Requests   Final    BOTTLES DRAWN AEROBIC AND ANAEROBIC Blood Culture results may not be optimal due to an inadequate volume of blood received in culture bottles Performed at [facility], [address], [place], [place] [address]    Culture   Final    NO GROWTH < 12 HOURS Performed at [facility], [address], [place], [place] [address]    Report  Status PENDING  Incomplete         Radiology Studies: DG Chest Port 1 View  Result Date: 03/07/2023 CLINICAL DATA:  Atrial fibrillation. EXAM: PORTABLE CHEST 1 VIEW COMPARISON:  X-ray 02/10/2013 FINDINGS: No consolidation, pneumothorax or effusion. No edema. Normal cardiopericardial silhouette. Overlapping cardiac leads hyperinflation. IMPRESSION: Hyperinflation.  No acute cardiopulmonary disease. Electronically Signed   By: Karen Kays M.D.   On: 03/07/2023 11:46           LOS: 1 day   Time spent= 35 mins    Miguel Rota, MD [facility]  If 7PM-7AM, please contact night-coverage  03/08/2023, 8:38 AM

## 2023-03-08 NOTE — Plan of Care (Signed)
  Problem: Education: Goal: Knowledge of General Education information will improve Description: Including pain rating scale, medication(s)/side effects and non-pharmacologic comfort measures Outcome: Progressing   Problem: Health Behavior/Discharge Planning: Goal: Ability to manage health-related needs will improve Outcome: Progressing   Problem: Clinical Measurements: Goal: Diagnostic test results will improve Outcome: Progressing Goal: Respiratory complications will improve Outcome: Progressing Goal: Cardiovascular complication will be avoided Outcome: Progressing   Problem: Activity: Goal: Risk for activity intolerance will decrease Outcome: Progressing   Problem: Nutrition: Goal: Adequate nutrition will be maintained Outcome: Progressing   Problem: Coping: Goal: Level of anxiety will decrease Outcome: Progressing   Problem: Elimination: Goal: Will not experience complications related to bowel motility Outcome: Progressing Goal: Will not experience complications related to urinary retention Outcome: Progressing   Problem: Pain Managment: Goal: General experience of comfort will improve Outcome: Progressing   Problem: Safety: Goal: Ability to remain free from injury will improve Outcome: Progressing   Problem: Skin Integrity: Goal: Risk for impaired skin integrity will decrease Outcome: Progressing   Problem: Education: Goal: Knowledge of disease or condition will improve Outcome: Progressing Goal: Understanding of medication regimen will improve Outcome: Progressing Goal: Individualized Educational Video(s) Outcome: Progressing   Problem: Activity: Goal: Ability to tolerate increased activity will improve Outcome: Progressing   Problem: Cardiac: Goal: Ability to achieve and maintain adequate cardiopulmonary perfusion will improve Outcome: Progressing   Problem: Health Behavior/Discharge Planning: Goal: Ability to safely manage health-related needs  after discharge will improve Outcome: Progressing   Problem: Clinical Measurements: Goal: Ability to maintain clinical measurements within normal limits will improve Outcome: Not Progressing Pt is currently on a drip for Afib RVR and heart rate is elevated. Pt is running a low grade fever. Goal: Will remain free from infection Outcome: Not Progressing

## 2023-03-08 NOTE — Progress Notes (Signed)
   03/08/23 0842  Assess: MEWS Score  Temp 98.6 F (37 C)  BP (!) 133/91  MAP (mmHg) 104  Pulse Rate (!) 130  ECG Heart Rate (!) 137  Resp 19  Level of Consciousness Alert  SpO2 94 %  O2 Device Room Air  Assess: MEWS Score  MEWS Temp 0  MEWS Systolic 0  MEWS Pulse 3  MEWS RR 0  MEWS LOC 0  MEWS Score 3  MEWS Score Color Yellow  Assess: if the MEWS score is Yellow or Red  Were vital signs accurate and taken at a resting state? Yes  Does the patient meet 2 or more of the SIRS criteria? No  MEWS guidelines implemented  No, previously yellow, continue vital signs every 4 hours  Notify: Charge Nurse/RN  Name of Charge Nurse/RN Notified J Huff RN  Assess: SIRS CRITERIA  SIRS Temperature  0  SIRS Pulse 1  SIRS Respirations  0  SIRS WBC 0  SIRS Score Sum  1

## 2023-03-08 NOTE — Progress Notes (Signed)
MEWS Progress Note  Patient Details Name: Kevin Doyle MRN: [id number] DOB: Dec 30, 1950 Today's Date: 03/08/2023   MEWS Flowsheet Documentation:  Assess: MEWS Score Temp: 99.7 F (37.6 C) BP: 112/65 MAP (mmHg): 80 Pulse Rate: (!) 117 ECG Heart Rate: (!) 112 Resp: (!) 26 Level of Consciousness: Alert SpO2: 92 % O2 Device: Room Air Assess: MEWS Score MEWS Temp: 0 MEWS Systolic: 0 MEWS Pulse: 2 MEWS RR: 2 MEWS LOC: 0 MEWS Score: 4 MEWS Score Color: Red Assess: SIRS CRITERIA SIRS Temperature : 0 SIRS Respirations : 1 SIRS Pulse: 1 SIRS WBC: 0 SIRS Score Sum : 2 SIRS Temperature : 0 SIRS Pulse: 1 SIRS Respirations : 1 SIRS WBC: 0 SIRS Score Sum : 2        Myrla Halsted 03/08/2023, 12:58 AM

## 2023-03-08 NOTE — Consult Note (Addendum)
Cardiology Consultation   Patient ID: Kevin Doyle MRN: [id number]; DOB: 06-02-1951  Admit date: 03/07/2023 Date of Consult: 03/08/2023  PCP:  Patient, No Pcp Per   [facility] Providers Cardiologist:  Rollene Rotunda, MD - was seen by Dr. Antoine Poche in 2015, so he is a new patient this admission    Patient Profile:   Kevin Doyle is a 72 y.o. male with a hx of atrial fibrillation, GERD, Gilbert's disease,  who is being seen 03/08/2023 for the evaluation of atrial fibrillation at the request of Dr. Nelson Chimes.  History of Present Illness:   Kevin Doyle is a 72 year old male with above medical history. Per chart review, patient saw Dr. Antoine Poche one time in 11/2020 for evaluation of atrial fibrillation. At that time, patient reported that he had a lone episode of atrial fibrillation in the setting of vertigo. He did not have recurrence of afib, so he was not started on any new medications or anticoagulation at that time. He has not been seen by cardiology since that time  Patient presented to the ED on 03/07/23 concerned that his HR had been irregular for the past week. He also complained of fatigue, cough, and temperature of 100.80F at home. In the ED, initial vital signs showed HR 167 BPM, respirations 18 breaths per minute, BP 122/86, oxygen 98% on room air. Initial EKG showed atrial fibrillation with HR 171 BPM. Labs significant for K 3.4, creatinine 1.05, mag 1.8, albumin 3.2, WBC 8.7, hemoglobin 11.6, platelets 194. TSH normal. hsTn 9, 9. BNP 859. CXR showed hyperinflation without acute cardiopulmonary disease. Respiratory panel and COVID negative. He was started on cardizem infusion and eliquis.    On interview, patient reports that about a week ago he started to feel like his heart rate was a little irregular.  However, he still felt okay physically.  He did not have shortness of breath, fatigue, dyspnea on exertion.  He was able to help move his granddaughter into her college dorm  without difficulty.  Yesterday, he started to feel more fatigued and noticed that he started to have a dry cough so he decided to come to the ED for evaluation.  Now he is on Cardizem infusion and notes that he does feel a bit better.  He reports that he could not sleep well last night because he had shortness of breath when trying to lay flat on his back.  He also continues to have a persistent, dry cough.  Denies shortness of breath when at rest, ankle edema, abdominal distention, chest pain.  He reports that in the past, he did have an episode of atrial fibrillation in 2014 in the setting of severe vomiting.  Since then, he cannot recall having any recurrence of A-fib.  Believes that if he did have an episode of A-fib, it was so quick that he could not feel it.  He would like to avoid being on blood thinners long-term because he often gets nosebleeds.  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 daughter is a Engineer, civil (consulting), so he does have basic understanding of atrial fibrillation   Past Medical History:  Diagnosis Date   Atrial fibrillation (HCC)    last episode was 02/10/13   GERD (gastroesophageal reflux disease)    Gilbert's disease     Past Surgical History:  Procedure Laterality Date   CHOLECYSTECTOMY  05/18/2012   Procedure: LAPAROSCOPIC CHOLECYSTECTOMY WITH INTRAOPERATIVE CHOLANGIOGRAM;  Surgeon: Valarie Merino, MD;  Location: [facility];  Service: General;  Laterality: N/A;   COLONOSCOPY  10/08/2019   2005, 2010   ERCP  07/11/2012   Procedure: ENDOSCOPIC RETROGRADE CHOLANGIOPANCREATOGRAPHY (ERCP);  Surgeon: Louis Meckel, MD;  Location: [facility];  Service: Endoscopy;  Laterality: N/A;   Hernia repairs     x 3    INGUINAL HERNIA REPAIR     bilateral   LAPAROSCOPIC LYSIS OF ADHESIONS  06/24/2012   Procedure: LAPAROSCOPIC LYSIS OF ADHESIONS;  Surgeon: Crecencio Mc, MD;  Location: [facility];  Service: Urology;;   ROBOTIC ASSITED PARTIAL NEPHRECTOMY  06/24/2012   Procedure: ROBOTIC ASSITED PARTIAL NEPHRECTOMY;   Surgeon: Crecencio Mc, MD;  Location: [facility];  Service: Urology;  Laterality: Right;   UPPER GASTROINTESTINAL ENDOSCOPY  10/08/2019   2005,2010,2013     Home Medications:  Prior to Admission medications   Medication Sig Start Date End Date Taking? Authorizing Provider  Ascorbic Acid (VITAMIN C) 100 MG tablet Take 500 mg by mouth daily.   Yes [provider]  aspirin EC 81 MG tablet Take 81 mg by mouth daily. Swallow whole.   Yes [provider]  Coenzyme Q10 (COQ10) 100 MG CAPS Take 100 mg by mouth daily.    Yes [provider]  ELDERBERRY PO Take 1 tablet by mouth daily. Gummies   Yes [provider]  Multiple Vitamin (MULTIVITAMIN) tablet Take 1 tablet by mouth daily.   Yes [provider]  pantoprazole (PROTONIX) 40 MG tablet Take 1 tablet (40 mg total) by mouth 2 (two) times daily. Take for 8 weeks to promote mucosal healing. Patient not taking: Reported on 03/07/2023 10/08/19   Shellia Cleverly, DO    Inpatient Medications: Scheduled Meds:  acidophilus  2 capsule Oral Daily   apixaban  5 mg Oral BID   aspirin EC  81 mg Oral Daily   vitamin B-12  500 mcg Oral Daily   docusate sodium  100 mg Oral BID   [START ON 03/09/2023] ferrous sulfate  325 mg Oral Q breakfast   furosemide  40 mg Intravenous Once   metoprolol tartrate  25 mg Oral BID   sodium chloride flush  3 mL Intravenous Q12H   Continuous Infusions:  azithromycin Stopped (03/08/23 0030)   cefTRIAXone (ROCEPHIN)  IV Stopped (03/07/23 2230)   diltiazem (CARDIZEM) infusion 15 mg/hr (03/08/23 0843)   PRN Meds: acetaminophen **OR** acetaminophen, hydrALAZINE, ipratropium-albuterol, metoprolol tartrate, ondansetron (ZOFRAN) IV, mouth rinse, polyethylene glycol, senna-docusate  Allergies:   No Known Allergies  Social History:   Social History   Socioeconomic History   Marital status: Married    Spouse name: Not on file   Number of children: Not on file   Years of education:  Not on file   Highest education level: Not on file  Occupational History   Not on file  Tobacco Use   Smoking status: Former   Smokeless tobacco: Never   Tobacco comments:    Quit at age 69   Vaping Use   Vaping status: Never Used  Substance and Sexual Activity   Alcohol use: Yes    Comment: Rare-Beer    Drug use: No   Sexual activity: Not on file  Other Topics Concern   Not on file  Social History Narrative   Married.  Independent of ADLs.  2 cups of coffee daily    Social Determinants of Health   Financial Resource Strain: Not on file  Food Insecurity: No Food Insecurity (03/07/2023)   Hunger Vital Sign  Worried About Programme researcher, broadcasting/film/video in the Last Year: Never true    Ran Out of Food in the Last Year: Never true  Transportation Needs: No Transportation Needs (03/07/2023)   PRAPARE - Administrator, Civil Service (Medical): No    Lack of Transportation (Non-Medical): No  Physical Activity: Not on file  Stress: Not on file  Social Connections: Unknown (11/29/2021)   Received from [facility]   Social Network    Social Network: Not on file  Intimate Partner Violence: Not At Risk (03/07/2023)   Humiliation, Afraid, Rape, and Kick questionnaire    Fear of Current or Ex-Partner: No    Emotionally Abused: No    Physically Abused: No    Sexually Abused: No    Family History:    Family History  Problem Relation Age of Onset   Lung cancer Mother    Atrial fibrillation Mother    Brain cancer Father    Atrial fibrillation Brother    Colon cancer Neg Hx    Rectal cancer Neg Hx    Stomach cancer Neg Hx    Esophageal cancer Neg Hx      ROS:  Please see the history of present illness.   All other ROS reviewed and negative.     Physical Exam/Data:   Vitals:   03/08/23 0400 03/08/23 0500 03/08/23 0600 03/08/23 0842  BP: 123/80   (!) 133/91  Pulse: (!) 119 (!) 109 (!) 113 (!) 130  Resp: (!) 23 (!) 22 (!) 23 19  Temp:    98.6 F (37 C)  TempSrc:     Oral  SpO2: (!) 88% (!) 87% (!) 89% 94%  Weight:      Height:        Intake/Output Summary (Last 24 hours) at 03/08/2023 0933 Last data filed at 03/08/2023 0600 Gross per 24 hour  Intake 1211.86 ml  Output 250 ml  Net 961.86 ml      03/07/2023   10:27 AM 11/06/2019    8:42 AM 10/08/2019    8:19 AM  Last 3 Weights  Weight (lbs) 208 lb 1.6 oz 194 lb 6 oz 193 lb  Weight (kg) 94.394 kg 88.168 kg 87.544 kg     Body mass index is 26.72 kg/m.  General:  Well nourished, well developed, in no acute distress. Sitting comfortably on the side of the bed eating breakfast  HEENT: normal Neck: no JVD Vascular: Radial pulses 2+ bilaterally Cardiac:  normal S1, S2; Irregular rate and rhythm. Tachycardic. No murmurs  Lungs:  crackles in bilateral lung bases. Normal work of breathing on room air  Abd: soft, nontender Ext: no edema in BLE  Musculoskeletal:  No deformities, BUE and BLE strength normal and equal Skin: warm and dry  Neuro:  CNs 2-12 intact, no focal abnormalities noted Psych:  Normal affect   EKG:  The EKG was personally reviewed and demonstrates:  Atrial fibrillation with HR 171BPM  Telemetry:  Telemetry was personally reviewed and demonstrates:  Atrial fibrillation, HR in the 110s-130s  Relevant CV Studies:   Laboratory Data:  High Sensitivity Troponin:   Recent Labs  Lab 03/07/23 1030 03/07/23 1211  TROPONINIHS 9 9     Chemistry Recent Labs  Lab 03/07/23 1030 03/08/23 0505  NA 138 136  K 3.4* 4.0  CL 107 109  CO2 24 19*  GLUCOSE 133* 120*  BUN 21 19  CREATININE 1.05 0.90  CALCIUM 7.8* 7.9*  MG 1.8  --  GFRNONAA >60 >60  ANIONGAP 7 8    Recent Labs  Lab 03/07/23 1211  PROT 6.3*  ALBUMIN 3.2*  AST 16  ALT 17  ALKPHOS 83  BILITOT 3.7*   Lipids No results for input(s): "CHOL", "TRIG", "HDL", "LABVLDL", "LDLCALC", "CHOLHDL" in the last 168 hours.  Hematology Recent Labs  Lab 03/07/23 1030 03/08/23 0505  WBC 8.7 9.2  RBC 3.85* 3.59*  3.51*   HGB 11.6* 11.0*  HCT 35.4* 34.8*  MCV 91.9 96.9  MCH 30.1 30.6  MCHC 32.8 31.6  RDW 13.0 13.1  PLT 194 195   Thyroid  Recent Labs  Lab 03/07/23 1030  TSH 1.786    BNP Recent Labs  Lab 03/07/23 1030  BNP 859.5*    DDimer No results for input(s): "DDIMER" in the last 168 hours.   Radiology/Studies:  DG Chest Port 1 View  Result Date: 03/07/2023 CLINICAL DATA:  Atrial fibrillation. EXAM: PORTABLE CHEST 1 VIEW COMPARISON:  X-ray 02/10/2013 FINDINGS: No consolidation, pneumothorax or effusion. No edema. Normal cardiopericardial silhouette. Overlapping cardiac leads hyperinflation. IMPRESSION: Hyperinflation.  No acute cardiopulmonary disease. Electronically Signed   By: Karen Kays M.D.   On: 03/07/2023 11:46     Assessment and Plan:   Paroxysmal atrial fibrillation - Patient previously did have an episode of atrial fibrillation in 2014 in the setting of vertigo/severe vomiting.  He did not have recurrence of A-fib so was not started on anticoagulation or rate controlling medications at that time. - Now patient presenting complaining of an irregular heartbeat, fatigue, dry cough.  Found to be in atrial fibrillation with heart rates up to the 170s in the ER.  TSH normal.  BNP elevated to 859.5.  Respiratory panel and COVID-negative - On Cardizem infusion--heart rate has improved but remains in the 110s-130s this am.  BP stable - Start metoprolol 25 mg twice daily - titrate as BP allows  - Continue to monitor heart rate on telemetry.  If heart rate is well-controlled with addition of metoprolol, may be able to discharge with plans for outpatient cardioversion.  If heart rates remain elevated or patient remains symptomatic when in rate-controlled A-fib, may need TEE guided DCCV this admission. He is on eliquis and has received 3 doses so far  - Continue Eliquis 5 mg twice daily-CHA2DS2-VASc 1 (age), so patient may not need to be on long-term anticoagulation. In fact, he would prefer  to avoid long term AC due to frequent nose bleeds. Continue Eliquis for now in case patient requires cardioversion - Echocardiogram pending  Orthopnea Cough Elevated BNP -Patient presented complaining of a dry cough that had been persistent.  Today, he also mentioned having orthopnea -BNP elevated to 859.5 on presentation -Patient does have crackles in bilateral lung bases on exam this morning- likely has some fluid in lungs due to sustained RVR  -Give one-time dose of Lasix 40 mg today.  Monitor urine output and symptoms  -Echocardiogram pending  Risk Assessment/Risk Scores:    CHA2DS2-VASc Score = 1  This indicates a 0.6% annual risk of stroke. The patient's score is based upon: CHF History: 0 HTN History: 0 Diabetes History: 0 Stroke History: 0 Vascular Disease History: 0 Age Score: 1 Gender Score: 0      For questions or updates, please contact [facility] Please consult [email or web address] for contact info under    Signed, Dodger Shy, PA-C  03/08/2023 9:33 AM  History and all data above reviewed.  Patient examined.  I agree with the  findings as above.   The patient presents with about a week or more hours worth of not feeling well.  He felt like he was out of rhythm as he has done this in the distant past.  He been well up until about 8 days ago.  He started feeling weak.  He feels his heart skipping and racing.  There was 1 episode of his heart rate being 130s but for the most part he thinks it has been controlled.  Continue blood pressure is pulse on a pulse ox occasionally.  He just has had decreased exercise tolerance.  He had some shortness of breath in the last couple of days.  His sister-in-law who is a nurse took his pulse and thought he was in A-fib and so he came to the emergency room.  Prior to this he not been having any chest discomfort, neck or arm discomfort.  Not been having any presyncope or syncope.  He not been having any PND orthopnea.  Has had  no weight gain or edema.  He has had low-grade temp over 2 nights.  The patient exam reveals COR: Irregular no murmur, JVD at 45 degrees 8 mm with HJR   Lungs: Decreased breath sounds  ,  Abd: Positive bowel sounds, no rebound no guarding, Ext No edema  .  All available labs, radiology testing, previous records reviewed. Agree with documented assessment and plan.   Atrial fib:  Possibly a week or longer but clearly cannot say this was less than 48 hours.  For now we will use diltiazem for rate control.  I do suspect that his reduced ejection fraction with an echo is pending.  Agree with a DOAC.  My preference would be 3 weeks of anticoagulation followed by cardioversion although if he remains very symptomatic we might need TEE cardioversion or if his rate is not controlled.  I reviewed this with the patient.  Shortness of breath: I suspect she has got acute systolic heart failure possibly related to this for but an echo is pending.  Continue IV diuresis.  Med titration will be based on the results of the echo.  Kevin Doyle  2:25 PM  03/08/2023

## 2023-03-08 NOTE — Progress Notes (Signed)
Report received from [facility] [place] RN. No change from prior assessment. Will continue plan of care. Rashaun Curl, Yancey Flemings, RN

## 2023-03-08 NOTE — Discharge Instructions (Addendum)

## 2023-03-08 NOTE — TOC Initial Note (Signed)
Transition of Care [facility]) - Initial/Assessment Note    Patient Details  Name: Kevin Doyle MRN: [id number] Date of Birth: [birth year]  [facility] CM/SW Contact:    Lanier Clam, RN Phone Number: 03/08/2023, 10:59 AM  Clinical Narrative:  Patient to get own PCP. D/c plan home.                 Expected Discharge Plan: Home/Self Care Barriers to Discharge: Continued Medical Work up   Patient Goals and CMS Choice Patient states their goals for this hospitalization and ongoing recovery are:: Home CMS Medicare.gov Compare Post Acute Care list provided to:: Patient Choice offered to / list presented to : Patient [place] ownership interest in [facility].provided to:: Patient    Expected Discharge Plan and Services   Discharge Planning Services: CM Consult   Living arrangements for the past 2 months: Single Family Home                                      Prior Living Arrangements/Services Living arrangements for the past 2 months: Single Family Home Lives with:: Spouse Patient language and need for interpreter reviewed:: Yes Do you feel safe going back to the place where you live?: Yes      Need for Family Participation in Patient Care: Yes (Comment) Care giver support system in place?: Yes (comment)   Criminal Activity/Legal Involvement Pertinent to Current Situation/Hospitalization: No - Comment as needed  Activities of Daily Living Home Assistive Devices/Equipment: None ADL Screening (condition at time of admission) Patient's cognitive ability adequate to safely complete daily activities?: Yes Is the patient deaf or have difficulty hearing?: No Does the patient have difficulty seeing, even when wearing glasses/contacts?: No Does the patient have difficulty concentrating, remembering, or making decisions?: No Patient able to express need for assistance with ADLs?: Yes Does the patient have difficulty dressing or bathing?:  No Independently performs ADLs?: Yes (appropriate for developmental age) Does the patient have difficulty walking or climbing stairs?: No Weakness of Legs: None Weakness of Arms/Hands: None  Permission Sought/Granted Permission sought to share information with : Case Manager Permission granted to share information with : Yes, Verbal Permission Granted  Share Information with NAME: Case Manager           Emotional Assessment Appearance:: Appears stated age Attitude/Demeanor/Rapport: Gracious Affect (typically observed): Accepting Orientation: : Oriented to Self, Oriented to Place, Oriented to  Time, Oriented to Situation Alcohol / Substance Use: Not Applicable Psych Involvement: No (comment)  Admission diagnosis:  Atrial fibrillation with RVR (HCC) [I48.91] A-fib (HCC) [I48.91] Patient Active Problem List   Diagnosis Date Noted   Atrial fibrillation with RVR (HCC) 03/07/2023   CAP (community acquired pneumonia) 03/07/2023   A-fib (HCC) 03/07/2023   SBO (small bowel obstruction) (HCC) 09/01/2019   Renal calculi 09/01/2019   Elevated blood pressure reading 09/01/2019   Nausea with vomiting 08/27/2019   Central abdominal pain 08/27/2019   Diarrhea 08/27/2019   BPH (benign prostatic hyperplasia) 09/25/2013   Gilbert's syndrome 09/25/2013   Family history of arrhythmia 02/10/2013   Orthostatic hypotension 02/10/2013   Dizzy 02/10/2013   Atrial fibrillation (HCC) 02/10/2013   Hypokalemia 07/10/2012   Normocytic anemia 07/10/2012   Elevated LFTs 07/10/2012   S/P laparoscopic cholecystectomy Nove 2013 05/23/2012   Renal mass, right 05/19/2012   PCP:  Patient, No Pcp Per Pharmacy:   [id number] -  [place], [place] - [address] [place] [address] Phone: [phone] Fax: [phone]     Social Determinants of Health (SDOH) Social History: SDOH Screenings   Food Insecurity: No Food Insecurity (03/07/2023)  Housing: Low Risk  (03/07/2023)   Transportation Needs: No Transportation Needs (03/07/2023)  Utilities: Not At Risk (03/07/2023)  Social Connections: Unknown (11/29/2021)   Received from [facility]  Tobacco Use: Medium Risk (03/07/2023)   SDOH Interventions:     Readmission Risk Interventions     No data to display

## 2023-03-08 NOTE — Progress Notes (Signed)
  Echocardiogram 2D Echocardiogram has been performed.  Kevin Doyle 03/08/2023, 3:44 PM

## 2023-03-08 NOTE — Progress Notes (Signed)
2D echo attempted, consult in room. Will try later 

## 2023-03-09 ENCOUNTER — Inpatient Hospital Stay (HOSPITAL_COMMUNITY): Payer: Medicare Other

## 2023-03-09 ENCOUNTER — Inpatient Hospital Stay (HOSPITAL_COMMUNITY): Payer: Medicare Other | Admitting: Anesthesiology

## 2023-03-09 ENCOUNTER — Encounter (HOSPITAL_COMMUNITY): Payer: Self-pay | Admitting: Internal Medicine

## 2023-03-09 ENCOUNTER — Encounter (HOSPITAL_COMMUNITY)
Admission: EM | Disposition: A | Discharge status: Home / Self Care | Payer: Self-pay | Source: Home / Self Care | Attending: Family Medicine

## 2023-03-09 DIAGNOSIS — Z87891 Personal history of nicotine dependence: Secondary | ICD-10-CM

## 2023-03-09 DIAGNOSIS — I340 Nonrheumatic mitral (valve) insufficiency: Secondary | ICD-10-CM

## 2023-03-09 DIAGNOSIS — I509 Heart failure, unspecified: Secondary | ICD-10-CM

## 2023-03-09 DIAGNOSIS — I5020 Unspecified systolic (congestive) heart failure: Secondary | ICD-10-CM

## 2023-03-09 DIAGNOSIS — I3139 Other pericardial effusion (noninflammatory): Secondary | ICD-10-CM | POA: Diagnosis not present

## 2023-03-09 DIAGNOSIS — I4891 Unspecified atrial fibrillation: Secondary | ICD-10-CM | POA: Diagnosis not present

## 2023-03-09 DIAGNOSIS — I480 Paroxysmal atrial fibrillation: Secondary | ICD-10-CM | POA: Diagnosis not present

## 2023-03-09 HISTORY — PX: CARDIOVERSION: SHX1299

## 2023-03-09 HISTORY — PX: TEE WITHOUT CARDIOVERSION: SHX5443

## 2023-03-09 LAB — BASIC METABOLIC PANEL
Anion gap: 8 (ref 5–15)
BUN: 22 mg/dL (ref 8–23)
CO2: 22 mmol/L (ref 22–32)
Calcium: 8 mg/dL — ABNORMAL LOW (ref 8.9–10.3)
Chloride: 106 mmol/L (ref 98–111)
Creatinine, Ser: 1.01 mg/dL (ref 0.61–1.24)
GFR, Estimated: >60 mL/min (ref >60)
Glucose, Bld: 114 mg/dL — ABNORMAL HIGH (ref 70–99)
Potassium: 3.4 mmol/L — ABNORMAL LOW (ref 3.5–5.1)
Sodium: 136 mmol/L (ref 135–145)

## 2023-03-09 LAB — CBC
HCT: 33.3 % — ABNORMAL LOW (ref 39.0–52.0)
Hemoglobin: 10.9 g/dL — ABNORMAL LOW (ref 13.0–17.0)
MCH: 30.1 pg (ref 26.0–34.0)
MCHC: 32.7 g/dL (ref 30.0–36.0)
MCV: 92 fL (ref 80.0–100.0)
Platelets: 226 10*3/uL (ref 150–400)
RBC: 3.62 MIL/uL — ABNORMAL LOW (ref 4.22–5.81)
RDW: 13 % (ref 11.5–15.5)
WBC: 7.5 10*3/uL (ref 4.0–10.5)
nRBC: 0 % (ref 0.0–0.2)

## 2023-03-09 LAB — MAGNESIUM: Magnesium: 1.9 mg/dL (ref 1.7–2.4)

## 2023-03-09 LAB — ECHO TEE

## 2023-03-09 LAB — HAPTOGLOBIN: Haptoglobin: 253 mg/dL (ref 34–355)

## 2023-03-09 SURGERY — ECHOCARDIOGRAM, TRANSESOPHAGEAL
Anesthesia: Monitor Anesthesia Care

## 2023-03-09 MED ORDER — AMIODARONE HCL 200 MG PO TABS: 400.0000 mg | ORAL_TABLET | Freq: Two times a day (BID) | ORAL | Status: DC

## 2023-03-09 MED ORDER — FUROSEMIDE 10 MG/ML IJ SOLN
60.0000 mg | Freq: Once | INTRAMUSCULAR | Status: AC
  Administered 2023-03-09: 60 mg via INTRAVENOUS
  Filled 2023-03-09: qty 6

## 2023-03-09 MED ORDER — AMIODARONE LOAD VIA INFUSION
150.0000 mg | Freq: Once | INTRAVENOUS | Status: AC
  Administered 2023-03-09: 150 mg via INTRAVENOUS

## 2023-03-09 MED ORDER — PROPOFOL 10 MG/ML IV BOLUS
INTRAVENOUS | Status: DC | PRN
  Administered 2023-03-09: 30 mg via INTRAVENOUS
  Administered 2023-03-09: 20 mg via INTRAVENOUS
  Administered 2023-03-09: 100 mg via INTRAVENOUS
  Administered 2023-03-09 (×2): 20 mg via INTRAVENOUS

## 2023-03-09 MED ORDER — LIDOCAINE 2% (20 MG/ML) 5 ML SYRINGE
INTRAMUSCULAR | Status: DC | PRN
  Administered 2023-03-09: 100 mg via INTRAVENOUS

## 2023-03-09 MED ORDER — AMIODARONE HCL 150 MG/3ML IV SOLN
INTRAVENOUS | Status: AC
  Filled 2023-03-09: qty 3

## 2023-03-09 MED ORDER — AMIODARONE HCL IN DEXTROSE 360-4.14 MG/200ML-% IV SOLN
60.0000 mg/h | INTRAVENOUS | Status: AC
  Administered 2023-03-09 (×2): 60 mg/h via INTRAVENOUS
  Filled 2023-03-09: qty 200

## 2023-03-09 MED ORDER — SODIUM CHLORIDE 0.9 % IV SOLN: INTRAVENOUS | Status: DC

## 2023-03-09 MED ORDER — AMIODARONE HCL IN DEXTROSE 360-4.14 MG/200ML-% IV SOLN
30.0000 mg/h | INTRAVENOUS | Status: DC
  Administered 2023-03-09 – 2023-03-11 (×6): 30 mg/h via INTRAVENOUS
  Filled 2023-03-09 (×5): qty 200

## 2023-03-09 MED ORDER — METOPROLOL TARTRATE 50 MG PO TABS
50.0000 mg | ORAL_TABLET | Freq: Two times a day (BID) | ORAL | Status: DC
  Administered 2023-03-09 – 2023-03-10 (×2): 50 mg via ORAL
  Filled 2023-03-09 (×3): qty 1

## 2023-03-09 MED ORDER — POTASSIUM CHLORIDE CRYS ER 20 MEQ PO TBCR
40.0000 meq | EXTENDED_RELEASE_TABLET | Freq: Once | ORAL | Status: AC
  Administered 2023-03-09: 40 meq via ORAL
  Filled 2023-03-09: qty 2

## 2023-03-09 MED ORDER — AMIODARONE HCL 150 MG/3ML IV SOLN
INTRAVENOUS | Status: AC
  Filled 2023-03-09: qty 6

## 2023-03-09 MED ORDER — AMIODARONE HCL IN DEXTROSE 360-4.14 MG/200ML-% IV SOLN
INTRAVENOUS | Status: AC
  Filled 2023-03-09: qty 200

## 2023-03-09 MED ORDER — POTASSIUM CHLORIDE CRYS ER 20 MEQ PO TBCR: 40.0000 meq | EXTENDED_RELEASE_TABLET | Freq: Once | ORAL | Status: DC

## 2023-03-09 SURGICAL SUPPLY — 1 items: ELECT DEFIB PAD ADLT CADENCE (PAD) ×1 IMPLANT

## 2023-03-09 NOTE — Anesthesia Postprocedure Evaluation (Signed)
Anesthesia Post Note  Patient: Kevin Doyle  Procedure(s) Performed: TRANSESOPHAGEAL ECHOCARDIOGRAM CARDIOVERSION     Patient location during evaluation: PACU Anesthesia Type: MAC Level of consciousness: awake Pain management: pain level controlled Vital Signs Assessment: post-procedure vital signs reviewed and stable Respiratory status: spontaneous breathing, nonlabored ventilation and respiratory function stable Cardiovascular status: stable and blood pressure returned to baseline Postop Assessment: no apparent nausea or vomiting Anesthetic complications: no   No notable events documented.  Last Vitals:  Vitals:   03/09/23 1225 03/09/23 1227  BP: 122/78   Pulse: (!) 106 (!) 114  Resp: (!) 26 (!) 23  Temp:    SpO2: 92% 92%    Last Pain:  Vitals:   03/09/23 1155  TempSrc: Temporal  PainSc: 0-No pain                 Linton Rump

## 2023-03-09 NOTE — Anesthesia Preprocedure Evaluation (Addendum)
Anesthesia Evaluation  Patient identified by MRN, date of birth, ID band Patient awake    Reviewed: Allergy & Precautions, NPO status , Patient's Chart, lab work & pertinent test results  History of Anesthesia Complications Negative for: history of anesthetic complications  Airway Mallampati: I  TM Distance: >3 FB Neck ROM: Full   Comment: Previous grade I view with Miller 2, easy mask Dental  (+) Dental Advisory Given   Pulmonary neg shortness of breath, neg sleep apnea, neg COPD, neg recent URI, former smoker (quit 37 years ago)   Pulmonary exam normal breath sounds clear to auscultation       Cardiovascular (-) hypertensionpulmonary hypertension (moderate)(-) angina +CHF (EF 45-50%)  (-) Past MI, (-) Cardiac Stents and (-) CABG + dysrhythmias Atrial Fibrillation + Valvular Problems/Murmurs (mild-to-moderate) MR  Rhythm:Irregular Rate:Normal  TTE 03/08/2023: IMPRESSIONS     1. Left ventricular ejection fraction, by estimation, is 45 to 50%. The  left ventricle has mildly decreased function. The left ventricle  demonstrates global hypokinesis. The left ventricular internal cavity size  was mildly to moderately dilated. Left  ventricular diastolic function could not be evaluated.   2. Right ventricular systolic function is normal. The right ventricular  size is normal. There is moderately elevated pulmonary artery systolic  pressure.   3. The mitral valve is grossly normal. Mild to moderate mitral valve  regurgitation. No evidence of mitral stenosis.   4. The aortic valve is tricuspid. There is mild calcification of the  aortic valve. Aortic valve regurgitation is not visualized. No aortic  stenosis is present.   5. The inferior vena cava is dilated in size with <50% respiratory  variability, suggesting right atrial pressure of 15 mmHg.     Neuro/Psych negative neurological ROS     GI/Hepatic ,GERD  Medicated,,Gilbert's  disease   Endo/Other  negative endocrine ROS    Renal/GU Renal disease (s/p partial right nephrectomy)     Musculoskeletal   Abdominal   Peds  Hematology  (+) Blood dyscrasia, anemia Lab Results      Component                Value               Date                      WBC                      7.5                 03/09/2023                HGB                      10.9 (L)            03/09/2023                HCT                      33.3 (L)            03/09/2023                MCV                      92.0  03/09/2023                PLT                      226                 03/09/2023              Anesthesia Other Findings   Reproductive/Obstetrics                             Anesthesia Physical Anesthesia Plan  ASA: 3  Anesthesia Plan: MAC   Post-op Pain Management:    Induction: Intravenous  PONV Risk Score and Plan: 1 and Propofol infusion, TIVA and Treatment may vary due to age or medical condition  Airway Management Planned: Natural Airway and Nasal Cannula  Additional Equipment:   Intra-op Plan:   Post-operative Plan:   Informed Consent: I have reviewed the patients History and Physical, chart, labs and discussed the procedure including the risks, benefits and alternatives for the proposed anesthesia with the patient or authorized representative who has indicated his/her understanding and acceptance.     Dental advisory given  Plan Discussed with: CRNA and Anesthesiologist  Anesthesia Plan Comments: (Discussed with patient risks of MAC including, but not limited to, minor pain or discomfort, hearing people in the room, and possible need for backup general anesthesia. Risks for general anesthesia also discussed including, but not limited to, sore throat, hoarse voice, chipped/damaged teeth, injury to vocal cords, nausea and vomiting, allergic reactions, lung infection, heart attack, stroke, and death. All  questions answered. )       Anesthesia Quick Evaluation

## 2023-03-09 NOTE — Plan of Care (Signed)
  Problem: Health Behavior/Discharge Planning: Goal: Ability to manage health-related needs will improve Outcome: Progressing   Problem: Clinical Measurements: Goal: Ability to maintain clinical measurements within normal limits will improve Outcome: Progressing Goal: Will remain free from infection Outcome: Progressing   

## 2023-03-09 NOTE — Transfer of Care (Signed)
Immediate Anesthesia Transfer of Care Note  Patient: Kevin Doyle  Procedure(s) Performed: TRANSESOPHAGEAL ECHOCARDIOGRAM CARDIOVERSION  Patient Location: PACU and Cath Lab  Anesthesia Type:MAC and General  Level of Consciousness: drowsy, patient cooperative, and responds to stimulation  Airway & Oxygen Therapy: Patient Spontanous Breathing and Patient connected to nasal cannula oxygen  Post-op Assessment: Report given to RN and Post -op Vital signs reviewed and stable  Post vital signs: Reviewed and stable  Last Vitals:  Vitals Value Taken Time  BP    Temp    Pulse    Resp    SpO2      Last Pain:  Vitals:   03/09/23 1017  TempSrc:   PainSc: 0-No pain      Patients Stated Pain Goal: 0 (03/07/23 1830)  Complications: No notable events documented.

## 2023-03-09 NOTE — CV Procedure (Addendum)
   TRANSESOPHAGEAL ECHOCARDIOGRAM GUIDED DIRECT CURRENT CARDIOVERSION  NAME:  Kevin Doyle    MRN: [id number] DOB:  [birth year]    ADMIT DATE: 03/07/2023  INDICATIONS: Symptomatic atrial fibrillation  PROCEDURE:   Informed consent was obtained prior to the procedure. The risks, benefits and alternatives for the procedure were discussed and the patient comprehended these risks.  Risks include, but are not limited to, cough, sore throat, vomiting, nausea, somnolence, esophageal and stomach trauma or perforation, bleeding, low blood pressure, aspiration, pneumonia, infection, trauma to the teeth and death.    After a procedural time-out, the oropharynx was anesthetized and the patient was sedated by the anesthesia service. The transesophageal probe was inserted in the esophagus and stomach without difficulty and multiple views were obtained. Anesthesia was monitored by Dr. Isaias Cowman and team.   COMPLICATIONS:    Complications: No complications Patient tolerated procedure well.  KEY FINDINGS:  Mild to moderate mitral regurgitation- atrial functional Patent left atrial appendage.  Full Report to follow.   CARDIOVERSION:     Indications:  Symptomatic Atrial Fibrillation  Procedure Details:  Once the TEE was complete, the patient had the defibrillator pads placed in the anterior and posterior position. Once an appropriate level of sedation was confirmed, the patient was cardioverted x 1 with 200J of biphasic synchronized energy.  The patient converted to NSR.  Quickly returned to atrial fibrillation.  A second cardioversion was performed at 200 Joules.  Again, within 15 seconds, returned to atrial fibrillation.  There were no apparent complications.  The patient had normal neuro status and respiratory status post procedure with vitals stable as recorded elsewhere.  Adequate airway was maintained throughout and vital signs monitored per protocol.  Riley Lam, MD [place]  [facility]  [facility]  11:51 AM  Attempted to call [name] at [phone]. Will start amiodarone.  If PO amiodarone does not improve rate control, will start IV amiodarone.  At 1600, would recommend lasix 60mg  IVX1.   Riley Lam, MD FASE [facility] Cardiologist [facility]  [address] [place], [address] [place], [place] [address] [phone]  11:56 AM

## 2023-03-09 NOTE — Interval H&P Note (Signed)
History and Physical Interval Note:  03/09/2023 10:30 AM  Kevin Doyle  has presented today for surgery, with the diagnosis of afib.  The various methods of treatment have been discussed with the patient and family. After consideration of risks, benefits and other options for treatment, the patient has consented to  Procedure(s): TRANSESOPHAGEAL ECHOCARDIOGRAM (N/A) CARDIOVERSION (N/A) as a surgical intervention.  The patient's history has been reviewed, patient examined, no change in status, stable for surgery.  I have reviewed the patient's chart and labs.  Questions were answered to the patient's satisfaction.     Arrington Yohe A Juston Goheen

## 2023-03-09 NOTE — Progress Notes (Addendum)
Patient Name: Kevin Doyle Date of Encounter: 03/09/2023 [facility] Cardiologist: Rollene Rotunda, MD   Interval Summary  .    Patient has a few concerns this AM- was told that his oxygen saturation was low overnight, but he did not feel short of breath. No orthopnea. No chest pain. Occasionally has palpitations and can tell that his HR is elevated.   Patient on for TEE guided DCCV today   Vital Signs .    Vitals:   03/08/23 1700 03/08/23 2035 03/09/23 0022 03/09/23 0400  BP: 122/69 107/84 105/64 111/72  Pulse: (!) 106 (!) 106  (!) 102  Resp: (!) 23 (!) 24  (!) 24  Temp:  99.7 F (37.6 C) 99.4 F (37.4 C) 98.5 F (36.9 C)  TempSrc:  Oral  Oral  SpO2: 94% 95%  91%  Weight:      Height:        Intake/Output Summary (Last 24 hours) at 03/09/2023 0750 Last data filed at 03/09/2023 0600 Gross per 24 hour  Intake 497.04 ml  Output 2375 ml  Net -1877.96 ml      03/07/2023   10:27 AM 11/06/2019    8:42 AM 10/08/2019    8:19 AM  Last 3 Weights  Weight (lbs) 208 lb 1.6 oz 194 lb 6 oz 193 lb  Weight (kg) 94.394 kg 88.168 kg 87.544 kg      Telemetry/ECG    Atrial fibrillation, HR in the 100s-120s - Personally Reviewed  Physical Exam .   GEN: No acute distress.  Sitting upright on the side of the bed  Neck: No JVD Cardiac: Irregular rate and rhythm, tachycardic. no murmurs, rubs, or gallops.  Respiratory: Crackles in bilateral lung bases. Normal work of breathing on room air GI: Soft, nontender, non-distended  MS: No edema in BLE   Assessment & Plan .     Paroxysmal atrial fibrillation - Patient previously did have an episode of atrial fibrillation in 2014 in the setting of vertigo/severe vomiting.  He did not have recurrence of A-fib so was not started on anticoagulation or rate controlling medications at that time. - Now patient presenting complaining of an irregular heartbeat, fatigue, dry cough.  Found to be in atrial fibrillation with heart rates up to  the 170s in the ER.  TSH normal.  BNP elevated to 859.5.  Respiratory panel and COVID-negative - Now on cardizem infusion and metoprolol tartrate 25 mg BID- per telemetry, patient remains in afib with HR in the 100s-120s - Echo showed EF 45-50%- stop cardizem due to reduced EF - Increase metoprolol tartrate to 50 mg BID  - Given reduced EF and difficult to control HR, favor TEE guided DCCV this admission. Patient has not eating yet this AM, so able to schedule procedure for this afternoon  - Continue Eliquis 5 mg twice daily-CHA2DS2-VASc 1 (age). 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 BNP elevated to 859.5 on presentation - Echo this admission showed EF 45-50%, normal RV function, mild-moderate MR. Suspect that EF is reduced due to tachycardia/atrial fibrillation  - Received 2 doses of IV lasix yesterday- output 2.3 L urine yesterday. Renal function stable   - Continues to have crackles in lung bases and had hypoxia overnight- continue IV lasix  - Gave K supplementation due  to K 3.4 this AM  - Plan to repeat echocardiogram in 2-3 months after restoration of NSR  - Now on metoprolol tartrate 50 mg BID- plan to consolidate to metoprolol succinate prior to DC. Add additional GDMT as BP tolerates after DCCV    Informed Consent   Shared Decision Making/Informed Consent The risks [stroke, cardiac arrhythmias rarely resulting in the need for a temporary or permanent pacemaker, skin irritation or burns, esophageal damage, perforation (1:10,000 risk), bleeding, pharyngeal hematoma as well as other potential complications associated with conscious sedation including aspiration, arrhythmia, respiratory failure and death], benefits (treatment guidance, restoration of normal sinus rhythm, diagnostic support) and  alternatives of a transesophageal echocardiogram guided cardioversion were discussed in detail with Mr. Pitoniak and he is willing to proceed.      For questions or updates, please contact [facility] Please consult [email or web address] for contact info under        Signed, Jonita Albee, PA-C    Personally seen and examined. Agree with APP above with the following comments: AF and HFrEF- still symptomatic. IRIR rhythm and tachycardia on exam.  Crackles bilaterally. Dullness to percussion on abdomen.  Sat on RA 94% with good wave form. No LE edema. TEE/DCCV today. Still volume up.  Will increase K and IV lasix dose for PM.  Hopefully he will be euvolemic and in rhythm tomorrow for discharge.  Riley Lam, MD FASE [facility] Cardiologist [facility]  [address] [place], [address] [place], [place] [address] [phone]  10:29 AM

## 2023-03-09 NOTE — Hospital Course (Addendum)
  Brief Narrative:  72 year old with history of paroxysmal A-fib, GERD comes to the hospital for evaluation of palpitations and fatigue.  In the ER patient was noted to be in atrial fibrillation with RVR therefore admitted to the hospital.  Initially patient was started on Cardizem drip, cardiology team was consulted.  Echocardiogram showed EF of 45%, global hypokinesia, elevated PASP, dilated CM.     Assessment & Plan:  Principal Problem:   Atrial fibrillation with RVR (HCC) Active Problems:   Hypokalemia   Normocytic anemia   BPH (benign prostatic hyperplasia)   CAP (community acquired pneumonia)   A-fib (HCC)     Atrial fibrillation with RVR (HCC) Known history of paroxysmal atrial fibrillation not on any anticoagulation at home.  .  Echocardiogram showed EF of 45%, global hypokinesia, elevated PASP, dilated CM. TSH is normal.  Currently on Cardizem drip, slowly adding and titrating AV nodal blocker.  Eliquis twice daily.  Defer the timing of cardioversion to cardiology team especially if he remains in A-fib   Congestive heart failure with reduced EF 45%. Suspect secondary to some edema/fluid.  No obvious evidence of infection.  Received dose of Lasix.  Continue as needed bronchodilators, I-S/flutter valve.  No need for antibiotics.  Procalcitonin is negative.  Will defer further GDMT per cardiology.   Vitamin B12 deficiency - Supplements ordered   Normocytic anemia Borderline low saturation and ferritin.  Hemoglobin overall stable at 11.*.  Iron supplements with bowel regimen     DVT prophylaxis: SCDs Start: 03/07/23 1956 apixaban (ELIQUIS) tablet 5 mg   Code Status: Full code Family Communication: Daughter on the phone Status is: Inpatient Remains inpatient appropriate because: Seen and examined at bedside, continue hospital stay for diuretics

## 2023-03-09 NOTE — Progress Notes (Signed)
Patient transferred to [facility] for cardioversion this morning prior to my evaluation. Later I was notified by the nursing staff here at [facility] long that patient was now hypoxic requiring 4-5 L nasal cannula with unsuccessful cardioversion still heart rate in 130s now on amiodarone drip. I have notified cardiology patient should remain at [facility] especially if he is going to need EP eval. They agree.   Dr Benjamine Mola from [facility] notified who will evaluate the patient from [facility] service to ensure patient is stable. And will continue to follow.  Mavin Dyke  TRH

## 2023-03-09 NOTE — Plan of Care (Signed)
  Problem: Safety: Goal: Ability to remain free from injury will improve Outcome: Progressing   

## 2023-03-09 NOTE — Progress Notes (Signed)
PROGRESS NOTE    Kevin Doyle  HQI:[id number] DOB: [birth year] DOA: 03/07/2023 PCP: Patient, No Pcp Per    Brief Narrative:   Brief Narrative:  72 year old with history of paroxysmal A-fib, GERD comes to the hospital for evaluation of palpitations and fatigue.  In the ER patient was noted to be in atrial fibrillation with RVR therefore admitted to the hospital.  S/p cardioversion x 1 which was unsuccessful.       Assessment & Plan:  Principal Problem:   Atrial fibrillation with RVR (HCC) Active Problems:   Hypokalemia   Normocytic anemia   BPH (benign prostatic hyperplasia)   CAP (community acquired pneumonia)   A-fib (HCC)     Atrial fibrillation with RVR (HCC) Known history of paroxysmal atrial fibrillation not on any anticoagulation at home.  -cardioversion attempted on 8/23 without success - Patient started on Eliquis upon admission.  Echocardiogram ordered.  TSH is normal.   Multifocal infiltrate with new acute respiratory failure -?volume overload.  Respiratory panel COVID-19 is negative.   -BNP is elevated, procalcitonin negative and so is WBC count so doubt PNA  -  Lasix has been ordered. -check x ray Bronchodilators.  I-S/flutter valve. -wean O2 as able   Vitamin B12 deficiency - Supplement   Normocytic anemia Borderline low saturation and ferritin.  Hemoglobin overall stable at 11 -supplement  Hypokalemia -replete    DVT prophylaxis: SCDs Start: 03/07/23 1956 apixaban (ELIQUIS) tablet 5 mg   Code Status: Full code  Status is: Inpatient Remains inpatient appropriate because:         DVT prophylaxis: SCDs Start: 03/07/23 1956 apixaban (ELIQUIS) tablet 5 mg    Code Status: Full Code  Disposition Plan:  Level of care: Progressive Status is: Inpatient Remains inpatient appropriate    Consultants:  cards   Subjective: Overnight was placed on O2 for low pulse ox, no symptoms Denies calf pain/tenderness-- did have fever to  admission   Objective: Vitals:   03/09/23 1215 03/09/23 1220 03/09/23 1225 03/09/23 1227  BP: (!) 123/95 121/89 122/78   Pulse: (!) 117 (!) 112 (!) 106 (!) 114  Resp: (!) 25 (!) 25 (!) 26 (!) 23  Temp:      TempSrc:      SpO2: 93% 92% 92% 92%  Weight:      Height:        Intake/Output Summary (Last 24 hours) at 03/09/2023 1259 Last data filed at 03/09/2023 1144 Gross per 24 hour  Intake 747.04 ml  Output 2425 ml  Net -1677.96 ml   Filed Weights   03/07/23 1027  Weight: 94.4 kg    Examination:   General: Appearance:     Overweight male in no acute distress     Lungs:     On [place], crackles at bases, respirations unlabored  Heart:    Tachycardic. irregular   MS:   All extremities are intact.    Neurologic:   Awake, alert       Data Reviewed: I have personally reviewed following labs and imaging studies  CBC: Recent Labs  Lab 03/07/23 1030 03/08/23 0505 03/09/23 0438  WBC 8.7 9.2 7.5  HGB 11.6* 11.0* 10.9*  HCT 35.4* 34.8* 33.3*  MCV 91.9 96.9 92.0  PLT 194 195 226   Basic Metabolic Panel: Recent Labs  Lab 03/07/23 1030 03/08/23 0505 03/09/23 0438  NA 138 136 136  K 3.4* 4.0 3.4*  CL 107 109 106  CO2 24 19* 22  GLUCOSE 133* 120* 114*  BUN 21 19 22   CREATININE 1.05 0.90 1.01  CALCIUM 7.8* 7.9* 8.0*  MG 1.8  --  1.9   GFR: Estimated Creatinine Clearance: 78 mL/min (by C-G formula based on SCr of 1.01 mg/dL). Liver Function Tests: Recent Labs  Lab 03/07/23 1211  AST 16  ALT 17  ALKPHOS 83  BILITOT 3.7*  PROT 6.3*  ALBUMIN 3.2*   No results for input(s): "LIPASE", "AMYLASE" in the last 168 hours. No results for input(s): "AMMONIA" in the last 168 hours. Coagulation Profile: Recent Labs  Lab 03/07/23 1030 03/08/23 0505  INR 1.3* 1.5*   Cardiac Enzymes: No results for input(s): "CKTOTAL", "CKMB", "CKMBINDEX", "TROPONINI" in the last 168 hours. BNP (last 3 results) No results for input(s): "PROBNP" in the last 8760 hours. HbA1C: No  results for input(s): "HGBA1C" in the last 72 hours. CBG: No results for input(s): "GLUCAP" in the last 168 hours. Lipid Profile: No results for input(s): "CHOL", "HDL", "LDLCALC", "TRIG", "CHOLHDL", "LDLDIRECT" in the last 72 hours. Thyroid Function Tests: Recent Labs    03/07/23 1030  TSH 1.786   Anemia Panel: Recent Labs    03/08/23 0505  VITAMINB12 158*  FOLATE 24.3  FERRITIN 102  TIBC 266  IRON 23*  RETICCTPCT 1.6   Sepsis Labs: Recent Labs  Lab 03/08/23 0504  PROCALCITON <0.10    Recent Results (from the past 240 hour(s))  Respiratory (~20 pathogens) panel by PCR     Status: None   Collection Time: 03/07/23  7:23 PM   Specimen: Nasopharyngeal Swab; Respiratory  Result Value Ref Range Status   Adenovirus NOT DETECTED NOT DETECTED Final   Coronavirus 229E NOT DETECTED NOT DETECTED Final    Comment: (NOTE) The Coronavirus on the Respiratory Panel, DOES NOT test for the novel  Coronavirus (2019 nCoV)    Coronavirus HKU1 NOT DETECTED NOT DETECTED Final   Coronavirus NL63 NOT DETECTED NOT DETECTED Final   Coronavirus OC43 NOT DETECTED NOT DETECTED Final   Metapneumovirus NOT DETECTED NOT DETECTED Final   Rhinovirus / Enterovirus NOT DETECTED NOT DETECTED Final   Influenza A NOT DETECTED NOT DETECTED Final   Influenza B NOT DETECTED NOT DETECTED Final   Parainfluenza Virus 1 NOT DETECTED NOT DETECTED Final   Parainfluenza Virus 2 NOT DETECTED NOT DETECTED Final   Parainfluenza Virus 3 NOT DETECTED NOT DETECTED Final   Parainfluenza Virus 4 NOT DETECTED NOT DETECTED Final   Respiratory Syncytial Virus NOT DETECTED NOT DETECTED Final   Bordetella pertussis NOT DETECTED NOT DETECTED Final   Bordetella Parapertussis NOT DETECTED NOT DETECTED Final   Chlamydophila pneumoniae NOT DETECTED NOT DETECTED Final   Mycoplasma pneumoniae NOT DETECTED NOT DETECTED Final    Comment: Performed at [facility], [address] [place], [place] [address]  SARS Coronavirus  2 by RT PCR (hospital order, performed in [facility] hospital lab) *cepheid single result test* Anterior Nasal Swab     Status: None   Collection Time: 03/07/23  7:23 PM   Specimen: Anterior Nasal Swab  Result Value Ref Range Status   SARS Coronavirus 2 by RT PCR NEGATIVE NEGATIVE Final    Comment: (NOTE) SARS-CoV-2 target nucleic acids are NOT DETECTED.  The SARS-CoV-2 RNA is generally detectable in upper and lower respiratory specimens during the acute phase of infection. The lowest concentration of SARS-CoV-2 viral copies this assay can detect is 250 copies / mL. A negative result does not preclude SARS-CoV-2 infection and should not be used as the sole basis  for treatment or other patient management decisions.  A negative result may occur with improper specimen collection / handling, submission of specimen other than nasopharyngeal swab, presence of viral mutation(s) within the areas targeted by this assay, and inadequate number of viral copies (<250 copies / mL). A negative result must be combined with clinical observations, patient history, and epidemiological information.  Fact Sheet for Patients:   RoadLapTop.co.za  Fact Sheet for Healthcare Providers: http://kim-miller.com/  This test is not yet approved or  cleared by the [place] FDA and has been authorized for detection and/or diagnosis of SARS-CoV-2 by FDA under an Emergency Use Authorization (EUA).  This EUA will remain in effect (meaning this test can be used) for the duration of the COVID-19 declaration under Section 564(b)(1) of the Act, 21 U.S.C. section 360bbb-3(b)(1), unless the authorization is terminated or revoked sooner.  Performed at [facility], [address] [place], [place] [address]   Culture, blood (Routine X 2) w Reflex to ID Panel     Status: None (Preliminary result)   Collection Time: 03/07/23  9:23 PM   Specimen: BLOOD  RIGHT ARM  Result Value Ref Range Status   Specimen Description   Final    BLOOD RIGHT ARM Performed at [facility], [address], [place], [place] [address]    Special Requests   Final    BOTTLES DRAWN AEROBIC AND ANAEROBIC Blood Culture results may not be optimal due to an inadequate volume of blood received in culture bottles Performed at [facility], [address] [place], [place] [address]    Culture   Final    NO GROWTH 2 DAYS Performed at [facility], [address], [place], [place] [address]    Report Status PENDING  Incomplete  Culture, blood (Routine X 2) w Reflex to ID Panel     Status: None (Preliminary result)   Collection Time: 03/07/23  9:23 PM   Specimen: BLOOD LEFT HAND  Result Value Ref Range Status   Specimen Description   Final    BLOOD LEFT HAND Performed at [facility], [address], [place], [place] [address]    Special Requests   Final    BOTTLES DRAWN AEROBIC AND ANAEROBIC Blood Culture results may not be optimal due to an inadequate volume of blood received in culture bottles Performed at [facility], [address] [place], [place] [address]    Culture   Final    NO GROWTH 2 DAYS Performed at [facility], [address] [place], [place] [address]    Report Status PENDING  Incomplete         Radiology Studies: EP STUDY  Result Date: 03/09/2023 See surgical note for result.  ECHOCARDIOGRAM COMPLETE  Result Date: 03/08/2023    ECHOCARDIOGRAM REPORT   Patient Name:   Kevin Doyle [facility] Date of Exam: 03/08/2023 Medical Rec #:  [id number]        Height:       74.0 in Accession #:    [id number]       Weight:       208.1 lb Date of Birth:  1951-07-13        BSA:          2.210 m Patient Age:    71 years         BP:           111/68 mmHg Patient Gender: M  HR:           102 bpm. Exam Location:  Inpatient Procedure: 2D Echo, Cardiac Doppler and Color Doppler  Indications:    I48.91* Unspeicified atrial fibrillation  History:        Patient has no prior history of Echocardiogram examinations.                 Abnormal ECG, Arrythmias:Atrial Fibrillation;                 Signs/Symptoms:Dizziness/Lightheadedness, Dyspnea and Shortness                 of Breath. Pneumonia.  Sonographer:    Sheralyn Boatman RDCS Referring Phys: [id number] [facility] GOEL IMPRESSIONS  1. Left ventricular ejection fraction, by estimation, is 45 to 50%. The left ventricle has mildly decreased function. The left ventricle demonstrates global hypokinesis. The left ventricular internal cavity size was mildly to moderately dilated. Left ventricular diastolic function could not be evaluated.  2. Right ventricular systolic function is normal. The right ventricular size is normal. There is moderately elevated pulmonary artery systolic pressure.  3. The mitral valve is grossly normal. Mild to moderate mitral valve regurgitation. No evidence of mitral stenosis.  4. The aortic valve is tricuspid. There is mild calcification of the aortic valve. Aortic valve regurgitation is not visualized. No aortic stenosis is present.  5. The inferior vena cava is dilated in size with <50% respiratory variability, suggesting right atrial pressure of 15 mmHg. Comparison(s): No prior Echocardiogram. Conclusion(s)/Recommendation(s): Tachycardia throughout study limits sensitivity for detection of focal wall motion abnormalities. FINDINGS  Left Ventricle: Left ventricular ejection fraction, by estimation, is 45 to 50%. The left ventricle has mildly decreased function. The left ventricle demonstrates global hypokinesis. The left ventricular internal cavity size was mildly to moderately dilated. There is borderline left ventricular hypertrophy. Left ventricular diastolic function could not be evaluated due to atrial fibrillation. Left ventricular diastolic function could not be evaluated. Right Ventricle: The right ventricular size is  normal. Right vetricular wall thickness was not well visualized. Right ventricular systolic function is normal. There is moderately elevated pulmonary artery systolic pressure. The tricuspid regurgitant velocity is 2.80 m/s, and with an assumed right atrial pressure of 15 mmHg, the estimated right ventricular systolic pressure is 46.4 mmHg. Left Atrium: Left atrial size was normal in size. Right Atrium: Right atrial size was normal in size. Pericardium: Trivial pericardial effusion is present. Mitral Valve: The mitral valve is grossly normal. Mild to moderate mitral valve regurgitation. No evidence of mitral valve stenosis. Tricuspid Valve: The tricuspid valve is normal in structure. Tricuspid valve regurgitation is mild . No evidence of tricuspid stenosis. Aortic Valve: The aortic valve is tricuspid. There is mild calcification of the aortic valve. Aortic valve regurgitation is not visualized. No aortic stenosis is present. Pulmonic Valve: The pulmonic valve was not well visualized. Pulmonic valve regurgitation is not visualized. No evidence of pulmonic stenosis. Aorta: The aortic root, ascending aorta and aortic arch are all structurally normal, with no evidence of dilitation or obstruction. Venous: The inferior vena cava is dilated in size with less than 50% respiratory variability, suggesting right atrial pressure of 15 mmHg. IAS/Shunts: The atrial septum is grossly normal.  LEFT VENTRICLE PLAX 2D LVIDd:         5.50 cm LVIDs:         4.40 cm LV PW:         1.10 cm LV IVS:  1.10 cm LVOT diam:     2.60 cm LV SV:         73 LV SV Index:   33 LVOT Area:     5.31 cm  LV Volumes (MOD) LV vol d, MOD A2C: 105.0 ml LV vol d, MOD A4C: 75.4 ml LV vol s, MOD A2C: 56.0 ml LV vol s, MOD A4C: 38.9 ml LV SV MOD A2C:     49.0 ml LV SV MOD A4C:     75.4 ml LV SV MOD BP:      43.9 ml RIGHT VENTRICLE             IVC RV S prime:     12.10 cm/s  IVC diam: 2.50 cm TAPSE (M-mode): 1.9 cm LEFT ATRIUM             Index         RIGHT ATRIUM           Index LA diam:        4.40 cm 1.99 cm/m   RA Area:     15.40 cm LA Vol (A2C):   53.4 ml 24.16 ml/m  RA Volume:   42.50 ml  19.23 ml/m LA Vol (A4C):   65.8 ml 29.77 ml/m LA Biplane Vol: 59.2 ml 26.78 ml/m  AORTIC VALVE LVOT Vmax:   93.60 cm/s LVOT Vmean:  62.600 cm/s LVOT VTI:    0.138 m  AORTA Ao Root diam: 3.50 cm Ao Asc diam:  3.50 cm MITRAL VALVE               TRICUSPID VALVE MV Area (PHT): 3.03 cm    TR Peak grad:   31.4 mmHg MV Decel Time: 250 msec    TR Vmax:        280.00 cm/s MV E velocity: 94.10 cm/s                            SHUNTS                            Systemic VTI:  0.14 m                            Systemic Diam: 2.60 cm Jodelle Red MD Electronically signed by Jodelle Red MD Signature Date/Time: 03/08/2023/5:13:55 PM    Final         Scheduled Meds:  [MAR Hold] acidophilus  2 capsule Oral Daily   [MAR Hold] apixaban  5 mg Oral BID   [MAR Hold] vitamin B-12  500 mcg Oral Daily   [MAR Hold] docusate sodium  100 mg Oral BID   [MAR Hold] ferrous sulfate  325 mg Oral Q breakfast   [MAR Hold] furosemide  40 mg Intravenous BID   furosemide  60 mg Intravenous Once   [MAR Hold] metoprolol tartrate  50 mg Oral BID   [MAR Hold] potassium chloride  40 mEq Oral Once   [MAR Hold] sodium chloride flush  3 mL Intravenous Q12H   Continuous Infusions:  sodium chloride     amiodarone 60 mg/hr (03/09/23 1215)   Followed by   amiodarone       LOS: 2 days    Time spent: 45 minutes spent on chart review, discussion with nursing staff, consultants, updating family and interview/physical exam; more than 50% of that time was spent  in counseling and/or coordination of care.    Joseph Art, DO [facility] Available via Epic secure chat 7am-7pm After these hours, please refer to coverage provider listed on [email or web address] 03/09/2023, 12:59 PM

## 2023-03-09 NOTE — H&P (View-Only) (Signed)
Patient Name: Kevin Doyle Date of Encounter: 03/09/2023 [facility] Cardiologist: Rollene Rotunda, MD   Interval Summary  .    Patient has a few concerns this AM- was told that his oxygen saturation was low overnight, but he did not feel short of breath. No orthopnea. No chest pain. Occasionally has palpitations and can tell that his HR is elevated.   Patient on for TEE guided DCCV today   Vital Signs .    Vitals:   03/08/23 1700 03/08/23 2035 03/09/23 0022 03/09/23 0400  BP: 122/69 107/84 105/64 111/72  Pulse: (!) 106 (!) 106  (!) 102  Resp: (!) 23 (!) 24  (!) 24  Temp:  99.7 F (37.6 C) 99.4 F (37.4 C) 98.5 F (36.9 C)  TempSrc:  Oral  Oral  SpO2: 94% 95%  91%  Weight:      Height:        Intake/Output Summary (Last 24 hours) at 03/09/2023 0750 Last data filed at 03/09/2023 0600 Gross per 24 hour  Intake 497.04 ml  Output 2375 ml  Net -1877.96 ml      03/07/2023   10:27 AM 11/06/2019    8:42 AM 10/08/2019    8:19 AM  Last 3 Weights  Weight (lbs) 208 lb 1.6 oz 194 lb 6 oz 193 lb  Weight (kg) 94.394 kg 88.168 kg 87.544 kg      Telemetry/ECG    Atrial fibrillation, HR in the 100s-120s - Personally Reviewed  Physical Exam .   GEN: No acute distress.  Sitting upright on the side of the bed  Neck: No JVD Cardiac: Irregular rate and rhythm, tachycardic. no murmurs, rubs, or gallops.  Respiratory: Crackles in bilateral lung bases. Normal work of breathing on room air GI: Soft, nontender, non-distended  MS: No edema in BLE   Assessment & Plan .     Paroxysmal atrial fibrillation - Patient previously did have an episode of atrial fibrillation in 2014 in the setting of vertigo/severe vomiting.  He did not have recurrence of A-fib so was not started on anticoagulation or rate controlling medications at that time. - Now patient presenting complaining of an irregular heartbeat, fatigue, dry cough.  Found to be in atrial fibrillation with heart rates up to  the 170s in the ER.  TSH normal.  BNP elevated to 859.5.  Respiratory panel and COVID-negative - Now on cardizem infusion and metoprolol tartrate 25 mg BID- per telemetry, patient remains in afib with HR in the 100s-120s - Echo showed EF 45-50%- stop cardizem due to reduced EF - Increase metoprolol tartrate to 50 mg BID  - Given reduced EF and difficult to control HR, favor TEE guided DCCV this admission. Patient has not eating yet this AM, so able to schedule procedure for this afternoon  - Continue Eliquis 5 mg twice daily-CHA2DS2-VASc 1 (age). 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 BNP elevated to 859.5 on presentation - Echo this admission showed EF 45-50%, normal RV function, mild-moderate MR. Suspect that EF is reduced due to tachycardia/atrial fibrillation  - Received 2 doses of IV lasix yesterday- output 2.3 L urine yesterday. Renal function stable   - Continues to have crackles in lung bases and had hypoxia overnight- continue IV lasix  - Gave K supplementation due  to K 3.4 this AM  - Plan to repeat echocardiogram in 2-3 months after restoration of NSR  - Now on metoprolol tartrate 50 mg BID- plan to consolidate to metoprolol succinate prior to DC. Add additional GDMT as BP tolerates after DCCV    Informed Consent   Shared Decision Making/Informed Consent The risks [stroke, cardiac arrhythmias rarely resulting in the need for a temporary or permanent pacemaker, skin irritation or burns, esophageal damage, perforation (1:10,000 risk), bleeding, pharyngeal hematoma as well as other potential complications associated with conscious sedation including aspiration, arrhythmia, respiratory failure and death], benefits (treatment guidance, restoration of normal sinus rhythm, diagnostic support) and  alternatives of a transesophageal echocardiogram guided cardioversion were discussed in detail with Mr. Pitoniak and he is willing to proceed.      For questions or updates, please contact [facility] Please consult [email or web address] for contact info under        Signed, Jonita Albee, PA-C    Personally seen and examined. Agree with APP above with the following comments: AF and HFrEF- still symptomatic. IRIR rhythm and tachycardia on exam.  Crackles bilaterally. Dullness to percussion on abdomen.  Sat on RA 94% with good wave form. No LE edema. TEE/DCCV today. Still volume up.  Will increase K and IV lasix dose for PM.  Hopefully he will be euvolemic and in rhythm tomorrow for discharge.  Riley Lam, MD FASE [facility] Cardiologist [facility]  [address] [place], [address] [place], [place] [address] [phone]  10:29 AM

## 2023-03-10 DIAGNOSIS — I480 Paroxysmal atrial fibrillation: Secondary | ICD-10-CM | POA: Diagnosis not present

## 2023-03-10 DIAGNOSIS — I5020 Unspecified systolic (congestive) heart failure: Secondary | ICD-10-CM | POA: Diagnosis not present

## 2023-03-10 DIAGNOSIS — I4891 Unspecified atrial fibrillation: Secondary | ICD-10-CM | POA: Diagnosis not present

## 2023-03-10 DIAGNOSIS — I340 Nonrheumatic mitral (valve) insufficiency: Secondary | ICD-10-CM | POA: Diagnosis not present

## 2023-03-10 LAB — CBC
HCT: 35.7 % — ABNORMAL LOW (ref 39.0–52.0)
Hemoglobin: 11.9 g/dL — ABNORMAL LOW (ref 13.0–17.0)
MCH: 30.7 pg (ref 26.0–34.0)
MCHC: 33.3 g/dL (ref 30.0–36.0)
MCV: 92 fL (ref 80.0–100.0)
Platelets: 255 10*3/uL (ref 150–400)
RBC: 3.88 MIL/uL — ABNORMAL LOW (ref 4.22–5.81)
RDW: 12.9 % (ref 11.5–15.5)
WBC: 7.2 10*3/uL (ref 4.0–10.5)
nRBC: 0 % (ref 0.0–0.2)

## 2023-03-10 LAB — BASIC METABOLIC PANEL
Anion gap: 11 (ref 5–15)
BUN: 22 mg/dL (ref 8–23)
CO2: 23 mmol/L (ref 22–32)
Calcium: 8 mg/dL — ABNORMAL LOW (ref 8.9–10.3)
Chloride: 102 mmol/L (ref 98–111)
Creatinine, Ser: 1.32 mg/dL — ABNORMAL HIGH (ref 0.61–1.24)
GFR, Estimated: 57 mL/min — ABNORMAL LOW (ref >60)
Glucose, Bld: 108 mg/dL — ABNORMAL HIGH (ref 70–99)
Potassium: 3.5 mmol/L (ref 3.5–5.1)
Sodium: 136 mmol/L (ref 135–145)

## 2023-03-10 LAB — MAGNESIUM: Magnesium: 2 mg/dL (ref 1.7–2.4)

## 2023-03-10 MED ORDER — METOPROLOL SUCCINATE ER 100 MG PO TB24
100.0000 mg | ORAL_TABLET | Freq: Every day | ORAL | Status: DC
  Administered 2023-03-10 – 2023-03-11 (×2): 100 mg via ORAL
  Filled 2023-03-10 (×2): qty 1

## 2023-03-10 NOTE — Plan of Care (Signed)
Patient making progress toward plan; remains in atrial fibrillation with RVR, rates 110 - 150, sustained on IV amiodarone.  Up in room at bedside, talking with family and nursing without significant problems.  Patient endorses fatigue, but no other symptoms at this time.  Respiratory and circulatory status likely unaffected given current vital signs.  Pending repeat DCCV in AM following unsuccessful TEE/DCCV yesterday.  Will continue to monitor closely.

## 2023-03-10 NOTE — Progress Notes (Signed)
PROGRESS NOTE    Yaden Dralle  UJW:[id number] DOB: June 22, 1951 DOA: 03/07/2023 PCP: Patient, No Pcp Per   Brief Narrative:  72 year old with history of paroxysmal A-fib, GERD comes to the hospital for evaluation of palpitations and fatigue.  In the ER patient was noted to be in atrial fibrillation with RVR therefore admitted to the hospital.  S/p cardioversion x 1 which was unsuccessful.    Assessment & Plan:   Principal Problem:   Atrial fibrillation with RVR (HCC) Active Problems:   Hypokalemia   Normocytic anemia   BPH (benign prostatic hyperplasia)   CAP (community acquired pneumonia)   A-fib (HCC)  Atrial fibrillation with RVR (HCC) Known history of paroxysmal atrial fibrillation not on any anticoagulation at home.  -cardioversion attempted on 8/23 without success.  Patient started on amiodarone drip and rates are still 150 but he is not as symptomatic as he was.  Has been started on Eliquis.  TSH normal.  Waiting for cardiology for further plans.  Multifocal infiltrate with new acute respiratory failure -?volume overload.  Respiratory panel COVID-19 is negative.   -BNP is elevated, procalcitonin negative and so is WBC count so doubt PNA.  Antibiotics discontinued.  Acute pulm edema/acute systolic congestive heart failure: Chest x-ray shows bilateral diffuse pulmonary edema and echo showed EF of 45 to 50%.  He received 1 dose of IV Lasix 60 mg yesterday and currently is on 40 mg IV twice daily.  Good diuresis and net loss of 2.3 L since admission.  Cardiology managing.   Vitamin B12 deficiency - Supplement   Normocytic anemia Borderline low saturation and ferritin.  Hemoglobin overall stable at 11 -supplement   Hypokalemia: Resolved.  DVT prophylaxis: SCDs Start: 03/07/23 1956   Code Status: Full Code  Family Communication:  None present at bedside.  Plan of care discussed with patient in length and he/she verbalized understanding and agreed with it.  Status is:  Inpatient Remains inpatient appropriate because: Still in atrial fibrillation with RVR on amiodarone drip.   Estimated body mass index is 26.72 kg/m as calculated from the following:   Height as of this encounter: 6\' 2"  (1.88 m).   Weight as of this encounter: 94.4 kg.    Nutritional Assessment: Body mass index is 26.72 kg/m.Marland Kitchen Seen by dietician.  I agree with the assessment and plan as outlined below: Nutrition Status:        . Skin Assessment: I have examined the patient's skin and I agree with the wound assessment as performed by the wound care RN as outlined below:    Consultants:  Cardiology  Procedures:  As above  Antimicrobials:  Anti-infectives (From admission, onward)    Start     Dose/Rate Route Frequency Ordered Stop   03/07/23 2030  cefTRIAXone (ROCEPHIN) 1 g in sodium chloride 0.9 % 100 mL IVPB  Status:  Discontinued        1 g 200 mL/hr over 30 Minutes Intravenous Every 24 hours 03/07/23 1929 03/08/23 1415   03/07/23 2030  azithromycin (ZITHROMAX) 500 mg in sodium chloride 0.9 % 250 mL IVPB  Status:  Discontinued        500 mg 250 mL/hr over 60 Minutes Intravenous Every 24 hours 03/07/23 1929 03/08/23 1415         Subjective: Patient seen and examined.  He has intermittent palpitation but no chest pain or shortness of breath.  Overall feels better.  Rates 150s when I saw him this morning.  Objective: Vitals:  03/09/23 2316 03/10/23 0319 03/10/23 0822 03/10/23 0921  BP: 115/71 132/83 112/82 126/82  Pulse: (!) 115 (!) 119  (!) 110  Resp: 16 18    Temp: 98 F (36.7 C) 98.7 F (37.1 C) 98.4 F (36.9 C)   TempSrc: Oral Oral Oral   SpO2: 99% 93% 98% 96%  Weight:      Height:        Intake/Output Summary (Last 24 hours) at 03/10/2023 1054 Last data filed at 03/09/2023 2036 Gross per 24 hour  Intake 351.16 ml  Output 1250 ml  Net -898.84 ml   Filed Weights   03/07/23 1027  Weight: 94.4 kg    Examination:  General exam: Appears calm and  comfortable  Respiratory system: Clear to auscultation. Respiratory effort normal. Cardiovascular system: S1 & S2 heard, irregularly irregular RR. No JVD, murmurs, rubs, gallops or clicks. No pedal edema. Gastrointestinal system: Abdomen is nondistended, soft and nontender. No organomegaly or masses felt. Normal bowel sounds heard. Central nervous system: Alert and oriented. No focal neurological deficits. Extremities: Symmetric 5 x 5 power. Skin: No rashes, lesions or ulcers Psychiatry: Judgement and insight appear normal. Mood & affect appropriate.    Data Reviewed: I have personally reviewed following labs and imaging studies  CBC: Recent Labs  Lab 03/07/23 1030 03/08/23 0505 03/09/23 0438 03/10/23 0310  WBC 8.7 9.2 7.5 7.2  HGB 11.6* 11.0* 10.9* 11.9*  HCT 35.4* 34.8* 33.3* 35.7*  MCV 91.9 96.9 92.0 92.0  PLT 194 195 226 255   Basic Metabolic Panel: Recent Labs  Lab 03/07/23 1030 03/08/23 0505 03/09/23 0438 03/10/23 0310  NA 138 136 136 136  K 3.4* 4.0 3.4* 3.5  CL 107 109 106 102  CO2 24 19* 22 23  GLUCOSE 133* 120* 114* 108*  BUN 21 19 22 22   CREATININE 1.05 0.90 1.01 1.32*  CALCIUM 7.8* 7.9* 8.0* 8.0*  MG 1.8  --  1.9 2.0   GFR: Estimated Creatinine Clearance: 58.8 mL/min (A) (by C-G formula based on SCr of 1.32 mg/dL (H)). Liver Function Tests: Recent Labs  Lab 03/07/23 1211  AST 16  ALT 17  ALKPHOS 83  BILITOT 3.7*  PROT 6.3*  ALBUMIN 3.2*   No results for input(s): "LIPASE", "AMYLASE" in the last 168 hours. No results for input(s): "AMMONIA" in the last 168 hours. Coagulation Profile: Recent Labs  Lab 03/07/23 1030 03/08/23 0505  INR 1.3* 1.5*   Cardiac Enzymes: No results for input(s): "CKTOTAL", "CKMB", "CKMBINDEX", "TROPONINI" in the last 168 hours. BNP (last 3 results) No results for input(s): "PROBNP" in the last 8760 hours. HbA1C: No results for input(s): "HGBA1C" in the last 72 hours. CBG: No results for input(s): "GLUCAP" in the  last 168 hours. Lipid Profile: No results for input(s): "CHOL", "HDL", "LDLCALC", "TRIG", "CHOLHDL", "LDLDIRECT" in the last 72 hours. Thyroid Function Tests: No results for input(s): "TSH", "T4TOTAL", "FREET4", "T3FREE", "THYROIDAB" in the last 72 hours. Anemia Panel: Recent Labs    03/08/23 0505  VITAMINB12 158*  FOLATE 24.3  FERRITIN 102  TIBC 266  IRON 23*  RETICCTPCT 1.6   Sepsis Labs: Recent Labs  Lab 03/08/23 0504  PROCALCITON <0.10    Recent Results (from the past 240 hour(s))  Respiratory (~20 pathogens) panel by PCR     Status: None   Collection Time: 03/07/23  7:23 PM   Specimen: Nasopharyngeal Swab; Respiratory  Result Value Ref Range Status   Adenovirus NOT DETECTED NOT DETECTED Final   Coronavirus 229E NOT  DETECTED NOT DETECTED Final    Comment: (NOTE) The Coronavirus on the Respiratory Panel, DOES NOT test for the novel  Coronavirus (2019 nCoV)    Coronavirus HKU1 NOT DETECTED NOT DETECTED Final   Coronavirus NL63 NOT DETECTED NOT DETECTED Final   Coronavirus OC43 NOT DETECTED NOT DETECTED Final   Metapneumovirus NOT DETECTED NOT DETECTED Final   Rhinovirus / Enterovirus NOT DETECTED NOT DETECTED Final   Influenza A NOT DETECTED NOT DETECTED Final   Influenza B NOT DETECTED NOT DETECTED Final   Parainfluenza Virus 1 NOT DETECTED NOT DETECTED Final   Parainfluenza Virus 2 NOT DETECTED NOT DETECTED Final   Parainfluenza Virus 3 NOT DETECTED NOT DETECTED Final   Parainfluenza Virus 4 NOT DETECTED NOT DETECTED Final   Respiratory Syncytial Virus NOT DETECTED NOT DETECTED Final   Bordetella pertussis NOT DETECTED NOT DETECTED Final   Bordetella Parapertussis NOT DETECTED NOT DETECTED Final   Chlamydophila pneumoniae NOT DETECTED NOT DETECTED Final   Mycoplasma pneumoniae NOT DETECTED NOT DETECTED Final    Comment: Performed at [facility], [address], [place], [place] [address]  SARS Coronavirus 2 by RT PCR (hospital order, performed in [facility] hospital lab) *cepheid single result test* Anterior Nasal Swab     Status: None   Collection Time: 03/07/23  7:23 PM   Specimen: Anterior Nasal Swab  Result Value Ref Range Status   SARS Coronavirus 2 by RT PCR NEGATIVE NEGATIVE Final    Comment: (NOTE) SARS-CoV-2 target nucleic acids are NOT DETECTED.  The SARS-CoV-2 RNA is generally detectable in upper and lower respiratory specimens during the acute phase of infection. The lowest concentration of SARS-CoV-2 viral copies this assay can detect is 250 copies / mL. A negative result does not preclude SARS-CoV-2 infection and should not be used as the sole basis for treatment or other patient management decisions.  A negative result may occur with improper specimen collection / handling, submission of specimen other than nasopharyngeal swab, presence of viral mutation(s) within the areas targeted by this assay, and inadequate number of viral copies (<250 copies / mL). A negative result must be combined with clinical observations, patient history, and epidemiological information.  Fact Sheet for Patients:   RoadLapTop.co.za  Fact Sheet for Healthcare Providers: http://kim-miller.com/  This test is not yet approved or  cleared by the [place] FDA and has been authorized for detection and/or diagnosis of SARS-CoV-2 by FDA under an Emergency Use Authorization (EUA).  This EUA will remain in effect (meaning this test can be used) for the duration of the COVID-19 declaration under Section 564(b)(1) of the Act, 21 U.S.C. section 360bbb-3(b)(1), unless the authorization is terminated or revoked sooner.  Performed at [facility], [address] [place], [place] [address]   Culture, blood (Routine X 2) w Reflex to ID Panel     Status: None (Preliminary result)   Collection Time: 03/07/23  9:23 PM   Specimen: BLOOD RIGHT ARM  Result Value Ref Range Status    Specimen Description   Final    BLOOD RIGHT ARM Performed at [facility], [address] [place], [place] [address]    Special Requests   Final    BOTTLES DRAWN AEROBIC AND ANAEROBIC Blood Culture results may not be optimal due to an inadequate volume of blood received in culture bottles Performed at [facility], [address], [place], [place] [address]    Culture   Final  NO GROWTH 3 DAYS Performed at [facility], [address] [place], [place] [address]    Report Status PENDING  Incomplete  Culture, blood (Routine X 2) w Reflex to ID Panel     Status: None (Preliminary result)   Collection Time: 03/07/23  9:23 PM   Specimen: BLOOD LEFT HAND  Result Value Ref Range Status   Specimen Description   Final    BLOOD LEFT HAND Performed at [facility], [address] [place], [place] [address]    Special Requests   Final    BOTTLES DRAWN AEROBIC AND ANAEROBIC Blood Culture results may not be optimal due to an inadequate volume of blood received in culture bottles Performed at [facility], [address] [place], [place] [address]    Culture   Final    NO GROWTH 3 DAYS Performed at [facility], [address] [place], [place] [address]    Report Status PENDING  Incomplete     Radiology Studies: DG CHEST PORT 1 VIEW  Result Date: 03/09/2023 CLINICAL DATA:  Hypoxia. EXAM: PORTABLE CHEST 1 VIEW COMPARISON:  03/07/2023 FINDINGS: Heart size upper normal. Stable mediastinal contours. No interstitial and septal thickening suspicious for pulmonary edema. There are small bilateral pleural effusions. No pneumothorax or confluent airspace disease. IMPRESSION: Borderline cardiomegaly. New pulmonary edema and small bilateral pleural effusions. Electronically Signed   By: Narda Rutherford M.D.   On: 03/09/2023 19:25   ECHO TEE  Result Date: 03/09/2023    TRANSESOPHOGEAL ECHO REPORT   Patient Name:   LEITH SEARCH [facility] Date  of Exam: 03/09/2023 Medical Rec #:  [id number]        Height:       74.0 in Accession #:    [id number]       Weight:       208.1 lb Date of Birth:  18-Dec-1950        BSA:          2.210 m Patient Age:    71 years         BP:           111/72 mmHg Patient Gender: M                HR:           132 bpm. Exam Location:  Inpatient Procedure: Transesophageal Echo, Color Doppler, Cardiac Doppler and 3D Echo Indications:     Afib  History:         Patient has prior history of Echocardiogram examinations, most                  recent 03/08/2023. Arrythmias:Atrial Fibrillation.  Sonographer:     Milbert Coulter Referring Phys:  [id number] Jonita Albee Diagnosing Phys: Riley Lam MD PROCEDURE: After discussion of the risks and benefits of a TEE, an informed consent was obtained from the patient. The transesophogeal probe was passed without difficulty through the esophogus of the patient. Imaged were obtained with the patient in a left lateral decubitus position. Local oropharyngeal anesthetic was provided with viscous lidocaine. Sedation performed by different physician. The patient was monitored while under deep sedation. Anesthestetic sedation was provided intravenously by Anesthesiology: 190mg  of Propofol, 100mg  of Lidocaine. Image quality was good. The patient's vital signs; including heart rate, blood pressure, and oxygen saturation; remained stable throughout the procedure. The patient developed no complications during  the procedure. An unsuccessful direct current cardioversion was performed  at 200 joules with 2 attempts.  IMPRESSIONS  1. Left ventricular ejection fraction, by estimation, is 45 to 50%. The left ventricle has mildly decreased function. There is severe left ventricular hypertrophy.  2. Right ventricular systolic function is low normal. The right ventricular size is mildly enlarged.  3. Left atrial size was mildly dilated. No left atrial/left atrial appendage thrombus was detected.  4. Right atrial  size was mildly dilated.  5. A small pericardial effusion is present. The pericardial effusion is localized near the right atrium.  6. The mitral valve is normal in structure. Mild to moderate mitral valve regurgitation. No evidence of mitral stenosis.  7. The aortic valve is tricuspid. Aortic valve regurgitation is not visualized. No aortic stenosis is present.  8. Mildly dilated pulmonary artery.  9. The inferior vena cava is normal in size with greater than 50% respiratory variability, suggesting right atrial pressure of 3 mmHg. FINDINGS  Left Ventricle: Left ventricular ejection fraction, by estimation, is 45 to 50%. The left ventricle has mildly decreased function. The left ventricular internal cavity size was normal in size. There is severe left ventricular hypertrophy. Right Ventricle: The right ventricular size is mildly enlarged. No increase in right ventricular wall thickness. Right ventricular systolic function is low normal. Left Atrium: Left atrial size was mildly dilated. No left atrial/left atrial appendage thrombus was detected. Right Atrium: Right atrial size was mildly dilated. Pericardium: A small pericardial effusion is present. The pericardial effusion is localized near the right atrium. Mitral Valve: Atrial functional mitral regurgitation. The mitral valve is normal in structure. Mild to moderate mitral valve regurgitation. No evidence of mitral valve stenosis. Tricuspid Valve: The tricuspid valve is normal in structure. Tricuspid valve regurgitation is trivial. No evidence of tricuspid stenosis. Aortic Valve: The aortic valve is tricuspid. Aortic valve regurgitation is not visualized. No aortic stenosis is present. Pulmonic Valve: The pulmonic valve was normal in structure. Pulmonic valve regurgitation is trivial. Aorta: The aortic root, ascending aorta, aortic arch and descending aorta are all structurally normal, with no evidence of dilitation or obstruction. Pulmonary Artery: The pulmonary  artery is mildly dilated. Venous: The inferior vena cava is normal in size with greater than 50% respiratory variability, suggesting right atrial pressure of 3 mmHg. IAS/Shunts: No atrial level shunt detected by color flow Doppler. Additional Comments: Spectral Doppler performed.  AORTA Ao Asc diam: 2.90 cm Riley Lam MD Electronically signed by Riley Lam MD Signature Date/Time: 03/09/2023/3:33:13 PM    Final    EP STUDY  Result Date: 03/09/2023 See surgical note for result.  ECHOCARDIOGRAM COMPLETE  Result Date: 03/08/2023    ECHOCARDIOGRAM REPORT   Patient Name:   CASIMIRO BLUM [facility] Date of Exam: 03/08/2023 Medical Rec #:  [id number]        Height:       74.0 in Accession #:    [id number]       Weight:       208.1 lb Date of Birth:  22-Jun-1951        BSA:          2.210 m Patient Age:    71 years         BP:           111/68 mmHg Patient Gender: M                HR:           102 bpm. Exam Location:  Inpatient Procedure: 2D Echo, Cardiac Doppler and  Color Doppler Indications:    I48.91* Unspeicified atrial fibrillation  History:        Patient has no prior history of Echocardiogram examinations.                 Abnormal ECG, Arrythmias:Atrial Fibrillation;                 Signs/Symptoms:Dizziness/Lightheadedness, Dyspnea and Shortness                 of Breath. Pneumonia.  Sonographer:    Sheralyn Boatman RDCS Referring Phys: [id number] [facility] GOEL IMPRESSIONS  1. Left ventricular ejection fraction, by estimation, is 45 to 50%. The left ventricle has mildly decreased function. The left ventricle demonstrates global hypokinesis. The left ventricular internal cavity size was mildly to moderately dilated. Left ventricular diastolic function could not be evaluated.  2. Right ventricular systolic function is normal. The right ventricular size is normal. There is moderately elevated pulmonary artery systolic pressure.  3. The mitral valve is grossly normal. Mild to moderate mitral valve regurgitation. No  evidence of mitral stenosis.  4. The aortic valve is tricuspid. There is mild calcification of the aortic valve. Aortic valve regurgitation is not visualized. No aortic stenosis is present.  5. The inferior vena cava is dilated in size with <50% respiratory variability, suggesting right atrial pressure of 15 mmHg. Comparison(s): No prior Echocardiogram. Conclusion(s)/Recommendation(s): Tachycardia throughout study limits sensitivity for detection of focal wall motion abnormalities. FINDINGS  Left Ventricle: Left ventricular ejection fraction, by estimation, is 45 to 50%. The left ventricle has mildly decreased function. The left ventricle demonstrates global hypokinesis. The left ventricular internal cavity size was mildly to moderately dilated. There is borderline left ventricular hypertrophy. Left ventricular diastolic function could not be evaluated due to atrial fibrillation. Left ventricular diastolic function could not be evaluated. Right Ventricle: The right ventricular size is normal. Right vetricular wall thickness was not well visualized. Right ventricular systolic function is normal. There is moderately elevated pulmonary artery systolic pressure. The tricuspid regurgitant velocity is 2.80 m/s, and with an assumed right atrial pressure of 15 mmHg, the estimated right ventricular systolic pressure is 46.4 mmHg. Left Atrium: Left atrial size was normal in size. Right Atrium: Right atrial size was normal in size. Pericardium: Trivial pericardial effusion is present. Mitral Valve: The mitral valve is grossly normal. Mild to moderate mitral valve regurgitation. No evidence of mitral valve stenosis. Tricuspid Valve: The tricuspid valve is normal in structure. Tricuspid valve regurgitation is mild . No evidence of tricuspid stenosis. Aortic Valve: The aortic valve is tricuspid. There is mild calcification of the aortic valve. Aortic valve regurgitation is not visualized. No aortic stenosis is present. Pulmonic  Valve: The pulmonic valve was not well visualized. Pulmonic valve regurgitation is not visualized. No evidence of pulmonic stenosis. Aorta: The aortic root, ascending aorta and aortic arch are all structurally normal, with no evidence of dilitation or obstruction. Venous: The inferior vena cava is dilated in size with less than 50% respiratory variability, suggesting right atrial pressure of 15 mmHg. IAS/Shunts: The atrial septum is grossly normal.  LEFT VENTRICLE PLAX 2D LVIDd:         5.50 cm LVIDs:         4.40 cm LV PW:         1.10 cm LV IVS:        1.10 cm LVOT diam:     2.60 cm LV SV:         73 LV  SV Index:   33 LVOT Area:     5.31 cm  LV Volumes (MOD) LV vol d, MOD A2C: 105.0 ml LV vol d, MOD A4C: 75.4 ml LV vol s, MOD A2C: 56.0 ml LV vol s, MOD A4C: 38.9 ml LV SV MOD A2C:     49.0 ml LV SV MOD A4C:     75.4 ml LV SV MOD BP:      43.9 ml RIGHT VENTRICLE             IVC RV S prime:     12.10 cm/s  IVC diam: 2.50 cm TAPSE (M-mode): 1.9 cm LEFT ATRIUM             Index        RIGHT ATRIUM           Index LA diam:        4.40 cm 1.99 cm/m   RA Area:     15.40 cm LA Vol (A2C):   53.4 ml 24.16 ml/m  RA Volume:   42.50 ml  19.23 ml/m LA Vol (A4C):   65.8 ml 29.77 ml/m LA Biplane Vol: 59.2 ml 26.78 ml/m  AORTIC VALVE LVOT Vmax:   93.60 cm/s LVOT Vmean:  62.600 cm/s LVOT VTI:    0.138 m  AORTA Ao Root diam: 3.50 cm Ao Asc diam:  3.50 cm MITRAL VALVE               TRICUSPID VALVE MV Area (PHT): 3.03 cm    TR Peak grad:   31.4 mmHg MV Decel Time: 250 msec    TR Vmax:        280.00 cm/s MV E velocity: 94.10 cm/s                            SHUNTS                            Systemic VTI:  0.14 m                            Systemic Diam: 2.60 cm Jodelle Red MD Electronically signed by Jodelle Red MD Signature Date/Time: 03/08/2023/5:13:55 PM    Final     Scheduled Meds:  acidophilus  2 capsule Oral Daily   apixaban  5 mg Oral BID   docusate sodium  100 mg Oral BID   ferrous sulfate  325 mg  Oral Q breakfast   furosemide  40 mg Intravenous BID   metoprolol tartrate  50 mg Oral BID   sodium chloride flush  3 mL Intravenous Q12H   vitamin B-12  500 mcg Oral Daily   Continuous Infusions:  amiodarone 30 mg/hr (03/10/23 0015)     LOS: 3 days   Hughie Closs, MD [facility]  03/10/2023, 10:54 AM   *Please note that this is a verbal dictation therefore any spelling or grammatical errors are due to the "Dragon Medical One" system interpretation.  Please page via Amion and do not message via secure chat for urgent patient care matters. Secure chat can be used for non urgent patient care matters.  How to contact the [facility] Attending or Consulting provider 7A - 7P or covering provider during after hours 7P -7A, for this patient?  Check the care team in [facility] and look for a) attending/consulting [facility] provider listed and b)  the [facility] team listed. Page or secure chat 7A-7P. Log into [email or web address] and use [place] universal password to access. If you do not have the password, please contact the hospital operator. Locate the [facility] provider you are looking for under [facility] and page to a number that you can be directly reached. If you still have difficulty reaching the provider, please page the [facility] (Director on Call) for the Hospitalists listed on amion for assistance.

## 2023-03-10 NOTE — Progress Notes (Signed)
   Patient Name: Kevin Doyle Date of Encounter: 03/10/2023 [facility] Cardiologist: Rollene Rotunda, MD   Interval Summary  .    AF RVR still present.  Feels much better  Vital Signs .    Vitals:   03/09/23 2316 03/10/23 0319 03/10/23 0822 03/10/23 0921  BP: 115/71 132/83 112/82 126/82  Pulse: (!) 115 (!) 119  (!) 110  Resp: 16 18    Temp: 98 F (36.7 C) 98.7 F (37.1 C) 98.4 F (36.9 C)   TempSrc: Oral Oral Oral   SpO2: 99% 93% 98% 96%  Weight:      Height:        Intake/Output Summary (Last 24 hours) at 03/10/2023 1126 Last data filed at 03/09/2023 2036 Gross per 24 hour  Intake 351.16 ml  Output 1250 ml  Net -898.84 ml      03/07/2023   10:27 AM 11/06/2019    8:42 AM 10/08/2019    8:19 AM  Last 3 Weights  Weight (lbs) 208 lb 1.6 oz 194 lb 6 oz 193 lb  Weight (kg) 94.394 kg 88.168 kg 87.544 kg      Telemetry/ECG    Atrial fibrillation, HR in the 100s-120s - Personally Reviewed  Physical Exam .    GEN: No acute distress. Neck: No JVD Cardiac: Irregular rate and rhythm, tachycardic. no  rubs, or gallops. Holosystolic murmur Respiratory: Clear to auscultation bilaterally, Normal work of breathing on room air  GI: Soft, nontender, non-distended  MS: No edema in BLE   Assessment & Plan .     Paroxysmal atrial fibrillation - s/p DCCV with return to AF; no LAA thrombus on TEE - continue amiodarone -  BB to succinate at night - Continue Eliquis 5 mg twice daily-CHA2DS2-VASc 1 (age). 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

## 2023-03-11 ENCOUNTER — Inpatient Hospital Stay (HOSPITAL_COMMUNITY): Payer: Medicare Other

## 2023-03-11 DIAGNOSIS — I4891 Unspecified atrial fibrillation: Secondary | ICD-10-CM | POA: Diagnosis not present

## 2023-03-11 DIAGNOSIS — I340 Nonrheumatic mitral (valve) insufficiency: Secondary | ICD-10-CM | POA: Diagnosis not present

## 2023-03-11 DIAGNOSIS — I5020 Unspecified systolic (congestive) heart failure: Secondary | ICD-10-CM | POA: Diagnosis not present

## 2023-03-11 DIAGNOSIS — I480 Paroxysmal atrial fibrillation: Secondary | ICD-10-CM | POA: Diagnosis not present

## 2023-03-11 LAB — CBC
HCT: 36.9 % — ABNORMAL LOW (ref 39.0–52.0)
Hemoglobin: 12.4 g/dL — ABNORMAL LOW (ref 13.0–17.0)
MCH: 30.3 pg (ref 26.0–34.0)
MCHC: 33.6 g/dL (ref 30.0–36.0)
MCV: 90.2 fL (ref 80.0–100.0)
Platelets: 344 10*3/uL (ref 150–400)
RBC: 4.09 MIL/uL — ABNORMAL LOW (ref 4.22–5.81)
RDW: 12.7 % (ref 11.5–15.5)
WBC: 12.8 10*3/uL — ABNORMAL HIGH (ref 4.0–10.5)
nRBC: 0 % (ref 0.0–0.2)

## 2023-03-11 LAB — PROCALCITONIN: Procalcitonin: 0.12 ng/mL

## 2023-03-11 LAB — BASIC METABOLIC PANEL
Anion gap: 13 (ref 5–15)
BUN: 24 mg/dL — ABNORMAL HIGH (ref 8–23)
CO2: 21 mmol/L — ABNORMAL LOW (ref 22–32)
Calcium: 8.3 mg/dL — ABNORMAL LOW (ref 8.9–10.3)
Chloride: 103 mmol/L (ref 98–111)
Creatinine, Ser: 1.64 mg/dL — ABNORMAL HIGH (ref 0.61–1.24)
GFR, Estimated: 44 mL/min — ABNORMAL LOW (ref >60)
Glucose, Bld: 128 mg/dL — ABNORMAL HIGH (ref 70–99)
Potassium: 3.7 mmol/L (ref 3.5–5.1)
Sodium: 137 mmol/L (ref 135–145)

## 2023-03-11 LAB — MAGNESIUM: Magnesium: 1.7 mg/dL (ref 1.7–2.4)

## 2023-03-11 MED ORDER — ZOLPIDEM TARTRATE 5 MG PO TABS
5.0000 mg | ORAL_TABLET | Freq: Every evening | ORAL | Status: AC | PRN
  Administered 2023-03-11: 5 mg via ORAL
  Filled 2023-03-11: qty 1

## 2023-03-11 MED ORDER — FUROSEMIDE 40 MG PO TABS: 40.0000 mg | ORAL_TABLET | Freq: Every day | ORAL | Status: DC

## 2023-03-11 MED ORDER — FUROSEMIDE 10 MG/ML IJ SOLN
40.0000 mg | Freq: Once | INTRAMUSCULAR | Status: AC
  Administered 2023-03-11: 40 mg via INTRAVENOUS
  Filled 2023-03-11: qty 4

## 2023-03-11 MED ORDER — IBUPROFEN 600 MG PO TABS
600.0000 mg | ORAL_TABLET | Freq: Once | ORAL | Status: AC
  Administered 2023-03-11: 600 mg via ORAL
  Filled 2023-03-11: qty 1

## 2023-03-11 NOTE — Progress Notes (Signed)
Late entry...  8/25/224  @ 05:30  C/o 8/10 pain at xyphoid process when taking deep breath that pt describes as feeling similar to a pulled muscle. EKG obtained. BP 113/63. HR 137. Oral temp 100.2. O2 sat 93% RA. Pt states that he feels as though he has been getting sick over the last few days. PRN tylenol given. Opyd, MD notified via secure chat. Per response from Opyd, since he's stable, follow up with his attending this AM. Dayshift RN, Jon Gills updated

## 2023-03-11 NOTE — H&P (View-Only) (Signed)
   Patient Name: Kevin Doyle Date of Encounter: 03/11/2023 [facility] Cardiologist: Rollene Rotunda, MD   Interval Summary  .    Last PM had new, worsening chest pain with low grade fever. Worsening SOB [name] notes that he was bitten by a tick and is worried for Alpha Gal, Lyme, and other tick borne illnesses  Vital Signs .    Vitals:   03/10/23 1952 03/10/23 2002 03/10/23 2330 03/11/23 0438  BP: 115/79  105/78 113/63  Pulse: (!) 131 (!) 141 (!) 135   Resp: 19  17   Temp: 99 F (37.2 C)  99.9 F (37.7 C) 100.2 F (37.9 C)  TempSrc: Oral  Oral   SpO2: 94%  93%   Weight:      Height:        Intake/Output Summary (Last 24 hours) at 03/11/2023 0801 Last data filed at 03/10/2023 2330 Gross per 24 hour  Intake 3 ml  Output 900 ml  Net -897 ml      03/07/2023   10:27 AM 11/06/2019    8:42 AM 10/08/2019    8:19 AM  Last 3 Weights  Weight (lbs) 208 lb 1.6 oz 194 lb 6 oz 193 lb  Weight (kg) 94.394 kg 88.168 kg 87.544 kg      Telemetry/ECG    Atrial fibrillation, HR in the 120s 140s - Personally Reviewed  Physical Exam .    GEN: No acute distress. Neck: No JVD Cardiac: Irregular rate and rhythm, tachycardic. no  rubs, or gallops. Holosystolic murmur Respiratory: bilateral crackles Normal work of breathing on room air  GI: Soft, nontender, distended with fluid wave MS: No edema in BLE  Integument: No rash  Assessment & Plan .     Paroxysmal atrial fibrillation - s/p DCCV with return to AF; no LAA thrombus on TEE - continue amiodarone - continue succinate - Continue Eliquis 5 mg twice daily-CHA2DS2-VASc 1 (age). 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, Stage C, AF related suspected -  BB as above  -Creatinine bump today -  given is crackles, will give 40 IV X1, will get BNP tomorrow  Low grade Fever - seen with primary, a Pro-calcitonin is ordered - No rash consistent with early Lyme, RMSF,  - retic count, LDH, and haptoglobin in AMB - [name] feels strongly about him getting ELISA testing for Lyme; I do not know the capability of [facility] inpatient testing       For questions or updates, please contact [facility] Please consult [email or web address] for contact info under    Riley Lam, MD FASE [facility] Cardiologist [facility]  [address] [place], [address] [place], [place] [address] [phone]  8:01 AM

## 2023-03-11 NOTE — Progress Notes (Signed)
PROGRESS NOTE    Kevin Doyle  YQM:[id number] DOB: 1951/07/09 DOA: 03/07/2023 PCP: Patient, No Pcp Per   Brief Narrative:  72 year old with history of paroxysmal A-fib, GERD comes to the hospital for evaluation of palpitations and fatigue.  In the ER patient was noted to be in atrial fibrillation with RVR therefore admitted to the hospital.  S/p cardioversion x 1 which was unsuccessful.    Assessment & Plan:   Principal Problem:   Atrial fibrillation with RVR (HCC) Active Problems:   Hypokalemia   Normocytic anemia   BPH (benign prostatic hyperplasia)   CAP (community acquired pneumonia)   A-fib (HCC)  Atrial fibrillation with RVR (HCC) Known history of paroxysmal atrial fibrillation not on any anticoagulation at home.  -cardioversion attempted on 8/23 without success.  Patient started on amiodarone drip and rates are still around 130.  He is complaining of chest pain with deep inspiration.  Did have some chest pain early morning.  Does not complain of shortness of breath.  He is on Eliquis.  Cardiology following.  He is tentatively scheduled for DCCV tomorrow.  Multifocal infiltrate with new acute respiratory failure -?volume overload.  Respiratory panel COVID-19 is negative.   -BNP is elevated, procalcitonin negative and so is WBC count so initially doubted pneumonia and antibiotics were discontinued.  He is now complaining of some cough for last few days, chest pain with deep inspiration and had low-grade temperature of 100.2 early morning.  Will recheck procalcitonin, he is already ruled out of respiratory viral panel.    Acute pulm edema/acute systolic congestive heart failure: Chest x-ray shows bilateral diffuse pulmonary edema and echo showed EF of 45 to 50%.  He received 1 dose of IV Lasix 60 mg yesterday and was started on 40 mg IV Lasix twice daily.  His creatinine jumped today, cardiology is giving him only 1 dose now.  Reassess creatinine tomorrow.   Vitamin B12  deficiency - Supplement  AKI: Creatinine rising and currently 1.64.  Likely due to ATN secondary to Lasix and hypotension.  Cardiology holding Lasix and will avoid nephrotoxic events.    Normocytic anemia Borderline low saturation and ferritin.  Hemoglobin overall stable at 11 -supplement   Hypokalemia: Resolved.  DVT prophylaxis: SCDs Start: 03/07/23 1956 Eliquis   Code Status: Full Code  Family Communication:  None present at bedside.  Plan of care discussed with patient in length and he/she verbalized understanding and agreed with it.  Also discussed with his daughter Raynelle Fanning over the phone.  Status is: Inpatient Remains inpatient appropriate because: Still in atrial fibrillation with RVR on amiodarone drip.   Estimated body mass index is 26.72 kg/m as calculated from the following:   Height as of this encounter: 6\' 2"  (1.88 m).   Weight as of this encounter: 94.4 kg.    Nutritional Assessment: Body mass index is 26.72 kg/m.Marland Kitchen Seen by dietician.  I agree with the assessment and plan as outlined below: Nutrition Status:        . Skin Assessment: I have examined the patient's skin and I agree with the wound assessment as performed by the wound care RN as outlined below:    Consultants:  Cardiology  Procedures:  As above  Antimicrobials:  Anti-infectives (From admission, onward)    Start     Dose/Rate Route Frequency Ordered Stop   03/07/23 2030  cefTRIAXone (ROCEPHIN) 1 g in sodium chloride 0.9 % 100 mL IVPB  Status:  Discontinued  1 g 200 mL/hr over 30 Minutes Intravenous Every 24 hours 03/07/23 1929 03/08/23 1415   03/07/23 2030  azithromycin (ZITHROMAX) 500 mg in sodium chloride 0.9 % 250 mL IVPB  Status:  Discontinued        500 mg 250 mL/hr over 60 Minutes Intravenous Every 24 hours 03/07/23 1929 03/08/23 1415         Subjective: Patient seen and examined.  Complains of chest pain with deep inspiration and some cough which is nonproductive.   Low-grade fever overnight.  Objective: Vitals:   03/10/23 2002 03/10/23 2330 03/11/23 0438 03/11/23 0807  BP:  105/78 113/63 (!) 89/55  Pulse: (!) 141 (!) 135 95 (!) 134  Resp:  17 20 19   Temp:  99.9 F (37.7 C) 100.2 F (37.9 C) 97.8 F (36.6 C)  TempSrc:  Oral Oral Oral  SpO2:  93% 93% 96%  Weight:      Height:        Intake/Output Summary (Last 24 hours) at 03/11/2023 1039 Last data filed at 03/11/2023 0645 Gross per 24 hour  Intake 1358.57 ml  Output 1550 ml  Net -191.43 ml   Filed Weights   03/07/23 1027  Weight: 94.4 kg    Examination:  General exam: Appears anxious Respiratory system: Clear to auscultation. Respiratory effort normal. Cardiovascular system: S1 & S2 heard, irregularly irregular RR. No JVD, murmurs, rubs, gallops or clicks. No pedal edema. Gastrointestinal system: Abdomen is nondistended, soft and nontender. No organomegaly or masses felt. Normal bowel sounds heard. Central nervous system: Alert and oriented. No focal neurological deficits. Extremities: Symmetric 5 x 5 power. Skin: No rashes, lesions or ulcers.  Psychiatry: Judgement and insight appear normal. Mood & affect appropriate.   Data Reviewed: I have personally reviewed following labs and imaging studies  CBC: Recent Labs  Lab 03/07/23 1030 03/08/23 0505 03/09/23 0438 03/10/23 0310 03/11/23 0616  WBC 8.7 9.2 7.5 7.2 12.8*  HGB 11.6* 11.0* 10.9* 11.9* 12.4*  HCT 35.4* 34.8* 33.3* 35.7* 36.9*  MCV 91.9 96.9 92.0 92.0 90.2  PLT 194 195 226 255 344   Basic Metabolic Panel: Recent Labs  Lab 03/07/23 1030 03/08/23 0505 03/09/23 0438 03/10/23 0310 03/11/23 0616  NA 138 136 136 136 137  K 3.4* 4.0 3.4* 3.5 3.7  CL 107 109 106 102 103  CO2 24 19* 22 23 21*  GLUCOSE 133* 120* 114* 108* 128*  BUN 21 19 22 22  24*  CREATININE 1.05 0.90 1.01 1.32* 1.64*  CALCIUM 7.8* 7.9* 8.0* 8.0* 8.3*  MG 1.8  --  1.9 2.0 1.7   GFR: Estimated Creatinine Clearance: 47.3 mL/min (A) (by C-G  formula based on SCr of 1.64 mg/dL (H)). Liver Function Tests: Recent Labs  Lab 03/07/23 1211  AST 16  ALT 17  ALKPHOS 83  BILITOT 3.7*  PROT 6.3*  ALBUMIN 3.2*   No results for input(s): "LIPASE", "AMYLASE" in the last 168 hours. No results for input(s): "AMMONIA" in the last 168 hours. Coagulation Profile: Recent Labs  Lab 03/07/23 1030 03/08/23 0505  INR 1.3* 1.5*   Cardiac Enzymes: No results for input(s): "CKTOTAL", "CKMB", "CKMBINDEX", "TROPONINI" in the last 168 hours. BNP (last 3 results) No results for input(s): "PROBNP" in the last 8760 hours. HbA1C: No results for input(s): "HGBA1C" in the last 72 hours. CBG: No results for input(s): "GLUCAP" in the last 168 hours. Lipid Profile: No results for input(s): "CHOL", "HDL", "LDLCALC", "TRIG", "CHOLHDL", "LDLDIRECT" in the last 72 hours. Thyroid Function Tests:  No results for input(s): "TSH", "T4TOTAL", "FREET4", "T3FREE", "THYROIDAB" in the last 72 hours. Anemia Panel: No results for input(s): "VITAMINB12", "FOLATE", "FERRITIN", "TIBC", "IRON", "RETICCTPCT" in the last 72 hours.  Sepsis Labs: Recent Labs  Lab 03/08/23 0504  PROCALCITON <0.10    Recent Results (from the past 240 hour(s))  Respiratory (~20 pathogens) panel by PCR     Status: None   Collection Time: 03/07/23  7:23 PM   Specimen: Nasopharyngeal Swab; Respiratory  Result Value Ref Range Status   Adenovirus NOT DETECTED NOT DETECTED Final   Coronavirus 229E NOT DETECTED NOT DETECTED Final    Comment: (NOTE) The Coronavirus on the Respiratory Panel, DOES NOT test for the novel  Coronavirus (2019 nCoV)    Coronavirus HKU1 NOT DETECTED NOT DETECTED Final   Coronavirus NL63 NOT DETECTED NOT DETECTED Final   Coronavirus OC43 NOT DETECTED NOT DETECTED Final   Metapneumovirus NOT DETECTED NOT DETECTED Final   Rhinovirus / Enterovirus NOT DETECTED NOT DETECTED Final   Influenza A NOT DETECTED NOT DETECTED Final   Influenza B NOT DETECTED NOT  DETECTED Final   Parainfluenza Virus 1 NOT DETECTED NOT DETECTED Final   Parainfluenza Virus 2 NOT DETECTED NOT DETECTED Final   Parainfluenza Virus 3 NOT DETECTED NOT DETECTED Final   Parainfluenza Virus 4 NOT DETECTED NOT DETECTED Final   Respiratory Syncytial Virus NOT DETECTED NOT DETECTED Final   Bordetella pertussis NOT DETECTED NOT DETECTED Final   Bordetella Parapertussis NOT DETECTED NOT DETECTED Final   Chlamydophila pneumoniae NOT DETECTED NOT DETECTED Final   Mycoplasma pneumoniae NOT DETECTED NOT DETECTED Final    Comment: Performed at [facility], [address] [place], [place] [address]  SARS Coronavirus 2 by RT PCR (hospital order, performed in [facility] hospital lab) *cepheid single result test* Anterior Nasal Swab     Status: None   Collection Time: 03/07/23  7:23 PM   Specimen: Anterior Nasal Swab  Result Value Ref Range Status   SARS Coronavirus 2 by RT PCR NEGATIVE NEGATIVE Final    Comment: (NOTE) SARS-CoV-2 target nucleic acids are NOT DETECTED.  The SARS-CoV-2 RNA is generally detectable in upper and lower respiratory specimens during the acute phase of infection. The lowest concentration of SARS-CoV-2 viral copies this assay can detect is 250 copies / mL. A negative result does not preclude SARS-CoV-2 infection and should not be used as the sole basis for treatment or other patient management decisions.  A negative result may occur with improper specimen collection / handling, submission of specimen other than nasopharyngeal swab, presence of viral mutation(s) within the areas targeted by this assay, and inadequate number of viral copies (<250 copies / mL). A negative result must be combined with clinical observations, patient history, and epidemiological information.  Fact Sheet for Patients:   RoadLapTop.co.za  Fact Sheet for Healthcare Providers: http://kim-miller.com/  This test is not yet  approved or  cleared by the [place] FDA and has been authorized for detection and/or diagnosis of SARS-CoV-2 by FDA under an Emergency Use Authorization (EUA).  This EUA will remain in effect (meaning this test can be used) for the duration of the COVID-19 declaration under Section 564(b)(1) of the Act, 21 U.S.C. section 360bbb-3(b)(1), unless the authorization is terminated or revoked sooner.  Performed at [facility], [address] [place], [place] [address]   Culture, blood (Routine X 2) w Reflex to ID Panel     Status: None (Preliminary result)   Collection  Time: 03/07/23  9:23 PM   Specimen: BLOOD RIGHT ARM  Result Value Ref Range Status   Specimen Description   Final    BLOOD RIGHT ARM Performed at [facility], [address], [place], [place] [address]    Special Requests   Final    BOTTLES DRAWN AEROBIC AND ANAEROBIC Blood Culture results may not be optimal due to an inadequate volume of blood received in culture bottles Performed at [facility], [address], [place], [place] [address]    Culture   Final    NO GROWTH 4 DAYS Performed at [facility], [address], [place], [place] [address]    Report Status PENDING  Incomplete  Culture, blood (Routine X 2) w Reflex to ID Panel     Status: None (Preliminary result)   Collection Time: 03/07/23  9:23 PM   Specimen: BLOOD LEFT HAND  Result Value Ref Range Status   Specimen Description   Final    BLOOD LEFT HAND Performed at [facility], [address], [place], [place] [address]    Special Requests   Final    BOTTLES DRAWN AEROBIC AND ANAEROBIC Blood Culture results may not be optimal due to an inadequate volume of blood received in culture bottles Performed at [facility], [address], [place], [place] [address]    Culture   Final    NO GROWTH 4 DAYS Performed at [facility], [address], [place], [place]  [address]    Report Status PENDING  Incomplete     Radiology Studies: DG CHEST PORT 1 VIEW  Result Date: 03/09/2023 CLINICAL DATA:  Hypoxia. EXAM: PORTABLE CHEST 1 VIEW COMPARISON:  03/07/2023 FINDINGS: Heart size upper normal. Stable mediastinal contours. No interstitial and septal thickening suspicious for pulmonary edema. There are small bilateral pleural effusions. No pneumothorax or confluent airspace disease. IMPRESSION: Borderline cardiomegaly. New pulmonary edema and small bilateral pleural effusions. Electronically Signed   By: Narda Rutherford M.D.   On: 03/09/2023 19:25   ECHO TEE  Result Date: 03/09/2023    TRANSESOPHOGEAL ECHO REPORT   Patient Name:   GARLAN ALBARES [facility] Date of Exam: 03/09/2023 Medical Rec #:  [id number]        Height:       74.0 in Accession #:    [id number]       Weight:       208.1 lb Date of Birth:  1951-05-17        BSA:          2.210 m Patient Age:    71 years         BP:           111/72 mmHg Patient Gender: M                HR:           132 bpm. Exam Location:  Inpatient Procedure: Transesophageal Echo, Color Doppler, Cardiac Doppler and 3D Echo Indications:     Afib  History:         Patient has prior history of Echocardiogram examinations, most                  recent 03/08/2023. Arrythmias:Atrial Fibrillation.  Sonographer:     Milbert Coulter Referring Phys:  [id number] Jonita Albee Diagnosing Phys: Riley Lam MD PROCEDURE: After discussion of the risks and benefits of a TEE, an informed consent was  obtained from the patient. The transesophogeal probe was passed without difficulty through the esophogus of the patient. Imaged were obtained with the patient in a left lateral decubitus position. Local oropharyngeal anesthetic was provided with viscous lidocaine. Sedation performed by different physician. The patient was monitored while under deep sedation. Anesthestetic sedation was provided intravenously by Anesthesiology: 190mg  of Propofol, 100mg  of Lidocaine.  Image quality was good. The patient's vital signs; including heart rate, blood pressure, and oxygen saturation; remained stable throughout the procedure. The patient developed no complications during  the procedure. An unsuccessful direct current cardioversion was performed at 200 joules with 2 attempts.  IMPRESSIONS  1. Left ventricular ejection fraction, by estimation, is 45 to 50%. The left ventricle has mildly decreased function. There is severe left ventricular hypertrophy.  2. Right ventricular systolic function is low normal. The right ventricular size is mildly enlarged.  3. Left atrial size was mildly dilated. No left atrial/left atrial appendage thrombus was detected.  4. Right atrial size was mildly dilated.  5. A small pericardial effusion is present. The pericardial effusion is localized near the right atrium.  6. The mitral valve is normal in structure. Mild to moderate mitral valve regurgitation. No evidence of mitral stenosis.  7. The aortic valve is tricuspid. Aortic valve regurgitation is not visualized. No aortic stenosis is present.  8. Mildly dilated pulmonary artery.  9. The inferior vena cava is normal in size with greater than 50% respiratory variability, suggesting right atrial pressure of 3 mmHg. FINDINGS  Left Ventricle: Left ventricular ejection fraction, by estimation, is 45 to 50%. The left ventricle has mildly decreased function. The left ventricular internal cavity size was normal in size. There is severe left ventricular hypertrophy. Right Ventricle: The right ventricular size is mildly enlarged. No increase in right ventricular wall thickness. Right ventricular systolic function is low normal. Left Atrium: Left atrial size was mildly dilated. No left atrial/left atrial appendage thrombus was detected. Right Atrium: Right atrial size was mildly dilated. Pericardium: A small pericardial effusion is present. The pericardial effusion is localized near the right atrium. Mitral Valve:  Atrial functional mitral regurgitation. The mitral valve is normal in structure. Mild to moderate mitral valve regurgitation. No evidence of mitral valve stenosis. Tricuspid Valve: The tricuspid valve is normal in structure. Tricuspid valve regurgitation is trivial. No evidence of tricuspid stenosis. Aortic Valve: The aortic valve is tricuspid. Aortic valve regurgitation is not visualized. No aortic stenosis is present. Pulmonic Valve: The pulmonic valve was normal in structure. Pulmonic valve regurgitation is trivial. Aorta: The aortic root, ascending aorta, aortic arch and descending aorta are all structurally normal, with no evidence of dilitation or obstruction. Pulmonary Artery: The pulmonary artery is mildly dilated. Venous: The inferior vena cava is normal in size with greater than 50% respiratory variability, suggesting right atrial pressure of 3 mmHg. IAS/Shunts: No atrial level shunt detected by color flow Doppler. Additional Comments: Spectral Doppler performed.  AORTA Ao Asc diam: 2.90 cm Riley Lam MD Electronically signed by Riley Lam MD Signature Date/Time: 03/09/2023/3:33:13 PM    Final    EP STUDY  Result Date: 03/09/2023 See surgical note for result.   Scheduled Meds:  acidophilus  2 capsule Oral Daily   apixaban  5 mg Oral BID   docusate sodium  100 mg Oral BID   ferrous sulfate  325 mg Oral Q breakfast   furosemide  40 mg Intravenous Once   metoprolol succinate  100 mg Oral QHS   sodium chloride flush  3 mL Intravenous Q12H   vitamin B-12  500 mcg Oral Daily   Continuous Infusions:  amiodarone 30 mg/hr (03/10/23 2359)     LOS: 4 days   Hughie Closs, MD [facility]  03/11/2023, 10:39 AM   *Please note that this is a verbal dictation therefore any spelling or grammatical errors are due to the "Dragon Medical One" system interpretation.  Please page via Amion and do not message via secure chat for urgent patient care matters. Secure chat can be  used for non urgent patient care matters.  How to contact the [facility] Attending or Consulting provider 7A - 7P or covering provider during after hours 7P -7A, for this patient?  Check the care team in [facility] and look for a) attending/consulting [facility] provider listed and b) the [facility] team listed. Page or secure chat 7A-7P. Log into [email or web address] and use [facility] universal password to access. If you do not have the password, please contact the hospital operator. Locate the [facility] provider you are looking for under [facility] and page to a number that you can be directly reached. If you still have difficulty reaching the provider, please page the [facility] (Director on Call) for the Hospitalists listed on amion for assistance.

## 2023-03-11 NOTE — Progress Notes (Signed)
   Patient Name: Kevin Doyle Date of Encounter: 03/11/2023 [facility] Cardiologist: Rollene Rotunda, MD   Interval Summary  .    Last PM had new, worsening chest pain with low grade fever. Worsening SOB [name] notes that he was bitten by a tick and is worried for Kevin Doyle, Kevin Doyle, and other tick borne illnesses  Vital Signs .    Vitals:   03/10/23 1952 03/10/23 2002 03/10/23 2330 03/11/23 0438  BP: 115/79  105/78 113/63  Pulse: (!) 131 (!) 141 (!) 135   Resp: 19  17   Temp: 99 F (37.2 C)  99.9 F (37.7 C) 100.2 F (37.9 C)  TempSrc: Oral  Oral   SpO2: 94%  93%   Weight:      Height:        Intake/Output Summary (Last 24 hours) at 03/11/2023 0801 Last data filed at 03/10/2023 2330 Gross per 24 hour  Intake 3 ml  Output 900 ml  Net -897 ml      03/07/2023   10:27 AM 11/06/2019    8:42 AM 10/08/2019    8:19 AM  Last 3 Weights  Weight (lbs) 208 lb 1.6 oz 194 lb 6 oz 193 lb  Weight (kg) 94.394 kg 88.168 kg 87.544 kg      Telemetry/ECG    Atrial fibrillation, HR in the 120s 140s - Personally Reviewed  Physical Exam .    GEN: No acute distress. Neck: No JVD Cardiac: Irregular rate and rhythm, tachycardic. no  rubs, or gallops. Holosystolic murmur Respiratory: bilateral crackles Normal work of breathing on room air  GI: Soft, nontender, distended with fluid wave MS: No edema in BLE  Integument: No rash  Assessment & Plan .     Paroxysmal atrial fibrillation - s/p DCCV with return to AF; no LAA thrombus on TEE - continue amiodarone - continue succinate - Continue Eliquis 5 mg twice daily-CHA2DS2-VASc 1 (age). 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 -  BB as above  -Creatinine bump today -  given is crackles, will give 40 IV X1, will get BNP tomorrow  Low grade Fever - seen with primary, a Pro-calcitonin is ordered - No rash consistent with early Kevin Doyle, Kevin Doyle,  - retic count, LDH, and haptoglobin in AMB - [name] feels strongly about him getting ELISA testing for Kevin Doyle; I do not know the capability of [facility] inpatient testing       For questions or updates, please contact [facility] Please consult [email or web address] for contact info under    Kevin Lam, MD FASE [facility] Cardiologist [facility]  [address] [place], [address] [place], [place] [address] [phone]  8:01 AM

## 2023-03-12 ENCOUNTER — Encounter (HOSPITAL_COMMUNITY): Payer: Self-pay | Admitting: Internal Medicine

## 2023-03-12 ENCOUNTER — Inpatient Hospital Stay (HOSPITAL_COMMUNITY): Payer: Medicare Other | Admitting: Anesthesiology

## 2023-03-12 ENCOUNTER — Other Ambulatory Visit (HOSPITAL_COMMUNITY): Payer: Self-pay

## 2023-03-12 ENCOUNTER — Other Ambulatory Visit: Payer: Self-pay

## 2023-03-12 ENCOUNTER — Telehealth: Payer: Self-pay | Admitting: Cardiology

## 2023-03-12 ENCOUNTER — Encounter (HOSPITAL_COMMUNITY)
Admission: EM | Disposition: A | Discharge status: Home / Self Care | Payer: Self-pay | Source: Home / Self Care | Attending: Family Medicine

## 2023-03-12 ENCOUNTER — Other Ambulatory Visit: Payer: Self-pay | Admitting: Cardiology

## 2023-03-12 DIAGNOSIS — N179 Acute kidney failure, unspecified: Secondary | ICD-10-CM

## 2023-03-12 DIAGNOSIS — I4891 Unspecified atrial fibrillation: Secondary | ICD-10-CM | POA: Diagnosis not present

## 2023-03-12 DIAGNOSIS — I110 Hypertensive heart disease with heart failure: Secondary | ICD-10-CM | POA: Diagnosis not present

## 2023-03-12 DIAGNOSIS — I340 Nonrheumatic mitral (valve) insufficiency: Secondary | ICD-10-CM | POA: Diagnosis not present

## 2023-03-12 DIAGNOSIS — I509 Heart failure, unspecified: Secondary | ICD-10-CM

## 2023-03-12 DIAGNOSIS — Z87891 Personal history of nicotine dependence: Secondary | ICD-10-CM

## 2023-03-12 DIAGNOSIS — I5020 Unspecified systolic (congestive) heart failure: Secondary | ICD-10-CM | POA: Diagnosis not present

## 2023-03-12 DIAGNOSIS — I480 Paroxysmal atrial fibrillation: Secondary | ICD-10-CM | POA: Diagnosis not present

## 2023-03-12 HISTORY — PX: CARDIOVERSION: SHX1299

## 2023-03-12 LAB — RETICULOCYTES
Immature Retic Fract: 6.6 % (ref 2.3–15.9)
RBC.: 3.52 MIL/uL — ABNORMAL LOW (ref 4.22–5.81)
Retic Count, Absolute: 54.2 10*3/uL (ref 19.0–186.0)
Retic Ct Pct: 1.5 % (ref 0.4–3.1)

## 2023-03-12 LAB — CBC
HCT: 32.2 % — ABNORMAL LOW (ref 39.0–52.0)
Hemoglobin: 10.7 g/dL — ABNORMAL LOW (ref 13.0–17.0)
MCH: 29.3 pg (ref 26.0–34.0)
MCHC: 33.2 g/dL (ref 30.0–36.0)
MCV: 88.2 fL (ref 80.0–100.0)
Platelets: 241 10*3/uL (ref 150–400)
RBC: 3.65 MIL/uL — ABNORMAL LOW (ref 4.22–5.81)
RDW: 12.7 % (ref 11.5–15.5)
WBC: 7.9 10*3/uL (ref 4.0–10.5)
nRBC: 0 % (ref 0.0–0.2)

## 2023-03-12 LAB — BRAIN NATRIURETIC PEPTIDE: B Natriuretic Peptide: 431.1 pg/mL — ABNORMAL HIGH (ref 0.0–100.0)

## 2023-03-12 LAB — CULTURE, BLOOD (ROUTINE X 2)
Culture: NO GROWTH
Culture: NO GROWTH

## 2023-03-12 LAB — BASIC METABOLIC PANEL
Anion gap: 12 (ref 5–15)
BUN: 32 mg/dL — ABNORMAL HIGH (ref 8–23)
CO2: 23 mmol/L (ref 22–32)
Calcium: 8.2 mg/dL — ABNORMAL LOW (ref 8.9–10.3)
Chloride: 99 mmol/L (ref 98–111)
Creatinine, Ser: 1.73 mg/dL — ABNORMAL HIGH (ref 0.61–1.24)
GFR, Estimated: 41 mL/min — ABNORMAL LOW (ref >60)
Glucose, Bld: 117 mg/dL — ABNORMAL HIGH (ref 70–99)
Potassium: 3.2 mmol/L — ABNORMAL LOW (ref 3.5–5.1)
Sodium: 134 mmol/L — ABNORMAL LOW (ref 135–145)

## 2023-03-12 LAB — LACTATE DEHYDROGENASE: LDH: 119 U/L (ref 98–192)

## 2023-03-12 LAB — MAGNESIUM: Magnesium: 1.8 mg/dL (ref 1.7–2.4)

## 2023-03-12 SURGERY — CARDIOVERSION
Anesthesia: General

## 2023-03-12 MED ORDER — METOPROLOL SUCCINATE ER 100 MG PO TB24: 100.0000 mg | ORAL_TABLET | Freq: Every day | ORAL | 0 refills | Status: DC

## 2023-03-12 MED ORDER — POTASSIUM CHLORIDE CRYS ER 20 MEQ PO TBCR
40.0000 meq | EXTENDED_RELEASE_TABLET | ORAL | Status: AC
  Administered 2023-03-12 (×2): 40 meq via ORAL
  Filled 2023-03-12 (×2): qty 2

## 2023-03-12 MED ORDER — APIXABAN 5 MG PO TABS
5.0000 mg | ORAL_TABLET | Freq: Two times a day (BID) | ORAL | 0 refills | Status: DC
  Filled 2023-03-12: qty 60, 30d supply, fill #0

## 2023-03-12 MED ORDER — LIDOCAINE 2% (20 MG/ML) 5 ML SYRINGE
INTRAMUSCULAR | Status: DC | PRN
  Administered 2023-03-12: 60 mg via INTRAVENOUS

## 2023-03-12 MED ORDER — ZOLPIDEM TARTRATE 5 MG PO TABS: 5.0000 mg | ORAL_TABLET | Freq: Every evening | ORAL | Status: DC | PRN

## 2023-03-12 MED ORDER — METOPROLOL SUCCINATE ER 100 MG PO TB24
100.0000 mg | ORAL_TABLET | Freq: Every day | ORAL | 0 refills | Status: DC
  Filled 2023-03-12: qty 30, 30d supply, fill #0

## 2023-03-12 MED ORDER — SODIUM CHLORIDE 0.9 % IV SOLN: INTRAVENOUS | Status: DC

## 2023-03-12 MED ORDER — AMIODARONE HCL 200 MG PO TABS
ORAL_TABLET | ORAL | 0 refills | Status: DC
  Filled 2023-03-12: qty 58, 37d supply, fill #0

## 2023-03-12 MED ORDER — AMIODARONE HCL 200 MG PO TABS: ORAL_TABLET | ORAL | 0 refills | Status: DC

## 2023-03-12 MED ORDER — AMIODARONE HCL 200 MG PO TABS
400.0000 mg | ORAL_TABLET | Freq: Two times a day (BID) | ORAL | Status: DC
  Administered 2023-03-12: 400 mg via ORAL
  Filled 2023-03-12: qty 2

## 2023-03-12 MED ORDER — PROPOFOL 10 MG/ML IV BOLUS
INTRAVENOUS | Status: DC | PRN
  Administered 2023-03-12: 60 mg via INTRAVENOUS

## 2023-03-12 MED ORDER — AMIODARONE HCL 200 MG PO TABS: 200.0000 mg | ORAL_TABLET | Freq: Every day | ORAL | Status: DC

## 2023-03-12 MED ORDER — POTASSIUM CHLORIDE 10 MEQ/100ML IV SOLN: 10.0000 meq | INTRAVENOUS | Status: DC

## 2023-03-12 SURGICAL SUPPLY — 1 items: ELECT DEFIB PAD ADLT CADENCE (PAD) ×1 IMPLANT

## 2023-03-12 NOTE — Progress Notes (Signed)
Patient requesting medication stronger than melatonin for sleep. Dr. Antionette Char gave a one time order for ambien.

## 2023-03-12 NOTE — Telephone Encounter (Signed)
Pt's daughter and wife called concerned about taking toprol XL and amiodarone.  His BP is 124 systolic and HR 91   I asked them to take the 100 tonight, it is ordered at night and call the office in AM with BP and HR.  If BP low may need to decrease.    Triage:  Also he needs BMP  on Friday at [facility]  I placed the order but please make sure it came through.   Thank you

## 2023-03-12 NOTE — CV Procedure (Signed)
    Electrical Cardioversion Procedure Note Bridge Marz [id number] [birth year]  Procedure: Electrical Cardioversion Indications:  Atrial Fibrillation  Time Out: Verified patient identification, verified procedure,medications/allergies/relevent history reviewed, required imaging and test results available.  Performed  Procedure Details  The patient was NPO after midnight. Anesthesia was administered at the beside  by Dr.Germeroth with 60mg  of propofol.  Cardioversion was performed with synchronized biphasic defibrillation via AP pads with 200 joules.  1 attempt(s) were performed.  The patient converted to normal sinus rhythm. The patient tolerated the procedure well   IMPRESSION:  Successful cardioversion of atrial fibrillation with IV AMIODARONE. Last attempt 3 days ago without amiodarone was unsuccessful.     Donato Schultz 03/12/2023, 8:38 AM

## 2023-03-12 NOTE — Anesthesia Preprocedure Evaluation (Signed)
Anesthesia Evaluation  Patient identified by MRN, date of birth, ID band Patient awake    Reviewed: Allergy & Precautions, NPO status , Patient's Chart, lab work & pertinent test results  History of Anesthesia Complications Negative for: history of anesthetic complications  Airway Mallampati: I  TM Distance: >3 FB Neck ROM: Full   Comment: Previous grade I view with Miller 2, easy mask Dental  (+) Dental Advisory Given   Pulmonary neg shortness of breath, neg sleep apnea, neg COPD, neg recent URI, former smoker   Pulmonary exam normal breath sounds clear to auscultation       Cardiovascular (-) hypertensionpulmonary hypertension (moderate)(-) angina +CHF (EF 45-50%)  (-) Past MI, (-) Cardiac Stents and (-) CABG + dysrhythmias Atrial Fibrillation + Valvular Problems/Murmurs (mild-to-moderate) MR  Rhythm:Irregular Rate:Normal  TTE 03/08/2023: IMPRESSIONS     1. Left ventricular ejection fraction, by estimation, is 45 to 50%. The  left ventricle has mildly decreased function. The left ventricle  demonstrates global hypokinesis. The left ventricular internal cavity size  was mildly to moderately dilated. Left  ventricular diastolic function could not be evaluated.   2. Right ventricular systolic function is normal. The right ventricular  size is normal. There is moderately elevated pulmonary artery systolic  pressure.   3. The mitral valve is grossly normal. Mild to moderate mitral valve  regurgitation. No evidence of mitral stenosis.   4. The aortic valve is tricuspid. There is mild calcification of the  aortic valve. Aortic valve regurgitation is not visualized. No aortic  stenosis is present.   5. The inferior vena cava is dilated in size with <50% respiratory  variability, suggesting right atrial pressure of 15 mmHg.     Neuro/Psych negative neurological ROS     GI/Hepatic ,GERD  Medicated,,Gilbert's disease    Endo/Other  negative endocrine ROS    Renal/GU Renal disease (s/p partial right nephrectomy)     Musculoskeletal   Abdominal   Peds  Hematology  (+) Blood dyscrasia, anemia Lab Results      Component                Value               Date                      WBC                      7.5                 03/09/2023                HGB                      10.9 (L)            03/09/2023                HCT                      33.3 (L)            03/09/2023                MCV                      92.0                03/09/2023  PLT                      226                 03/09/2023              Anesthesia Other Findings   Reproductive/Obstetrics                             Anesthesia Physical Anesthesia Plan  ASA: 3  Anesthesia Plan: General   Post-op Pain Management: Minimal or no pain anticipated   Induction: Intravenous  PONV Risk Score and Plan: 1 and Propofol infusion, TIVA and Treatment may vary due to age or medical condition  Airway Management Planned: Natural Airway and Nasal Cannula  Additional Equipment:   Intra-op Plan:   Post-operative Plan:   Informed Consent: I have reviewed the patients History and Physical, chart, labs and discussed the procedure including the risks, benefits and alternatives for the proposed anesthesia with the patient or authorized representative who has indicated his/her understanding and acceptance.     Dental advisory given  Plan Discussed with: CRNA  Anesthesia Plan Comments:        Anesthesia Quick Evaluation

## 2023-03-12 NOTE — Transfer of Care (Signed)
Immediate Anesthesia Transfer of Care Note  Patient: Kevin Doyle  Procedure(s) Performed: CARDIOVERSION  Patient Location: Cath Lab  Anesthesia Type:General  Level of Consciousness: drowsy and patient cooperative  Airway & Oxygen Therapy: Patient Spontanous Breathing and Patient connected to nasal cannula oxygen  Post-op Assessment: Report given to RN and Post -op Vital signs reviewed and stable  Post vital signs: Reviewed and stable  Last Vitals:  Vitals Value Taken Time  BP    Temp    Pulse 128 03/12/23 0826  Resp 22 03/12/23 0826  SpO2 94 % 03/12/23 0826  Vitals shown include unfiled device data.  Last Pain:  Vitals:   03/12/23 0755  TempSrc: Temporal  PainSc: 0-No pain      Patients Stated Pain Goal: 0 (03/07/23 1830)  Complications: No notable events documented.

## 2023-03-12 NOTE — Telephone Encounter (Signed)
Returned call LVM. Also sent a message to MyChart.

## 2023-03-12 NOTE — Progress Notes (Signed)
   Patient Name: Briana Wingert Date of Encounter: 03/12/2023 [facility] Cardiologist: Rollene Rotunda, MD   Interval Summary  .    He feels better No orthopnea/PND/ no LE edema  Interval Hx S/p dccv afebrile  Vital Signs .    Vitals:   03/12/23 0839 03/12/23 0850 03/12/23 0855 03/12/23 0921  BP: 101/70 101/68 105/72 (!) 107/95  Pulse: 80 83 82 85  Resp: (!) 22 (!) 25 (!) 24 20  Temp: (!) 97.5 F (36.4 C) 98 F (36.7 C)  97.8 F (36.6 C)  TempSrc: Temporal Temporal  Oral  SpO2: 94% 97% 97% 99%  Weight:      Height:        Intake/Output Summary (Last 24 hours) at 03/12/2023 1005 Last data filed at 03/12/2023 0300 Gross per 24 hour  Intake --  Output 1125 ml  Net -1125 ml      03/12/2023    7:55 AM 03/07/2023   10:27 AM 11/06/2019    8:42 AM  Last 3 Weights  Weight (lbs) 200 lb 208 lb 1.6 oz 194 lb 6 oz  Weight (kg) 90.719 kg 94.394 kg 88.168 kg      Telemetry/ECG    Sinus rhythm - Personally Reviewed ECG-Sinus rhythm  Physical Exam .    GEN: No acute distress. Neck: No JVD Cardiac: RRR Respiratory: nl wob, no rales GI: Soft, nontender, distended with fluid wave MS: No edema in BLE  Integument: No rash  Assessment & Plan .     Paroxysmal atrial fibrillation - s/p DCCV with return to AF; no LAA thrombus on TEE; repeat DCCV today was successful.  - changed IV amio to oral - continue metop succinate 100 mg daily XL - Continue Eliquis 5 mg twice daily-CHA2DS2-VASc 1 (age). 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 Will work on [employer].      For questions or updates, please contact [facility] Please consult [email or web address] for contact info under   Maisie Fus

## 2023-03-12 NOTE — Interval H&P Note (Signed)
History and Physical Interval Note:  03/12/2023 8:27 AM  Kevin Doyle [name]  has presented today for surgery, with the diagnosis of afib.  The various methods of treatment have been discussed with the patient and family. After consideration of risks, benefits and other options for treatment, the patient has consented to  Procedure(s): CARDIOVERSION (N/A) as a surgical intervention.  The patient's history has been reviewed, patient examined, no change in status, stable for surgery.  I have reviewed the patient's chart and labs.  Questions were answered to the patient's satisfaction.     [employer]

## 2023-03-12 NOTE — Plan of Care (Signed)
Patient awaiting discharge with oral antiarrhythmics.  Maintaining NSR and vital signs are WDL.  No active complaints at this time, per patient.  Provided education at bedside to patient/family regarding medications, activity progression, discharge instructions, follow-up appointments/laboratory and issues to monitor for at home.  Patient/family verbalized understanding.

## 2023-03-12 NOTE — Discharge Summary (Signed)
Physician Discharge Summary  Kevin Doyle ZOX:[id number] DOB: 1951-06-04 DOA: 03/07/2023  PCP: Patient, No Pcp Per  Admit date: 03/07/2023 Discharge date: 03/12/2023 30 Day Unplanned Readmission Risk Score    Flowsheet Row ED to Hosp-Admission (Current) from 03/07/2023 in [facility]  30 Day Unplanned Readmission Risk Score (%) 17.22 Filed at 03/12/2023 1200       This score is the patient's risk of an unplanned readmission within 30 days of being discharged (0 -100%). The score is based on dignosis, age, lab data, medications, orders, and past utilization.   Low:  0-14.9   Medium: 15-21.9   High: 22-29.9   Extreme: 30 and above          Admitted From: Home Disposition: Home  Recommendations for Outpatient Follow-up:  Follow up with PCP in 1-2 weeks Please obtain BMP/CBC in one week Follow-up with cardiology in 2 weeks Please follow up with your PCP on the following pending results: Unresulted Labs (From admission, onward)     Start     Ordered   03/12/23 0500  Haptoglobin  Daily,   R       Question:  Specimen collection method  Answer:  Lab=Lab collect   03/11/23 1006   03/09/23 0500  Basic metabolic panel  Daily,   R      03/08/23 0842   03/09/23 0500  CBC  Daily,   R      03/08/23 0842   03/09/23 0500  Magnesium  Daily,   R      03/08/23 0842              Home Health: None Equipment/Devices: None  Discharge Condition: Stable CODE STATUS: Full code Diet recommendation: Cardiac  Subjective: Seen and examined after cardioversion.  He was feeling well and much better without any complaints.  He was happy that it was successful cardioversion finally.  Brief/Interim Summary: 72 year old with history of paroxysmal A-fib, GERD comes to the hospital for evaluation of palpitations and fatigue.  In the ER patient was noted to be in atrial fibrillation with RVR therefore admitted to the hospital.  Details below.   Atrial fibrillation with RVR  (HCC) Known history of paroxysmal atrial fibrillation not on any anticoagulation at home.  -cardioversion attempted on 8/23 without success.  Patient started on amiodarone drip, underwent another attempt of cardioversion today 03/12/2023 which was successful.  He is in sinus rhythm now and feels much better.  Cardiology has cleared him for the discharge.  They have recommended Toprol-XL 72 mg p.o. daily, amiodarone and Eliquis.  Aspirin is discontinued.  I have discussed with his daughter Raynelle Fanning over the phone, she has heard from the cardiology and she feels comfortable with patient returning home.  She is going to give a call to her mother and the patient about this plan.   Multifocal infiltrate with new acute respiratory failure -?volume overload.  Respiratory panel COVID-19 is negative.   -BNP is elevated, procalcitonin negative and so is WBC count so initially doubted pneumonia and antibiotics were discontinued.  He is now complaining of some cough for last few days, chest pain with deep inspiration and had low-grade temperature of 100.2 early morning at 4 AM on 03/11/2023.  Repeat procalcitonin was also unremarkable.  He may have had viral infection which we do not know of.  Nonetheless, he has not had any fever for last 36 hours.   Acute pulm edema/acute systolic congestive heart failure: Chest x-ray shows bilateral  diffuse pulmonary edema and echo showed EF of 45 to 50%.  He received 1 dose of IV Lasix 60 mg and was started on 40 mg IV Lasix twice daily.  His creatinine jumped again today, he received 1 dose of Lasix yesterday.  May need to hold further Lasix.  BNP is improving.  He is on room air.   Vitamin B12 deficiency - Supplement   AKI: Creatinine continues to rise and 1.73 today.  He is not on any nephrotoxic agents.  His creatinine was normal 4 days ago at 1.0.  Had a lengthy discussion with his daughter Raynelle Fanning over the phone.  My initial recommendation was for him to stay overnight so we  can possibly gently hydrate him and repeat labs in the morning but she tells me that patient is eager to go home and will be disappointed if he were to stay in the hospital any longer.  She felt comfortable with him going home.  I highly recommended that he sees a PCP or his cardiologist in 2 to 3 days and repeat BMP and keeps himself well hydrated for next 2 to 3 days.  But not to drink too much of fluids due to low EF.  She verbalized understanding.   Normocytic anemia Borderline low saturation and ferritin.  Hemoglobin overall stable at 11 -supplement   Hypokalemia: He is repleted.  Discharge plan was discussed with patient and/or family member and they verbalized understanding and agreed with it.  Discharge Diagnoses:  Principal Problem:   Atrial fibrillation with RVR (HCC) Active Problems:   Hypokalemia   Normocytic anemia   BPH (benign prostatic hyperplasia)   CAP (community acquired pneumonia)   A-fib [facility])    Discharge Instructions   Allergies as of 03/12/2023   No Known Allergies      Medication List     STOP taking these medications    aspirin EC 81 MG tablet       TAKE these medications    amiodarone 200 MG tablet Commonly known as: PACERONE Take 2 tablets (400 mg total) by mouth 2 (two) times daily for 7 days, THEN 1 tablet (200 mg total) daily. Start taking on: March 12, 2023   apixaban 5 MG Tabs tablet Commonly known as: ELIQUIS Take 1 tablet (5 mg total) by mouth 2 (two) times daily.   CoQ10 100 MG Caps Take 100 mg by mouth daily.   ELDERBERRY PO Take 1 tablet by mouth daily. Gummies   metoprolol succinate 100 MG 24 hr tablet Commonly known as: TOPROL-XL Take 1 tablet (100 mg total) by mouth at bedtime. Take with or immediately following a meal.   multivitamin tablet Take 1 tablet by mouth daily.   pantoprazole 40 MG tablet Commonly known as: PROTONIX Take 1 tablet (40 mg total) by mouth 2 (two) times daily. Take for 8 weeks to promote  mucosal healing.   vitamin C 100 MG tablet Take 500 mg by mouth daily.        Follow-up Information     Primary Care Physician. Schedule an appointment as soon as possible for a visit.                 No Known Allergies  Consultations: Cardiology   Procedures/Studies: EP STUDY  Result Date: 03/12/2023 See surgical note for result.  DG CHEST PORT 1 VIEW  Result Date: 03/11/2023 CLINICAL DATA:  CHF EXAM: PORTABLE CHEST 1 VIEW COMPARISON:  03/09/2023 FINDINGS: Mild cardiomegaly. Small, layering bilateral pleural effusions.  No acute appearing airspace opacity. The visualized skeletal structures are unremarkable. IMPRESSION: Mild cardiomegaly. Small, layering bilateral pleural effusions. No acute appearing airspace opacity. Electronically Signed   By: Jearld Lesch M.D.   On: 03/11/2023 17:27   DG CHEST PORT 1 VIEW  Result Date: 03/09/2023 CLINICAL DATA:  Hypoxia. EXAM: PORTABLE CHEST 1 VIEW COMPARISON:  03/07/2023 FINDINGS: Heart size upper normal. Stable mediastinal contours. No interstitial and septal thickening suspicious for pulmonary edema. There are small bilateral pleural effusions. No pneumothorax or confluent airspace disease. IMPRESSION: Borderline cardiomegaly. New pulmonary edema and small bilateral pleural effusions. Electronically Signed   By: Narda Rutherford M.D.   On: 03/09/2023 19:25   ECHO TEE  Result Date: 03/09/2023    TRANSESOPHOGEAL ECHO REPORT   Patient Name:   Kevin Doyle Date of Exam: 03/09/2023 Medical Rec #:  [id number]        Height:       74.0 in Accession #:    [id number]       Weight:       208.1 lb Date of Birth:  1951-02-21        BSA:          2.210 m Patient Age:    71 years         BP:           111/72 mmHg Patient Gender: M                HR:           132 bpm. Exam Location:  Inpatient Procedure: Transesophageal Echo, Color Doppler, Cardiac Doppler and 3D Echo Indications:     Afib  History:         Patient has prior history of  Echocardiogram examinations, most                  recent 03/08/2023. Arrythmias:Atrial Fibrillation.  Sonographer:     Milbert Coulter Referring Phys:  [id number] Jonita Albee Diagnosing Phys: Riley Lam MD PROCEDURE: After discussion of the risks and benefits of a TEE, an informed consent was obtained from the patient. The transesophogeal probe was passed without difficulty through the esophogus of the patient. Imaged were obtained with the patient in a left lateral decubitus position. Local oropharyngeal anesthetic was provided with viscous lidocaine. Sedation performed by different physician. The patient was monitored while under deep sedation. Anesthestetic sedation was provided intravenously by Anesthesiology: 190mg  of Propofol, 100mg  of Lidocaine. Image quality was good. The patient's vital signs; including heart rate, blood pressure, and oxygen saturation; remained stable throughout the procedure. The patient developed no complications during  the procedure. An unsuccessful direct current cardioversion was performed at 200 joules with 2 attempts.  IMPRESSIONS  1. Left ventricular ejection fraction, by estimation, is 45 to 50%. The left ventricle has mildly decreased function. There is severe left ventricular hypertrophy.  2. Right ventricular systolic function is low normal. The right ventricular size is mildly enlarged.  3. Left atrial size was mildly dilated. No left atrial/left atrial appendage thrombus was detected.  4. Right atrial size was mildly dilated.  5. A small pericardial effusion is present. The pericardial effusion is localized near the right atrium.  6. The mitral valve is normal in structure. Mild to moderate mitral valve regurgitation. No evidence of mitral stenosis.  7. The aortic valve is tricuspid. Aortic valve regurgitation is not visualized. No aortic stenosis is present.  8. Mildly dilated pulmonary artery.  9. The inferior  vena cava is normal in size with greater than 50%  respiratory variability, suggesting right atrial pressure of 3 mmHg. FINDINGS  Left Ventricle: Left ventricular ejection fraction, by estimation, is 45 to 50%. The left ventricle has mildly decreased function. The left ventricular internal cavity size was normal in size. There is severe left ventricular hypertrophy. Right Ventricle: The right ventricular size is mildly enlarged. No increase in right ventricular wall thickness. Right ventricular systolic function is low normal. Left Atrium: Left atrial size was mildly dilated. No left atrial/left atrial appendage thrombus was detected. Right Atrium: Right atrial size was mildly dilated. Pericardium: A small pericardial effusion is present. The pericardial effusion is localized near the right atrium. Mitral Valve: Atrial functional mitral regurgitation. The mitral valve is normal in structure. Mild to moderate mitral valve regurgitation. No evidence of mitral valve stenosis. Tricuspid Valve: The tricuspid valve is normal in structure. Tricuspid valve regurgitation is trivial. No evidence of tricuspid stenosis. Aortic Valve: The aortic valve is tricuspid. Aortic valve regurgitation is not visualized. No aortic stenosis is present. Pulmonic Valve: The pulmonic valve was normal in structure. Pulmonic valve regurgitation is trivial. Aorta: The aortic root, ascending aorta, aortic arch and descending aorta are all structurally normal, with no evidence of dilitation or obstruction. Pulmonary Artery: The pulmonary artery is mildly dilated. Venous: The inferior vena cava is normal in size with greater than 50% respiratory variability, suggesting right atrial pressure of 3 mmHg. IAS/Shunts: No atrial level shunt detected by color flow Doppler. Additional Comments: Spectral Doppler performed.  AORTA Ao Asc diam: 2.90 cm Riley Lam MD Electronically signed by Riley Lam MD Signature Date/Time: 03/09/2023/3:33:13 PM    Final    EP STUDY  Result Date:  03/09/2023 See surgical note for result.  ECHOCARDIOGRAM COMPLETE  Result Date: 03/08/2023    ECHOCARDIOGRAM REPORT   Patient Name:   Kevin Doyle Date of Exam: 03/08/2023 Medical Rec #:  [id number]        Height:       74.0 in Accession #:    [id number]       Weight:       208.1 lb Date of Birth:  Dec 12, 1950        BSA:          2.210 m Patient Age:    71 years         BP:           111/68 mmHg Patient Gender: M                HR:           102 bpm. Exam Location:  Inpatient Procedure: 2D Echo, Cardiac Doppler and Color Doppler Indications:    I48.91* Unspeicified atrial fibrillation  History:        Patient has no prior history of Echocardiogram examinations.                 Abnormal ECG, Arrythmias:Atrial Fibrillation;                 Signs/Symptoms:Dizziness/Lightheadedness, Dyspnea and Shortness                 of Breath. Pneumonia.  Sonographer:    Sheralyn Boatman RDCS Referring Phys: [id number] [facility] GOEL IMPRESSIONS  1. Left ventricular ejection fraction, by estimation, is 45 to 50%. The left ventricle has mildly decreased function. The left ventricle demonstrates global hypokinesis. The left ventricular internal cavity size was mildly to moderately dilated. Left ventricular  diastolic function could not be evaluated.  2. Right ventricular systolic function is normal. The right ventricular size is normal. There is moderately elevated pulmonary artery systolic pressure.  3. The mitral valve is grossly normal. Mild to moderate mitral valve regurgitation. No evidence of mitral stenosis.  4. The aortic valve is tricuspid. There is mild calcification of the aortic valve. Aortic valve regurgitation is not visualized. No aortic stenosis is present.  5. The inferior vena cava is dilated in size with <50% respiratory variability, suggesting right atrial pressure of 15 mmHg. Comparison(s): No prior Echocardiogram. Conclusion(s)/Recommendation(s): Tachycardia throughout study limits sensitivity for detection of focal wall  motion abnormalities. FINDINGS  Left Ventricle: Left ventricular ejection fraction, by estimation, is 45 to 50%. The left ventricle has mildly decreased function. The left ventricle demonstrates global hypokinesis. The left ventricular internal cavity size was mildly to moderately dilated. There is borderline left ventricular hypertrophy. Left ventricular diastolic function could not be evaluated due to atrial fibrillation. Left ventricular diastolic function could not be evaluated. Right Ventricle: The right ventricular size is normal. Right vetricular wall thickness was not well visualized. Right ventricular systolic function is normal. There is moderately elevated pulmonary artery systolic pressure. The tricuspid regurgitant velocity is 2.80 m/s, and with an assumed right atrial pressure of 15 mmHg, the estimated right ventricular systolic pressure is 46.4 mmHg. Left Atrium: Left atrial size was normal in size. Right Atrium: Right atrial size was normal in size. Pericardium: Trivial pericardial effusion is present. Mitral Valve: The mitral valve is grossly normal. Mild to moderate mitral valve regurgitation. No evidence of mitral valve stenosis. Tricuspid Valve: The tricuspid valve is normal in structure. Tricuspid valve regurgitation is mild . No evidence of tricuspid stenosis. Aortic Valve: The aortic valve is tricuspid. There is mild calcification of the aortic valve. Aortic valve regurgitation is not visualized. No aortic stenosis is present. Pulmonic Valve: The pulmonic valve was not well visualized. Pulmonic valve regurgitation is not visualized. No evidence of pulmonic stenosis. Aorta: The aortic root, ascending aorta and aortic arch are all structurally normal, with no evidence of dilitation or obstruction. Venous: The inferior vena cava is dilated in size with less than 50% respiratory variability, suggesting right atrial pressure of 15 mmHg. IAS/Shunts: The atrial septum is grossly normal.  LEFT  VENTRICLE PLAX 2D LVIDd:         5.50 cm LVIDs:         4.40 cm LV PW:         1.10 cm LV IVS:        1.10 cm LVOT diam:     2.60 cm LV SV:         73 LV SV Index:   33 LVOT Area:     5.31 cm  LV Volumes (MOD) LV vol d, MOD A2C: 105.0 ml LV vol d, MOD A4C: 75.4 ml LV vol s, MOD A2C: 56.0 ml LV vol s, MOD A4C: 38.9 ml LV SV MOD A2C:     49.0 ml LV SV MOD A4C:     75.4 ml LV SV MOD BP:      43.9 ml RIGHT VENTRICLE             IVC RV S prime:     12.10 cm/s  IVC diam: 2.50 cm TAPSE (M-mode): 1.9 cm LEFT ATRIUM             Index        RIGHT ATRIUM  Index LA diam:        4.40 cm 1.99 cm/m   RA Area:     15.40 cm LA Vol (A2C):   53.4 ml 24.16 ml/m  RA Volume:   42.50 ml  19.23 ml/m LA Vol (A4C):   65.8 ml 29.77 ml/m LA Biplane Vol: 59.2 ml 26.78 ml/m  AORTIC VALVE LVOT Vmax:   93.60 cm/s LVOT Vmean:  62.600 cm/s LVOT VTI:    0.138 m  AORTA Ao Root diam: 3.50 cm Ao Asc diam:  3.50 cm MITRAL VALVE               TRICUSPID VALVE MV Area (PHT): 3.03 cm    TR Peak grad:   31.4 mmHg MV Decel Time: 250 msec    TR Vmax:        280.00 cm/s MV E velocity: 94.10 cm/s                            SHUNTS                            Systemic VTI:  0.14 m                            Systemic Diam: 2.60 cm Jodelle Red MD Electronically signed by Jodelle Red MD Signature Date/Time: 03/08/2023/5:13:55 PM    Final    DG Chest Port 1 View  Result Date: 03/07/2023 CLINICAL DATA:  Atrial fibrillation. EXAM: PORTABLE CHEST 1 VIEW COMPARISON:  X-ray 02/10/2013 FINDINGS: No consolidation, pneumothorax or effusion. No edema. Normal cardiopericardial silhouette. Overlapping cardiac leads hyperinflation. IMPRESSION: Hyperinflation.  No acute cardiopulmonary disease. Electronically Signed   By: Karen Kays M.D.   On: 03/07/2023 11:46     Discharge Exam: Vitals:   03/12/23 0921 03/12/23 1306  BP: (!) 107/95 112/67  Pulse: 85 81  Resp: 20 18  Temp: 97.8 F (36.6 C) 97.9 F (36.6 C)  SpO2: 99% 99%    Vitals:   03/12/23 0850 03/12/23 0855 03/12/23 0921 03/12/23 1306  BP: 101/68 105/72 (!) 107/95 112/67  Pulse: 83 82 85 81  Resp: (!) 25 (!) 24 20 18   Temp: 98 F (36.7 C)  97.8 F (36.6 C) 97.9 F (36.6 C)  TempSrc: Temporal  Oral Oral  SpO2: 97% 97% 99% 99%  Weight:      Height:        General: Pt is alert, awake, not in acute distress Cardiovascular: RRR, S1/S2 +, no rubs, no gallops Respiratory: CTA bilaterally, no wheezing, no rhonchi Abdominal: Soft, NT, ND, bowel sounds + Extremities: no edema, no cyanosis    The results of significant diagnostics from this hospitalization (including imaging, microbiology, ancillary and laboratory) are listed below for reference.     Microbiology: Recent Results (from the past 240 hour(s))  Respiratory (~20 pathogens) panel by PCR     Status: None   Collection Time: 03/07/23  7:23 PM   Specimen: Nasopharyngeal Swab; Respiratory  Result Value Ref Range Status   Adenovirus NOT DETECTED NOT DETECTED Final   Coronavirus 229E NOT DETECTED NOT DETECTED Final    Comment: (NOTE) The Coronavirus on the Respiratory Panel, DOES NOT test for the novel  Coronavirus (2019 nCoV)    Coronavirus HKU1 NOT DETECTED NOT DETECTED Final   Coronavirus NL63 NOT DETECTED NOT DETECTED Final   Coronavirus  OC43 NOT DETECTED NOT DETECTED Final   Metapneumovirus NOT DETECTED NOT DETECTED Final   Rhinovirus / Enterovirus NOT DETECTED NOT DETECTED Final   Influenza A NOT DETECTED NOT DETECTED Final   Influenza B NOT DETECTED NOT DETECTED Final   Parainfluenza Virus 1 NOT DETECTED NOT DETECTED Final   Parainfluenza Virus 2 NOT DETECTED NOT DETECTED Final   Parainfluenza Virus 3 NOT DETECTED NOT DETECTED Final   Parainfluenza Virus 4 NOT DETECTED NOT DETECTED Final   Respiratory Syncytial Virus NOT DETECTED NOT DETECTED Final   Bordetella pertussis NOT DETECTED NOT DETECTED Final   Bordetella Parapertussis NOT DETECTED NOT DETECTED Final   Chlamydophila  pneumoniae NOT DETECTED NOT DETECTED Final   Mycoplasma pneumoniae NOT DETECTED NOT DETECTED Final    Comment: Performed at [facility], [address], [place], [place] [address]  SARS Coronavirus 2 by RT PCR (hospital order, performed in [facility] hospital lab) *cepheid single result test* Anterior Nasal Swab     Status: None   Collection Time: 03/07/23  7:23 PM   Specimen: Anterior Nasal Swab  Result Value Ref Range Status   SARS Coronavirus 2 by RT PCR NEGATIVE NEGATIVE Final    Comment: (NOTE) SARS-CoV-2 target nucleic acids are NOT DETECTED.  The SARS-CoV-2 RNA is generally detectable in upper and lower respiratory specimens during the acute phase of infection. The lowest concentration of SARS-CoV-2 viral copies this assay can detect is 250 copies / mL. A negative result does not preclude SARS-CoV-2 infection and should not be used as the sole basis for treatment or other patient management decisions.  A negative result may occur with improper specimen collection / handling, submission of specimen other than nasopharyngeal swab, presence of viral mutation(s) within the areas targeted by this assay, and inadequate number of viral copies (<250 copies / mL). A negative result must be combined with clinical observations, patient history, and epidemiological information.  Fact Sheet for Patients:   RoadLapTop.co.za  Fact Sheet for Healthcare Providers: http://kim-miller.com/  This test is not yet approved or  cleared by the [place] FDA and has been authorized for detection and/or diagnosis of SARS-CoV-2 by FDA under an Emergency Use Authorization (EUA).  This EUA will remain in effect (meaning this test can be used) for the duration of the COVID-19 declaration under Section 564(b)(1) of the Act, 21 U.S.C. section 360bbb-3(b)(1), unless the authorization is terminated or revoked sooner.  Performed at [facility], [address], [place], [place] [address]   Culture, blood (Routine X 2) w Reflex to ID Panel     Status: None   Collection Time: 03/07/23  9:23 PM   Specimen: BLOOD RIGHT ARM  Result Value Ref Range Status   Specimen Description   Final    BLOOD RIGHT ARM Performed at [facility], [address], [place], [place] [address]    Special Requests   Final    BOTTLES DRAWN AEROBIC AND ANAEROBIC Blood Culture results may not be optimal due to an inadequate volume of blood received in culture bottles Performed at [facility], [address], [place], [place] [address]    Culture   Final    NO GROWTH 5 DAYS Performed at [facility], [address] [place], [place] [address]    Report Status 03/12/2023 FINAL  Final  Culture, blood (Routine X 2) w Reflex to ID Panel     Status: None   Collection Time: 03/07/23  9:23 PM   Specimen: BLOOD LEFT HAND  Result Value Ref Range Status   Specimen Description   Final    BLOOD LEFT HAND Performed at [facility], [address], [place], [place] [address]    Special Requests   Final    BOTTLES DRAWN AEROBIC AND ANAEROBIC Blood Culture results may not be optimal due to an inadequate volume of blood received in culture bottles Performed at [facility], [address] [place], [place] [address]    Culture   Final    NO GROWTH 5 DAYS Performed at [facility], [address] [place], [place] [address]    Report Status 03/12/2023 FINAL  Final     Labs: BNP (last 3 results) Recent Labs    03/07/23 1030 03/12/23 0435  BNP 859.5* 431.1*   Basic Metabolic Panel: Recent Labs  Lab 03/07/23 1030 03/08/23 0505 03/09/23 0438 03/10/23 0310 03/11/23 0616 03/12/23 0435  NA 138 136 136 136 137 134*  K 3.4* 4.0 3.4* 3.5 3.7 3.2*  CL 107 109 106 102 103 99  CO2 24 19* 22 23 21* 23  GLUCOSE 133* 120* 114* 108* 128* 117*  BUN 21 19 22 22  24* 32*  CREATININE 1.05 0.90  1.01 1.32* 1.64* 1.73*  CALCIUM 7.8* 7.9* 8.0* 8.0* 8.3* 8.2*  MG 1.8  --  1.9 2.0 1.7 1.8   Liver Function Tests: Recent Labs  Lab 03/07/23 1211  AST 16  ALT 17  ALKPHOS 83  BILITOT 3.7*  PROT 6.3*  ALBUMIN 3.2*   No results for input(s): "LIPASE", "AMYLASE" in the last 168 hours. No results for input(s): "AMMONIA" in the last 168 hours. CBC: Recent Labs  Lab 03/08/23 0505 03/09/23 0438 03/10/23 0310 03/11/23 0616 03/12/23 0435  WBC 9.2 7.5 7.2 12.8* 7.9  HGB 11.0* 10.9* 11.9* 12.4* 10.7*  HCT 34.8* 33.3* 35.7* 36.9* 32.2*  MCV 96.9 92.0 92.0 90.2 88.2  PLT 195 226 255 344 241   Cardiac Enzymes: No results for input(s): "CKTOTAL", "CKMB", "CKMBINDEX", "TROPONINI" in the last 168 hours. BNP: Invalid input(s): "POCBNP" CBG: No results for input(s): "GLUCAP" in the last 168 hours. D-Dimer No results for input(s): "DDIMER" in the last 72 hours. Hgb A1c No results for input(s): "HGBA1C" in the last 72 hours. Lipid Profile No results for input(s): "CHOL", "HDL", "LDLCALC", "TRIG", "CHOLHDL", "LDLDIRECT" in the last 72 hours. Thyroid function studies No results for input(s): "TSH", "T4TOTAL", "T3FREE", "THYROIDAB" in the last 72 hours.  Invalid input(s): "FREET3" Anemia work up Recent Labs    03/12/23 0435  RETICCTPCT 1.5   Urinalysis    Component Value Date/Time   COLORURINE AMBER (A) 09/01/2019 1709   APPEARANCEUR HAZY (A) 09/01/2019 1709   LABSPEC 1.019 09/01/2019 1709   PHURINE 6.0 09/01/2019 1709   GLUCOSEU NEGATIVE 09/01/2019 1709   HGBUR LARGE (A) 09/01/2019 1709   BILIRUBINUR NEGATIVE 09/01/2019 1709   BILIRUBINUR neg 09/25/2013 1422   KETONESUR 80 (A) 09/01/2019 1709   PROTEINUR 30 (A) 09/01/2019 1709   UROBILINOGEN negative 09/25/2013 1422   UROBILINOGEN 4.0 (H) 07/10/2012 0428   NITRITE NEGATIVE 09/01/2019 1709   LEUKOCYTESUR NEGATIVE 09/01/2019 1709   Sepsis Labs Recent Labs  Lab 03/09/23 0438 03/10/23 0310 03/11/23 0616 03/12/23 0435   WBC 7.5 7.2 12.8* 7.9   Microbiology Recent Results (from the past 240 hour(s))  Respiratory (~20 pathogens) panel by PCR     Status: None   Collection Time: 03/07/23  7:23  PM   Specimen: Nasopharyngeal Swab; Respiratory  Result Value Ref Range Status   Adenovirus NOT DETECTED NOT DETECTED Final   Coronavirus 229E NOT DETECTED NOT DETECTED Final    Comment: (NOTE) The Coronavirus on the Respiratory Panel, DOES NOT test for the novel  Coronavirus (2019 nCoV)    Coronavirus HKU1 NOT DETECTED NOT DETECTED Final   Coronavirus NL63 NOT DETECTED NOT DETECTED Final   Coronavirus OC43 NOT DETECTED NOT DETECTED Final   Metapneumovirus NOT DETECTED NOT DETECTED Final   Rhinovirus / Enterovirus NOT DETECTED NOT DETECTED Final   Influenza A NOT DETECTED NOT DETECTED Final   Influenza B NOT DETECTED NOT DETECTED Final   Parainfluenza Virus 1 NOT DETECTED NOT DETECTED Final   Parainfluenza Virus 2 NOT DETECTED NOT DETECTED Final   Parainfluenza Virus 3 NOT DETECTED NOT DETECTED Final   Parainfluenza Virus 4 NOT DETECTED NOT DETECTED Final   Respiratory Syncytial Virus NOT DETECTED NOT DETECTED Final   Bordetella pertussis NOT DETECTED NOT DETECTED Final   Bordetella Parapertussis NOT DETECTED NOT DETECTED Final   Chlamydophila pneumoniae NOT DETECTED NOT DETECTED Final   Mycoplasma pneumoniae NOT DETECTED NOT DETECTED Final    Comment: Performed at [facility], [address] [place], [place] [address]  SARS Coronavirus 2 by RT PCR (hospital order, performed in [facility] hospital lab) *cepheid single result test* Anterior Nasal Swab     Status: None   Collection Time: 03/07/23  7:23 PM   Specimen: Anterior Nasal Swab  Result Value Ref Range Status   SARS Coronavirus 2 by RT PCR NEGATIVE NEGATIVE Final    Comment: (NOTE) SARS-CoV-2 target nucleic acids are NOT DETECTED.  The SARS-CoV-2 RNA is generally detectable in upper and lower respiratory specimens during the acute phase of  infection. The lowest concentration of SARS-CoV-2 viral copies this assay can detect is 250 copies / mL. A negative result does not preclude SARS-CoV-2 infection and should not be used as the sole basis for treatment or other patient management decisions.  A negative result may occur with improper specimen collection / handling, submission of specimen other than nasopharyngeal swab, presence of viral mutation(s) within the areas targeted by this assay, and inadequate number of viral copies (<250 copies / mL). A negative result must be combined with clinical observations, patient history, and epidemiological information.  Fact Sheet for Patients:   RoadLapTop.co.za  Fact Sheet for Healthcare Providers: http://kim-miller.com/  This test is not yet approved or  cleared by the [place] FDA and has been authorized for detection and/or diagnosis of SARS-CoV-2 by FDA under an Emergency Use Authorization (EUA).  This EUA will remain in effect (meaning this test can be used) for the duration of the COVID-19 declaration under Section 564(b)(1) of the Act, 21 U.S.C. section 360bbb-3(b)(1), unless the authorization is terminated or revoked sooner.  Performed at [facility], [address], [place], [place] [address]   Culture, blood (Routine X 2) w Reflex to ID Panel     Status: None   Collection Time: 03/07/23  9:23 PM   Specimen: BLOOD RIGHT ARM  Result Value Ref Range Status   Specimen Description   Final    BLOOD RIGHT ARM Performed at [facility], [address], [place], [place] [address]    Special Requests   Final    BOTTLES DRAWN AEROBIC AND ANAEROBIC Blood Culture results may not be optimal due to an inadequate volume of blood received in culture  bottles Performed at [facility], [address] [place], [place] [address]    Culture   Final    NO GROWTH 5 DAYS Performed at [facility], [address] [place], [place] [address]    Report Status 03/12/2023 FINAL  Final  Culture, blood (Routine X 2) w Reflex to ID Panel     Status: None   Collection Time: 03/07/23  9:23 PM   Specimen: BLOOD LEFT HAND  Result Value Ref Range Status   Specimen Description   Final    BLOOD LEFT HAND Performed at [facility], [address], [place], [place] [address]    Special Requests   Final    BOTTLES DRAWN AEROBIC AND ANAEROBIC Blood Culture results may not be optimal due to an inadequate volume of blood received in culture bottles Performed at [facility], [address], [place], [place] [address]    Culture   Final    NO GROWTH 5 DAYS Performed at [facility], [address], [place], [place] [address]    Report Status 03/12/2023 FINAL  Final    FURTHER DISCHARGE INSTRUCTIONS:   Get Medicines reviewed and adjusted: Please take all your medications with you for your next visit with your Primary MD   Laboratory/radiological data: Please request your Primary MD to go over all hospital tests and procedure/radiological results at the follow up, please ask your Primary MD to get all Hospital records sent to his/her office.   In some cases, they will be blood work, cultures and biopsy results pending at the time of your discharge. Please request that your primary care M.D. goes through all the records of your hospital data and follows up on these results.   Also Note the following: If you experience worsening of your admission symptoms, develop shortness of breath, life threatening emergency, suicidal or homicidal thoughts you must seek medical attention immediately by calling 911 or calling your MD immediately  if symptoms less severe.   You must read complete instructions/literature along with all the possible adverse reactions/side effects for all the Medicines you take and that have been prescribed to you. Take any new Medicines  after you have completely understood and accpet all the possible adverse reactions/side effects.    Do not drive when taking Pain medications or sleeping medications (Benzodaizepines)   Do not take more than prescribed Pain, Sleep and Anxiety Medications. It is not advisable to combine anxiety,sleep and pain medications without talking with your primary care practitioner   Special Instructions: If you have smoked or chewed Tobacco  in the last 2 yrs please stop smoking, stop any regular Alcohol  and or any Recreational drug use.   Wear Seat belts while driving.   Please note: You were cared for by a hospitalist during your hospital stay. Once you are discharged, your primary care physician will handle any further medical issues. Please note that NO REFILLS for any discharge medications will be authorized once you are discharged, as it is imperative that you return to your primary care physician (or establish a relationship with a primary care physician if you do not have one) for your post hospital discharge needs so that they can reassess your need for medications and monitor your lab values  Time coordinating discharge: Over 30 minutes  SIGNED:   Hughie Closs, MD  [facility] 03/12/2023, 2:07 PM *Please note that this is a verbal dictation therefore any spelling or  grammatical errors are due to the "Dragon Medical One" system interpretation. If 7PM-7AM, please contact night-coverage [email or web address]

## 2023-03-12 NOTE — Progress Notes (Signed)
PROGRESS NOTE    Kevin Doyle  BMW:[id number] DOB: September 04, 1950 DOA: 03/07/2023 PCP: Patient, No Pcp Per   Brief Narrative:  72 year old with history of paroxysmal A-fib, GERD comes to the hospital for evaluation of palpitations and fatigue.  In the ER patient was noted to be in atrial fibrillation with RVR therefore admitted to the hospital.  S/p cardioversion x 1 which was unsuccessful.    Assessment & Plan:   Principal Problem:   Atrial fibrillation with RVR (HCC) Active Problems:   Hypokalemia   Normocytic anemia   BPH (benign prostatic hyperplasia)   CAP (community acquired pneumonia)   A-fib (HCC)  Atrial fibrillation with RVR (HCC) Known history of paroxysmal atrial fibrillation not on any anticoagulation at home.  -cardioversion attempted on 8/23 without success.  Patient started on amiodarone drip, underwent another attempt of cardioversion which was successful.  He is in sinus rhythm now and feels much better.  Waiting for cardiology to clarify further plans.  He is still on amiodarone drip.  Multifocal infiltrate with new acute respiratory failure -?volume overload.  Respiratory panel COVID-19 is negative.   -BNP is elevated, procalcitonin negative and so is WBC count so initially doubted pneumonia and antibiotics were discontinued.  He is now complaining of some cough for last few days, chest pain with deep inspiration and had low-grade temperature of 100.2 early morning at 4 AM on 03/11/2023.  Repeat procalcitonin was also unremarkable.  Acute pulm edema/acute systolic congestive heart failure: Chest x-ray shows bilateral diffuse pulmonary edema and echo showed EF of 45 to 50%.  He received 1 dose of IV Lasix 60 mg yesterday and was started on 40 mg IV Lasix twice daily.  His creatinine jumped again today, he received 1 dose of Lasix yesterday.  May need to hold further Lasix.  BNP is improving.  He is on room air.   Vitamin B12 deficiency - Supplement  AKI: Creatinine  continues to rise and 1.73 today.  Avoid nephrotoxic agents.  May need to hold Lasix as well.   Normocytic anemia Borderline low saturation and ferritin.  Hemoglobin overall stable at 11 -supplement   Hypokalemia: Low again, will replace.  DVT prophylaxis: SCDs Start: 03/07/23 1956 Eliquis   Code Status: Full Code  Family Communication:  None present at bedside.  Plan of care discussed with patient in length and he/she verbalized understanding and agreed with it.  Status is: Inpatient Remains inpatient appropriate because: On amiodarone drip and creatinine rising   Estimated body mass index is 26.72 kg/m as calculated from the following:   Height as of this encounter: 6\' 2"  (1.88 m).   Weight as of this encounter: 94.4 kg.    Nutritional Assessment: Body mass index is 26.72 kg/m.Mar[place] Kitchen Seen by dietician.  I agree with the assessment and plan as outlined below: Nutrition Status:        . Skin Assessment: I have examined the patient's skin and I agree with the wound assessment as performed by the wound care RN as outlined below:    Consultants:  Cardiology  Procedures:  As above  Antimicrobials:  Anti-infectives (From admission, onward)    Start     Dose/Rate Route Frequency Ordered Stop   03/07/23 2030  cefTRIAXone (ROCEPHIN) 1 g in sodium chloride 0.9 % 100 mL IVPB  Status:  Discontinued        1 g 200 mL/hr over 30 Minutes Intravenous Every 24 hours 03/07/23 1929 03/08/23 1415   03/07/23 2030  azithromycin (  ZITHROMAX) 500 mg in sodium chloride 0.9 % 250 mL IVPB  Status:  Discontinued        500 mg 250 mL/hr over 60 Minutes Intravenous Every 24 hours 03/07/23 1929 03/08/23 1415         Subjective: Patient seen and examined after cardioversion.  He is feeling much better.  No chest pain or shortness of breath.  He is requesting Ambien for night since he received 1 last night and had a good sleep.  Objective: Vitals:   03/11/23 1705 03/11/23 2221 03/12/23 0018  03/12/23 0522  BP: 108/77 115/70 95/64 97/72   Pulse: (!) 137 (!) 126 (!) 110 (!) 121  Resp:  18 16   Temp:  98.7 F (37.1 C) 98.7 F (37.1 C)   TempSrc:  Oral Oral   SpO2: 95% 96% 93% 92%  Weight:      Height:        Intake/Output Summary (Last 24 hours) at 03/12/2023 0742 Last data filed at 03/12/2023 0300 Gross per 24 hour  Intake --  Output 1125 ml  Net -1125 ml   Filed Weights   03/07/23 1027  Weight: 94.4 kg    Examination:  General exam: Appears calm and comfortable  Respiratory system: Clear to auscultation. Respiratory effort normal. Cardiovascular system: S1 & S2 heard, RRR. No JVD, murmurs, rubs, gallops or clicks. No pedal edema. Gastrointestinal system: Abdomen is nondistended, soft and nontender. No organomegaly or masses felt. Normal bowel sounds heard. Central nervous system: Alert and oriented. No focal neurological deficits. Extremities: Symmetric 5 x 5 power. Skin: No rashes, lesions or ulcers.  Psychiatry: Judgement and insight appear normal. Mood & affect appropriate.    Data Reviewed: I have personally reviewed following labs and imaging studies  CBC: Recent Labs  Lab 03/08/23 0505 03/09/23 0438 03/10/23 0310 03/11/23 0616 03/12/23 0435  WBC 9.2 7.5 7.2 12.8* 7.9  HGB 11.0* 10.9* 11.9* 12.4* 10.7*  HCT 34.8* 33.3* 35.7* 36.9* 32.2*  MCV 96.9 92.0 92.0 90.2 88.2  PLT 195 226 255 344 241   Basic Metabolic Panel: Recent Labs  Lab 03/07/23 1030 03/08/23 0505 03/09/23 0438 03/10/23 0310 03/11/23 0616 03/12/23 0435  NA 138 136 136 136 137 134*  K 3.4* 4.0 3.4* 3.5 3.7 3.2*  CL 107 109 106 102 103 99  CO2 24 19* 22 23 21* 23  GLUCOSE 133* 120* 114* 108* 128* 117*  BUN 21 19 22 22  24* 32*  CREATININE 1.05 0.90 1.01 1.32* 1.64* 1.73*  CALCIUM 7.8* 7.9* 8.0* 8.0* 8.3* 8.2*  MG 1.8  --  1.9 2.0 1.7 1.8   GFR: Estimated Creatinine Clearance: 44.9 mL/min (A) (by C-G formula based on SCr of 1.73 mg/dL (H)). Liver Function Tests: Recent  Labs  Lab 03/07/23 1211  AST 16  ALT 17  ALKPHOS 83  BILITOT 3.7*  PROT 6.3*  ALBUMIN 3.2*   No results for input(s): "LIPASE", "AMYLASE" in the last 168 hours. No results for input(s): "AMMONIA" in the last 168 hours. Coagulation Profile: Recent Labs  Lab 03/07/23 1030 03/08/23 0505  INR 1.3* 1.5*   Cardiac Enzymes: No results for input(s): "CKTOTAL", "CKMB", "CKMBINDEX", "TROPONINI" in the last 168 hours. BNP (last 3 results) No results for input(s): "PROBNP" in the last 8760 hours. HbA1C: No results for input(s): "HGBA1C" in the last 72 hours. CBG: No results for input(s): "GLUCAP" in the last 168 hours. Lipid Profile: No results for input(s): "CHOL", "HDL", "LDLCALC", "TRIG", "CHOLHDL", "LDLDIRECT" in the last 72  hours. Thyroid Function Tests: No results for input(s): "TSH", "T4TOTAL", "FREET4", "T3FREE", "THYROIDAB" in the last 72 hours. Anemia Panel: Recent Labs    03/12/23 0435  RETICCTPCT 1.5    Sepsis Labs: Recent Labs  Lab 03/08/23 0504 03/11/23 0952  PROCALCITON <0.10 0.12    Recent Results (from the past 240 hour(s))  Respiratory (~20 pathogens) panel by PCR     Status: None   Collection Time: 03/07/23  7:23 PM   Specimen: Nasopharyngeal Swab; Respiratory  Result Value Ref Range Status   Adenovirus NOT DETECTED NOT DETECTED Final   Coronavirus 229E NOT DETECTED NOT DETECTED Final    Comment: (NOTE) The Coronavirus on the Respiratory Panel, DOES NOT test for the novel  Coronavirus (2019 nCoV)    Coronavirus HKU1 NOT DETECTED NOT DETECTED Final   Coronavirus NL63 NOT DETECTED NOT DETECTED Final   Coronavirus OC43 NOT DETECTED NOT DETECTED Final   Metapneumovirus NOT DETECTED NOT DETECTED Final   Rhinovirus / Enterovirus NOT DETECTED NOT DETECTED Final   Influenza A NOT DETECTED NOT DETECTED Final   Influenza B NOT DETECTED NOT DETECTED Final   Parainfluenza Virus 1 NOT DETECTED NOT DETECTED Final   Parainfluenza Virus 2 NOT DETECTED NOT  DETECTED Final   Parainfluenza Virus 3 NOT DETECTED NOT DETECTED Final   Parainfluenza Virus 4 NOT DETECTED NOT DETECTED Final   Respiratory Syncytial Virus NOT DETECTED NOT DETECTED Final   Bordetella pertussis NOT DETECTED NOT DETECTED Final   Bordetella Parapertussis NOT DETECTED NOT DETECTED Final   Chlamydophila pneumoniae NOT DETECTED NOT DETECTED Final   Mycoplasma pneumoniae NOT DETECTED NOT DETECTED Final    Comment: Performed at [facility], [address] [place], [place] [address]  SARS Coronavirus 2 by RT PCR (hospital order, performed in [facility] hospital lab) *cepheid single result test* Anterior Nasal Swab     Status: None   Collection Time: 03/07/23  7:23 PM   Specimen: Anterior Nasal Swab  Result Value Ref Range Status   SARS Coronavirus 2 by RT PCR NEGATIVE NEGATIVE Final    Comment: (NOTE) SARS-CoV-2 target nucleic acids are NOT DETECTED.  The SARS-CoV-2 RNA is generally detectable in upper and lower respiratory specimens during the acute phase of infection. The lowest concentration of SARS-CoV-2 viral copies this assay can detect is 250 copies / mL. A negative result does not preclude SARS-CoV-2 infection and should not be used as the sole basis for treatment or other patient management decisions.  A negative result may occur with improper specimen collection / handling, submission of specimen other than nasopharyngeal swab, presence of viral mutation(s) within the areas targeted by this assay, and inadequate number of viral copies (<250 copies / mL). A negative result must be combined with clinical observations, patient history, and epidemiological information.  Fact Sheet for Patients:   RoadLapTop.co.za  Fact Sheet for Healthcare Providers: http://kim-miller.com/  This test is not yet approved or  cleared by the [place] FDA and has been authorized for detection and/or diagnosis of SARS-CoV-2  by FDA under an Emergency Use Authorization (EUA).  This EUA will remain in effect (meaning this test can be used) for the duration of the COVID-19 declaration under Section 564(b)(1) of the Act, 21 U.S.C. section 360bbb-3(b)(1), unless the authorization is terminated or revoked sooner.  Performed at [facility], [address] [place], [place] [address]   Culture, blood (Routine X 2) w Reflex to ID Panel     Status: None (Preliminary  result)   Collection Time: 03/07/23  9:23 PM   Specimen: BLOOD RIGHT ARM  Result Value Ref Range Status   Specimen Description   Final    BLOOD RIGHT ARM Performed at [facility], [address] [place], [place] [address]    Special Requests   Final    BOTTLES DRAWN AEROBIC AND ANAEROBIC Blood Culture results may not be optimal due to an inadequate volume of blood received in culture bottles Performed at [facility], [address], [place], [place] [address]    Culture   Final    NO GROWTH 4 DAYS Performed at [facility], [address], [place], [place] [address]    Report Status PENDING  Incomplete  Culture, blood (Routine X 2) w Reflex to ID Panel     Status: None (Preliminary result)   Collection Time: 03/07/23  9:23 PM   Specimen: BLOOD LEFT HAND  Result Value Ref Range Status   Specimen Description   Final    BLOOD LEFT HAND Performed at [facility], [address], [place], [place] [address]    Special Requests   Final    BOTTLES DRAWN AEROBIC AND ANAEROBIC Blood Culture results may not be optimal due to an inadequate volume of blood received in culture bottles Performed at [facility], [address], [place], [place] [address]    Culture   Final    NO GROWTH 4 DAYS Performed at [facility], [address], [place], [place] [address]    Report Status PENDING  Incomplete     Radiology Studies: DG CHEST PORT 1 VIEW  Result Date:  03/11/2023 CLINICAL DATA:  CHF EXAM: PORTABLE CHEST 1 VIEW COMPARISON:  03/09/2023 FINDINGS: Mild cardiomegaly. Small, layering bilateral pleural effusions. No acute appearing airspace opacity. The visualized skeletal structures are unremarkable. IMPRESSION: Mild cardiomegaly. Small, layering bilateral pleural effusions. No acute appearing airspace opacity. Electronically Signed   By: Jearld Lesch M.D.   On: 03/11/2023 17:27    Scheduled Meds:  acidophilus  2 capsule Oral Daily   apixaban  5 mg Oral BID   docusate sodium  100 mg Oral BID   ferrous sulfate  325 mg Oral Q breakfast   metoprolol succinate  100 mg Oral QHS   sodium chloride flush  3 mL Intravenous Q12H   vitamin B-12  500 mcg Oral Daily   Continuous Infusions:  amiodarone 30 mg/hr (03/11/23 2312)   potassium chloride       LOS: 5 days   Hughie Closs, MD [facility]  03/12/2023, 7:42 AM   *Please note that this is a verbal dictation therefore any spelling or grammatical errors are due to the "Dragon Medical One" system interpretation.  Please page via Amion and do not message via secure chat for urgent patient care matters. Secure chat can be used for non urgent patient care matters.  How to contact the [facility] Attending or Consulting provider 7A - 7P or covering provider during after hours 7P -7A, for this patient?  Check the care team in [facility] and look for a) attending/consulting [facility] provider listed and b) the [facility] team listed. Page or secure chat 7A-7P. Log into [email or web address] and use [place]'s universal password to access. If you do not have the password, please contact the hospital operator. Locate the [facility] provider you are looking for under [facility] and page to a number that you can be directly reached. If  you still have difficulty reaching the provider, please page the [facility] (Director on Call) for the Hospitalists listed on amion for assistance.

## 2023-03-12 NOTE — Progress Notes (Signed)
Informed Consent   Shared Decision Making/Informed Consent The risks [stroke (1 in 1000), death (1 in 1000), kidney failure [usually temporary] (1 in 500), bleeding (1 in 200), allergic reaction [possibly serious] (1 in 200)], benefits (diagnostic support and management of coronary artery disease) and alternatives of a cardiac catheterization were discussed in detail with Kevin Doyle and he is willing to proceed.  Repeat DCCV. Has had previous TEE this admission.

## 2023-03-12 NOTE — Telephone Encounter (Signed)
Patient stated he is being discharged from the hospital and he will need to have his creatinine levels checked.  Patient wants to get lab orders for the creatinine test.

## 2023-03-13 ENCOUNTER — Encounter (HOSPITAL_COMMUNITY): Payer: Self-pay | Admitting: Cardiology

## 2023-03-13 ENCOUNTER — Other Ambulatory Visit: Payer: Self-pay

## 2023-03-13 ENCOUNTER — Telehealth: Payer: Self-pay | Admitting: Cardiology

## 2023-03-13 DIAGNOSIS — N179 Acute kidney failure, unspecified: Secondary | ICD-10-CM

## 2023-03-13 LAB — HAPTOGLOBIN: Haptoglobin: 327 mg/dL (ref 34–355)

## 2023-03-13 NOTE — Telephone Encounter (Signed)
Returned call to pt. Confirmed with pt to take his metoprolol after dinner but to take it at the same time each day. Pt verbalized understanding.

## 2023-03-13 NOTE — Telephone Encounter (Signed)
Pt c/o medication issue:  1. Name of Medication:   metoprolol succinate (TOPROL-XL) 100 MG 24 hr tablet    2. How are you currently taking this medication (dosage and times per day)? Take 1 tablet (100 mg total) by mouth at bedtime. Take with or immediately following a meal.   3. Are you having a reaction (difficulty breathing--STAT)? No  4. What is your medication issue? The patient is calling to inform us of his BP this morning 7 AM 121/66 HR 74. This medication stated to take at bedtime and then states to take with a meal. The patient would like clarification on these instructions. Please advise.

## 2023-03-13 NOTE — Anesthesia Postprocedure Evaluation (Signed)
Anesthesia Post Note  Patient: Kevin Doyle  Procedure(s) Performed: CARDIOVERSION     Patient location during evaluation: Cath Lab Anesthesia Type: General Level of consciousness: sedated and patient cooperative Pain management: pain level controlled Vital Signs Assessment: post-procedure vital signs reviewed and stable Respiratory status: spontaneous breathing Cardiovascular status: stable Anesthetic complications: no   No notable events documented.  Last Vitals:  Vitals:   03/12/23 0921 03/12/23 1306  BP: (!) 107/95 112/67  Pulse: 85 81  Resp: 20 18  Temp: 36.6 C 36.6 C  SpO2: 99% 99%    Last Pain:  Vitals:   03/12/23 1306  TempSrc: Oral  PainSc:                  [name]

## 2023-03-16 LAB — BASIC METABOLIC PANEL
BUN/Creatinine Ratio: 11 (ref 10–24)
BUN: 15 mg/dL (ref 8–27)
CO2: 25 mmol/L (ref 20–29)
Calcium: 8.8 mg/dL (ref 8.6–10.2)
Chloride: 105 mmol/L (ref 96–106)
Creatinine, Ser: 1.32 mg/dL — ABNORMAL HIGH (ref 0.76–1.27)
Glucose: 88 mg/dL (ref 70–99)
Potassium: 5.2 mmol/L (ref 3.5–5.2)
Sodium: 138 mmol/L (ref 134–144)
eGFR: 57 mL/min/{1.73_m2} — ABNORMAL LOW (ref >59)

## 2023-03-17 ENCOUNTER — Telehealth: Payer: Self-pay | Admitting: Cardiology

## 2023-03-17 NOTE — Telephone Encounter (Signed)
Patient called the answering service this AM reporting that he is back in afib. He was recently admitted to [facility] hospital from 8/21-8/26 with afib. Was loaded with amiodarone and underwent successful DCCV on 8/26. Since discharge, he has been on Amiodarone 400 mg BID, metoprolol succinate 100 mg every evening, and eliquis 5 mg BID. His amiodarone dose is suppose to decrease to 200 mg daily tomorrow.   Last night, he was watching TV when he felt his heart "summersault" in his chest. Could then feel his heart beat normally for a few moments before "summersaulting" again. The, he was aware that his heart was beating irregularly. He went to bed, and when he woke up this AM his heart beat was 118 BPM. BP 116/81. He denies chest pain, shortness of breath, dizziness/lightheadedness, syncope or near syncope. Is aware that his heart is beating irregularly, but otherwise feels OK   I instructed patient to continue amiodarone 400 mg BID until his follow up appointment on 9/3. I also instructed him to take an additional metoprolol succinate 50 mg every AM in addition to the 100 mg every PM. Discussed that since he is on eliquis,  he is protected from a stroke when in Afib. Instructed him to check his BP and HR throughout the day today, and if he develops lightheadedness, syncope, near syncope, or chest pain to go to the ED for evaluation   Jonita Albee, PA-C 03/17/2023 9:19 AM

## 2023-03-18 NOTE — Progress Notes (Unsigned)
Cardiology Clinic Note   Date: 03/20/2023 ID: Kern, Aho [birth year], MRN [id number]  Primary Cardiologist:  Rollene Rotunda, MD  Patient Profile    Kevin Doyle is a 72 y.o. male who presents to the clinic today for hospital follow up.     Past medical history significant for: PAF. Onset with isolated episode 1998. Repeat isolated episode 2015. 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  Global hypokinesis.  Diastolic parameters could not be evaluated.  Normal RV function.  Moderately elevated PA pressure.  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  Was first evaluated by Dr. Antoine Poche on 12/05/2013 for A-fib. Orthostatic hypotension.     History of Present Illness    Kevin Doyle was first evaluated by Dr. Antoine Poche on 12/05/2013 for A-fib.  At that time it was felt this was an isolated incident related to vertigo.  Patient presented to the ED on 03/07/2023 with palpitations.  He was found to have be in A-fib with RVR and started on Cardizem drip.  He was admitted for further evaluation.  He underwent TEE/DCCV with return of A-fib and he was started on amiodarone.  He underwent another DCCV and was successfully converted to NSR.  He was discharged on 03/12/2023 in NSR on amiodarone, metoprolol, Eliquis.  Patient contacted the office on 03/17/2023 with complaints of palpitations.  Per Robet Leu, PA-C: "Last night, he was watching TV when he felt his heart "summersault" in his chest. Could then feel his heart beat normally for a few moments before "summersaulting" again. The, he was aware that his heart was beating irregularly. He went to bed, and when he woke up this AM his heart beat was 118 BPM. BP 116/81. He denies chest pain, shortness of breath, dizziness/lightheadedness, syncope or near syncope. Is aware that his heart is beating irregularly, but otherwise feels OK.  I instructed patient to continue amiodarone 400 mg BID until his follow up appointment on  9/3. I also instructed him to take an additional metoprolol succinate 50 mg every AM in addition to the 100 mg every PM. Discussed that since he is on eliquis,  he is protected from a stroke when in Afib. Instructed him to check his BP and HR throughout the day today, and if he develops lightheadedness, syncope, near syncope, or chest pain to go to the ED for evaluation."  Today, patient is accompanied by his wife. He is in normal rhythm today. He reports episode of afib broke at around 1030 pm on 9/1. His heart rate was between 104-118 throughout the episode. He has had no further episodes since and heart rate has been in the 60s. He reports feeling fatigued since getting out of the hospital. Prior to hospital admission he was very active around his home doing yard work and other household activities. Patient denies shortness of breath or dyspnea on exertion. No chest pain, pressure, or tightness. Denies lower extremity edema, orthopnea, or PND. He is very interested in pursing an ablation. He has concerns being on Eliquis, as he has a history of nosebleeds. He denies blood in stool or urine or recent nose bleeds.     ROS: All other systems reviewed and are otherwise negative except as noted in History of Present Illness.  Studies Reviewed    EKG not ordered today.   Risk Assessment/Calculations     CHA2DS2-VASc Score = 1   This indicates a 0.6% annual risk of stroke. The patient's score is  based upon: CHF History: 0 HTN History: 0 Diabetes History: 0 Stroke History: 0 Vascular Disease History: 0 Age Score: 1 Gender Score: 0     HYPERTENSION CONTROL Vitals:   03/20/23 1437 03/20/23 1623  BP: (!) 152/84 (!) 140/82    The patient's blood pressure is elevated above target today.  In order to address the patient's elevated BP: The blood pressure is usually elevated in clinic.  Blood pressures monitored at home have been optimal.           Physical Exam    VS:  BP (!) 152/84    Pulse 67   Ht 6\' 2"  (1.88 m)   Wt 203 lb 6.4 oz (92.3 kg)   SpO2 98%   BMI 26.12 kg/m  , BMI Body mass index is 26.12 kg/m.  GEN: Well nourished, well developed, in no acute distress. Neck: No JVD or carotid bruits. Cardiac:  RRR. No murmurs. No rubs or gallops.   Respiratory:  Respirations regular and unlabored. Clear to auscultation without rales, wheezing or rhonchi. GI: Soft, nontender, nondistended. Extremities: Radials/DP/PT 2+ and equal bilaterally. No clubbing or cyanosis. No edema.  Skin: Warm and dry, no rash. Neuro: Strength intact.  Assessment & Plan    PAF.  Onset with an isolated episode 1998, repeat isolated episode 2015.  Patient underwent hospital admission 03/07/2023 to 03/12/2023 with A-fib with RVR.  First attempt at DCCV was unsuccessful.  He was started on amiodarone and underwent an additional attempt with DCCV and converted to normal sinus rhythm.  He contacted the office on 03/17/2023 with complaints of palpitations and was instructed to continue amiodarone 400 mg twice daily until follow-up.  He was also instructed to take an additional metoprolol succinate 50 mg in the morning and 100 mg in the p.m.  Patient reports episode of afib broke about 24 hours after it started (around 1030 pm 9/1). HR was between 104-118 while in afib and has been in the 60s since. Denies spontaneous bleeding concerns. RRR on exam today. HR 67 bpm.  Patient will start amiodarone 200 mg daily tomorrow. He will continue metoprolol 50 mg in the am and 100 mg in the pm. If his heart rate continues in the 60s or lower he can cut out the morning dose of metoprolol. Continue Eliquis.  Will refer to EP. 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  Patient denies lower extremity edema, shortness of breath, DOE, orthopnea or PND. Euvolemic and well compensated on exam.  Continue metoprolol.  Disposition: Decrease amiodarone to 200 mg daily starting  tomorrow. Continue Toprol 50 mg in the am and 100 mg in the pm. If heart rate <60 may stop am dose of Toprol. Refer to EP. Return in 3-4 months or sooner as needed.          Signed, Etta Grandchild. Jimmy Plessinger, DNP, NP-C

## 2023-03-20 ENCOUNTER — Ambulatory Visit (INDEPENDENT_AMBULATORY_CARE_PROVIDER_SITE_OTHER): Payer: Medicare Other | Admitting: Student

## 2023-03-20 ENCOUNTER — Encounter: Payer: Self-pay | Admitting: Student

## 2023-03-20 VITALS — BP 140/82 | HR 67 | Ht 74.0 in | Wt 203.4 lb

## 2023-03-20 DIAGNOSIS — I480 Paroxysmal atrial fibrillation: Secondary | ICD-10-CM | POA: Insufficient documentation

## 2023-03-20 DIAGNOSIS — I5022 Chronic systolic (congestive) heart failure: Secondary | ICD-10-CM | POA: Insufficient documentation

## 2023-03-20 DIAGNOSIS — N132 Hydronephrosis with renal and ureteral calculous obstruction: Secondary | ICD-10-CM | POA: Diagnosis not present

## 2023-03-20 MED ORDER — METOPROLOL TARTRATE 100 MG PO TABS: ORAL_TABLET | ORAL | 3 refills | Status: DC

## 2023-03-20 MED ORDER — AMIODARONE HCL 200 MG PO TABS: 200.0000 mg | ORAL_TABLET | Freq: Every day | ORAL | 3 refills | Status: DC

## 2023-03-20 NOTE — Patient Instructions (Addendum)
Medication Instructions:  You will begin Amiodarone 200mg  daily.  You will begin taking Metoprolol 50mg  (one half tablet) in the morning and 100mg  (one whole tablet) in the evening.  *If you need a refill on your cardiac medications before your next appointment, please call your pharmacy*   Lab Work: If you have labs (blood work) drawn today and your tests are completely normal, you will receive your results only by: MyChart Message (if you have MyChart) OR A paper copy in the mail If you have any lab test that is abnormal or we need to change your treatment, we will call you to review the results.  Follow-Up: At [facility], you and your health needs are our priority.  As part of our continuing mission to provide you with exceptional heart care, we have created designated Provider Care Teams.  These Care Teams include your primary Cardiologist (physician) and Advanced Practice Providers (APPs -  Physician Assistants and Nurse Practitioners) who all work together to provide you with the care you need, when you need it.  We recommend signing up for the patient portal called "MyChart".  Sign up information is provided on this After Visit Summary.  MyChart is used to connect with patients for Virtual Visits (Telemedicine).  Patients are able to view lab/test results, encounter notes, upcoming appointments, etc.  Non-urgent messages can be sent to your provider as well.   To learn more about what you can do with MyChart, go to [email or web address].    Your next appointment:   3-4 month(s)  Provider:   Rollene Rotunda, MD

## 2023-03-22 ENCOUNTER — Observation Stay (HOSPITAL_COMMUNITY): Payer: Medicare Other

## 2023-03-22 ENCOUNTER — Encounter (HOSPITAL_COMMUNITY)
Admission: EM | Disposition: A | Discharge status: Home / Self Care | Payer: Self-pay | Source: Home / Self Care | Attending: Internal Medicine

## 2023-03-22 ENCOUNTER — Other Ambulatory Visit: Payer: Self-pay

## 2023-03-22 ENCOUNTER — Other Ambulatory Visit: Payer: Self-pay | Admitting: Urology

## 2023-03-22 ENCOUNTER — Encounter (HOSPITAL_COMMUNITY): Payer: Self-pay

## 2023-03-22 ENCOUNTER — Observation Stay (HOSPITAL_COMMUNITY): Payer: Medicare Other | Admitting: Anesthesiology

## 2023-03-22 ENCOUNTER — Inpatient Hospital Stay (HOSPITAL_COMMUNITY)
Admission: EM | Admit: 2023-03-22 | Discharge: 2023-03-25 | DRG: 660 | Disposition: A | Discharge status: Home / Self Care | Payer: Medicare Other | Attending: Internal Medicine | Admitting: Internal Medicine

## 2023-03-22 ENCOUNTER — Emergency Department (HOSPITAL_COMMUNITY): Payer: Medicare Other

## 2023-03-22 DIAGNOSIS — N132 Hydronephrosis with renal and ureteral calculous obstruction: Secondary | ICD-10-CM

## 2023-03-22 DIAGNOSIS — Z801 Family history of malignant neoplasm of trachea, bronchus and lung: Secondary | ICD-10-CM

## 2023-03-22 DIAGNOSIS — R7989 Other specified abnormal findings of blood chemistry: Secondary | ICD-10-CM | POA: Diagnosis present

## 2023-03-22 DIAGNOSIS — K219 Gastro-esophageal reflux disease without esophagitis: Secondary | ICD-10-CM | POA: Diagnosis present

## 2023-03-22 DIAGNOSIS — Z87891 Personal history of nicotine dependence: Secondary | ICD-10-CM

## 2023-03-22 DIAGNOSIS — N400 Enlarged prostate without lower urinary tract symptoms: Secondary | ICD-10-CM | POA: Diagnosis present

## 2023-03-22 DIAGNOSIS — Z79899 Other long term (current) drug therapy: Secondary | ICD-10-CM

## 2023-03-22 DIAGNOSIS — N171 Acute kidney failure with acute cortical necrosis: Secondary | ICD-10-CM

## 2023-03-22 DIAGNOSIS — N401 Enlarged prostate with lower urinary tract symptoms: Secondary | ICD-10-CM | POA: Diagnosis present

## 2023-03-22 DIAGNOSIS — I3139 Other pericardial effusion (noninflammatory): Secondary | ICD-10-CM | POA: Diagnosis present

## 2023-03-22 DIAGNOSIS — I951 Orthostatic hypotension: Secondary | ICD-10-CM | POA: Diagnosis present

## 2023-03-22 DIAGNOSIS — Z87442 Personal history of urinary calculi: Secondary | ICD-10-CM

## 2023-03-22 DIAGNOSIS — I5022 Chronic systolic (congestive) heart failure: Secondary | ICD-10-CM | POA: Diagnosis present

## 2023-03-22 DIAGNOSIS — R310 Gross hematuria: Secondary | ICD-10-CM | POA: Diagnosis not present

## 2023-03-22 DIAGNOSIS — E8809 Other disorders of plasma-protein metabolism, not elsewhere classified: Secondary | ICD-10-CM | POA: Diagnosis present

## 2023-03-22 DIAGNOSIS — Z9049 Acquired absence of other specified parts of digestive tract: Secondary | ICD-10-CM

## 2023-03-22 DIAGNOSIS — I4891 Unspecified atrial fibrillation: Secondary | ICD-10-CM | POA: Diagnosis present

## 2023-03-22 DIAGNOSIS — Z7901 Long term (current) use of anticoagulants: Secondary | ICD-10-CM

## 2023-03-22 DIAGNOSIS — I429 Cardiomyopathy, unspecified: Secondary | ICD-10-CM | POA: Diagnosis present

## 2023-03-22 DIAGNOSIS — D649 Anemia, unspecified: Secondary | ICD-10-CM | POA: Diagnosis present

## 2023-03-22 DIAGNOSIS — N179 Acute kidney failure, unspecified: Secondary | ICD-10-CM

## 2023-03-22 DIAGNOSIS — Z808 Family history of malignant neoplasm of other organs or systems: Secondary | ICD-10-CM

## 2023-03-22 DIAGNOSIS — Z8249 Family history of ischemic heart disease and other diseases of the circulatory system: Secondary | ICD-10-CM

## 2023-03-22 DIAGNOSIS — N1330 Unspecified hydronephrosis: Secondary | ICD-10-CM

## 2023-03-22 DIAGNOSIS — N201 Calculus of ureter: Principal | ICD-10-CM

## 2023-03-22 DIAGNOSIS — R32 Unspecified urinary incontinence: Secondary | ICD-10-CM | POA: Diagnosis present

## 2023-03-22 DIAGNOSIS — I4819 Other persistent atrial fibrillation: Secondary | ICD-10-CM | POA: Diagnosis present

## 2023-03-22 DIAGNOSIS — Z905 Acquired absence of kidney: Secondary | ICD-10-CM

## 2023-03-22 HISTORY — PX: CYSTOSCOPY/URETEROSCOPY/HOLMIUM LASER/STENT PLACEMENT: SHX6546

## 2023-03-22 LAB — COMPREHENSIVE METABOLIC PANEL
ALT: 21 U/L (ref 0–44)
AST: 16 U/L (ref 15–41)
Albumin: 3.2 g/dL — ABNORMAL LOW (ref 3.5–5.0)
Alkaline Phosphatase: 92 U/L (ref 38–126)
Anion gap: 12 (ref 5–15)
BUN: 29 mg/dL — ABNORMAL HIGH (ref 8–23)
CO2: 20 mmol/L — ABNORMAL LOW (ref 22–32)
Calcium: 8.7 mg/dL — ABNORMAL LOW (ref 8.9–10.3)
Chloride: 104 mmol/L (ref 98–111)
Creatinine, Ser: 1.9 mg/dL — ABNORMAL HIGH (ref 0.61–1.24)
GFR, Estimated: 37 mL/min — ABNORMAL LOW (ref >60)
Glucose, Bld: 123 mg/dL — ABNORMAL HIGH (ref 70–99)
Potassium: 4 mmol/L (ref 3.5–5.1)
Sodium: 136 mmol/L (ref 135–145)
Total Bilirubin: 1.4 mg/dL — ABNORMAL HIGH (ref 0.3–1.2)
Total Protein: 7 g/dL (ref 6.5–8.1)

## 2023-03-22 LAB — CBC WITH DIFFERENTIAL/PLATELET
Abs Immature Granulocytes: 0.07 10*3/uL (ref 0.00–0.07)
Basophils Absolute: 0 10*3/uL (ref 0.0–0.1)
Basophils Relative: 0 %
Eosinophils Absolute: 0 10*3/uL (ref 0.0–0.5)
Eosinophils Relative: 0 %
HCT: 33.5 % — ABNORMAL LOW (ref 39.0–52.0)
Hemoglobin: 11.1 g/dL — ABNORMAL LOW (ref 13.0–17.0)
Immature Granulocytes: 1 %
Lymphocytes Relative: 5 %
Lymphs Abs: 0.6 10*3/uL — ABNORMAL LOW (ref 0.7–4.0)
MCH: 30.3 pg (ref 26.0–34.0)
MCHC: 33.1 g/dL (ref 30.0–36.0)
MCV: 91.5 fL (ref 80.0–100.0)
Monocytes Absolute: 0.9 10*3/uL (ref 0.1–1.0)
Monocytes Relative: 7 %
Neutro Abs: 10.8 10*3/uL — ABNORMAL HIGH (ref 1.7–7.7)
Neutrophils Relative %: 87 %
Platelets: 420 10*3/uL — ABNORMAL HIGH (ref 150–400)
RBC: 3.66 MIL/uL — ABNORMAL LOW (ref 4.22–5.81)
RDW: 12.3 % (ref 11.5–15.5)
WBC: 12.4 10*3/uL — ABNORMAL HIGH (ref 4.0–10.5)
nRBC: 0 % (ref 0.0–0.2)

## 2023-03-22 LAB — URINALYSIS, ROUTINE W REFLEX MICROSCOPIC
Bilirubin Urine: NEGATIVE
Glucose, UA: NEGATIVE mg/dL
Ketones, ur: 5 mg/dL — AB
Leukocytes,Ua: NEGATIVE
Nitrite: NEGATIVE
Protein, ur: NEGATIVE mg/dL
Specific Gravity, Urine: 1.017 (ref 1.005–1.030)
pH: 5 (ref 5.0–8.0)

## 2023-03-22 LAB — MAGNESIUM: Magnesium: 2.1 mg/dL (ref 1.7–2.4)

## 2023-03-22 LAB — LIPASE, BLOOD: Lipase: 24 U/L (ref 11–51)

## 2023-03-22 LAB — PHOSPHORUS: Phosphorus: 3 mg/dL (ref 2.5–4.6)

## 2023-03-22 SURGERY — CYSTOSCOPY/URETEROSCOPY/HOLMIUM LASER/STENT PLACEMENT
Anesthesia: General | Site: Bladder | Laterality: Bilateral

## 2023-03-22 MED ORDER — APIXABAN 5 MG PO TABS: 5.0000 mg | ORAL_TABLET | Freq: Two times a day (BID) | ORAL | Status: DC

## 2023-03-22 MED ORDER — ACETAMINOPHEN 650 MG RE SUPP: 650.0000 mg | Freq: Four times a day (QID) | RECTAL | Status: DC | PRN

## 2023-03-22 MED ORDER — IOHEXOL 300 MG/ML  SOLN
INTRAMUSCULAR | Status: DC | PRN
  Administered 2023-03-22: 26 mL

## 2023-03-22 MED ORDER — METOPROLOL TARTRATE 50 MG PO TABS
100.0000 mg | ORAL_TABLET | Freq: Every day | ORAL | Status: DC
  Administered 2023-03-23: 100 mg via ORAL
  Filled 2023-03-22 (×2): qty 2

## 2023-03-22 MED ORDER — PROPOFOL 10 MG/ML IV BOLUS
INTRAVENOUS | Status: AC
  Filled 2023-03-22: qty 20

## 2023-03-22 MED ORDER — METOPROLOL TARTRATE 50 MG PO TABS: 100.0000 mg | ORAL_TABLET | Freq: Every day | ORAL | Status: DC

## 2023-03-22 MED ORDER — FENTANYL CITRATE PF 50 MCG/ML IJ SOSY: 25.0000 ug | PREFILLED_SYRINGE | INTRAMUSCULAR | Status: DC | PRN

## 2023-03-22 MED ORDER — OXYCODONE HCL 5 MG PO TABS: 5.0000 mg | ORAL_TABLET | Freq: Once | ORAL | Status: DC | PRN

## 2023-03-22 MED ORDER — CEFDINIR 300 MG PO CAPS
300.0000 mg | ORAL_CAPSULE | Freq: Two times a day (BID) | ORAL | Status: DC
  Administered 2023-03-22 – 2023-03-25 (×6): 300 mg via ORAL
  Filled 2023-03-22 (×6): qty 1

## 2023-03-22 MED ORDER — ONDANSETRON HCL 4 MG/2ML IJ SOLN
4.0000 mg | Freq: Once | INTRAMUSCULAR | Status: AC
  Administered 2023-03-22: 4 mg via INTRAVENOUS
  Filled 2023-03-22: qty 2

## 2023-03-22 MED ORDER — LACTATED RINGERS IV SOLN: INTRAVENOUS | Status: DC

## 2023-03-22 MED ORDER — AMIODARONE HCL 200 MG PO TABS
200.0000 mg | ORAL_TABLET | Freq: Every day | ORAL | Status: DC
  Administered 2023-03-22: 200 mg via ORAL
  Filled 2023-03-22: qty 1

## 2023-03-22 MED ORDER — LIDOCAINE HCL (PF) 2 % IJ SOLN
INTRAMUSCULAR | Status: AC
  Filled 2023-03-22: qty 5

## 2023-03-22 MED ORDER — LIDOCAINE HCL URETHRAL/MUCOSAL 2 % EX GEL
CUTANEOUS | Status: AC
  Filled 2023-03-22: qty 30

## 2023-03-22 MED ORDER — CHLORHEXIDINE GLUCONATE 0.12 % MT SOLN
15.0000 mL | Freq: Once | OROMUCOSAL | Status: AC
  Administered 2023-03-22: 15 mL via OROMUCOSAL

## 2023-03-22 MED ORDER — PHENYLEPHRINE 80 MCG/ML (10ML) SYRINGE FOR IV PUSH (FOR BLOOD PRESSURE SUPPORT)
PREFILLED_SYRINGE | INTRAVENOUS | Status: AC
  Filled 2023-03-22: qty 10

## 2023-03-22 MED ORDER — ORAL CARE MOUTH RINSE: 15.0000 mL | Freq: Once | OROMUCOSAL | Status: AC

## 2023-03-22 MED ORDER — ONDANSETRON HCL 4 MG/2ML IJ SOLN
INTRAMUSCULAR | Status: AC
  Filled 2023-03-22: qty 2

## 2023-03-22 MED ORDER — LIDOCAINE HCL URETHRAL/MUCOSAL 2 % EX GEL
CUTANEOUS | Status: DC | PRN
  Administered 2023-03-22: 1 via URETHRAL

## 2023-03-22 MED ORDER — CHLORHEXIDINE GLUCONATE 0.12 % MT SOLN: 15.0000 mL | Freq: Once | OROMUCOSAL | Status: AC

## 2023-03-22 MED ORDER — ONDANSETRON HCL 4 MG/2ML IJ SOLN
INTRAMUSCULAR | Status: DC | PRN
  Administered 2023-03-22: 4 mg via INTRAVENOUS

## 2023-03-22 MED ORDER — LIDOCAINE HCL URETHRAL/MUCOSAL 2 % EX GEL
CUTANEOUS | Status: AC
  Filled 2023-03-22: qty 5

## 2023-03-22 MED ORDER — METOPROLOL TARTRATE 25 MG PO TABS
100.0000 mg | ORAL_TABLET | Freq: Every day | ORAL | Status: DC
  Administered 2023-03-22: 100 mg via ORAL
  Filled 2023-03-22: qty 4

## 2023-03-22 MED ORDER — CEFAZOLIN SODIUM-DEXTROSE 2-4 GM/100ML-% IV SOLN
2.0000 g | Freq: Once | INTRAVENOUS | Status: AC
  Administered 2023-03-22: 2 g via INTRAVENOUS

## 2023-03-22 MED ORDER — FENTANYL CITRATE (PF) 100 MCG/2ML IJ SOLN
INTRAMUSCULAR | Status: AC
  Filled 2023-03-22: qty 2

## 2023-03-22 MED ORDER — AMIODARONE HCL 200 MG PO TABS
200.0000 mg | ORAL_TABLET | Freq: Every day | ORAL | Status: DC
  Administered 2023-03-23 – 2023-03-25 (×3): 200 mg via ORAL
  Filled 2023-03-22 (×3): qty 1

## 2023-03-22 MED ORDER — EPHEDRINE 5 MG/ML INJ
INTRAVENOUS | Status: AC
  Filled 2023-03-22: qty 5

## 2023-03-22 MED ORDER — PHENYLEPHRINE 80 MCG/ML (10ML) SYRINGE FOR IV PUSH (FOR BLOOD PRESSURE SUPPORT)
PREFILLED_SYRINGE | INTRAVENOUS | Status: DC | PRN
  Administered 2023-03-22: 160 ug via INTRAVENOUS
  Administered 2023-03-22: 80 ug via INTRAVENOUS
  Administered 2023-03-22: 160 ug via INTRAVENOUS

## 2023-03-22 MED ORDER — TAMSULOSIN HCL 0.4 MG PO CAPS
0.4000 mg | ORAL_CAPSULE | Freq: Every day | ORAL | Status: DC
  Administered 2023-03-22 – 2023-03-24 (×3): 0.4 mg via ORAL
  Filled 2023-03-22 (×3): qty 1

## 2023-03-22 MED ORDER — SODIUM CHLORIDE 0.9 % IR SOLN
Status: DC | PRN
  Administered 2023-03-22: 6000 mL via INTRAVESICAL

## 2023-03-22 MED ORDER — SODIUM CHLORIDE 0.9 % IV BOLUS
500.0000 mL | Freq: Once | INTRAVENOUS | Status: AC
  Administered 2023-03-22: 500 mL via INTRAVENOUS

## 2023-03-22 MED ORDER — ACETAMINOPHEN 325 MG PO TABS: 650.0000 mg | ORAL_TABLET | Freq: Four times a day (QID) | ORAL | Status: DC | PRN

## 2023-03-22 MED ORDER — MORPHINE SULFATE (PF) 4 MG/ML IV SOLN
4.0000 mg | Freq: Once | INTRAVENOUS | Status: AC
  Administered 2023-03-22: 4 mg via INTRAVENOUS
  Filled 2023-03-22: qty 1

## 2023-03-22 MED ORDER — CEFAZOLIN SODIUM-DEXTROSE 2-4 GM/100ML-% IV SOLN
INTRAVENOUS | Status: AC
  Filled 2023-03-22: qty 100

## 2023-03-22 MED ORDER — HYDROMORPHONE HCL 1 MG/ML IJ SOLN
0.5000 mg | INTRAMUSCULAR | Status: DC | PRN
  Administered 2023-03-22: 0.5 mg via INTRAVENOUS
  Filled 2023-03-22: qty 0.5

## 2023-03-22 MED ORDER — OXYCODONE HCL 5 MG PO TABS: 5.0000 mg | ORAL_TABLET | Freq: Four times a day (QID) | ORAL | Status: DC | PRN

## 2023-03-22 MED ORDER — DEXAMETHASONE SODIUM PHOSPHATE 10 MG/ML IJ SOLN
INTRAMUSCULAR | Status: AC
  Filled 2023-03-22: qty 1

## 2023-03-22 MED ORDER — SODIUM CHLORIDE 0.45 % IV SOLN: INTRAVENOUS | Status: AC

## 2023-03-22 MED ORDER — PHENAZOPYRIDINE HCL 100 MG PO TABS: 100.0000 mg | ORAL_TABLET | Freq: Three times a day (TID) | ORAL | Status: DC | PRN

## 2023-03-22 MED ORDER — PROPOFOL 10 MG/ML IV BOLUS
INTRAVENOUS | Status: DC | PRN
  Administered 2023-03-22: 140 mg via INTRAVENOUS

## 2023-03-22 MED ORDER — FENTANYL CITRATE (PF) 100 MCG/2ML IJ SOLN
INTRAMUSCULAR | Status: DC | PRN
  Administered 2023-03-22 (×2): 50 ug via INTRAVENOUS

## 2023-03-22 MED ORDER — EPHEDRINE SULFATE-NACL 50-0.9 MG/10ML-% IV SOSY
PREFILLED_SYRINGE | INTRAVENOUS | Status: DC | PRN
  Administered 2023-03-22 (×2): 5 mg via INTRAVENOUS

## 2023-03-22 MED ORDER — LIDOCAINE 2% (20 MG/ML) 5 ML SYRINGE
INTRAMUSCULAR | Status: DC | PRN
  Administered 2023-03-22: 80 mg via INTRAVENOUS

## 2023-03-22 MED ORDER — ZOLPIDEM TARTRATE 5 MG PO TABS
5.0000 mg | ORAL_TABLET | Freq: Every evening | ORAL | Status: AC | PRN
  Administered 2023-03-22 – 2023-03-24 (×3): 5 mg via ORAL
  Filled 2023-03-22 (×3): qty 1

## 2023-03-22 MED ORDER — OXYCODONE HCL 5 MG/5ML PO SOLN: 5.0000 mg | Freq: Once | ORAL | Status: DC | PRN

## 2023-03-22 MED ORDER — DEXAMETHASONE SODIUM PHOSPHATE 10 MG/ML IJ SOLN
INTRAMUSCULAR | Status: DC | PRN
  Administered 2023-03-22: 5 mg via INTRAVENOUS

## 2023-03-22 MED ORDER — AMIODARONE HCL 200 MG PO TABS: 200.0000 mg | ORAL_TABLET | Freq: Every day | ORAL | Status: DC

## 2023-03-22 MED ORDER — METOPROLOL TARTRATE 50 MG PO TABS: 50.0000 mg | ORAL_TABLET | Freq: Every day | ORAL | Status: DC

## 2023-03-22 MED ORDER — METOPROLOL TARTRATE 50 MG PO TABS
50.0000 mg | ORAL_TABLET | Freq: Every day | ORAL | Status: DC
  Administered 2023-03-23 – 2023-03-24 (×2): 50 mg via ORAL
  Filled 2023-03-22 (×2): qty 1

## 2023-03-22 MED ORDER — ONDANSETRON HCL 4 MG/2ML IJ SOLN: 4.0000 mg | Freq: Four times a day (QID) | INTRAMUSCULAR | Status: DC | PRN

## 2023-03-22 MED ORDER — ONDANSETRON HCL 4 MG PO TABS: 4.0000 mg | ORAL_TABLET | Freq: Four times a day (QID) | ORAL | Status: DC | PRN

## 2023-03-22 SURGICAL SUPPLY — 22 items
BAG URO CATCHER STRL LF (MISCELLANEOUS) ×1 IMPLANT
BASKET ZERO TIP NITINOL 2.4FR (BASKET) IMPLANT
BSKT STON RTRVL ZERO TP 2.4FR (BASKET) ×1
CATH URETL OPEN 5X70 (CATHETERS) IMPLANT
CLOTH BEACON ORANGE TIMEOUT ST (SAFETY) ×1 IMPLANT
FIBER LASER MOSES 200 DFL (Laser) IMPLANT
GLOVE SS BIOGEL STRL SZ 8 (GLOVE) ×1 IMPLANT
GOWN SPEC L4 XLG W/TWL (GOWN DISPOSABLE) ×1 IMPLANT
GUIDEWIRE STR DUAL SENSOR (WIRE) ×1 IMPLANT
GUIDEWIRE ZIPWRE .038 STRAIGHT (WIRE) IMPLANT
KIT TURNOVER KIT A (KITS) IMPLANT
LASER FIB FLEXIVA PULSE ID 365 (Laser) IMPLANT
MANIFOLD NEPTUNE II (INSTRUMENTS) ×1 IMPLANT
PACK CYSTO (CUSTOM PROCEDURE TRAY) ×1 IMPLANT
SHEATH NAVIGATOR HD 11/13X36 (SHEATH) IMPLANT
SHEATH NAVIGATOR HD 12/14X36 (SHEATH) IMPLANT
SHEATH URETERAL FLEX 12X35 (SHEATH) IMPLANT
STENT URET 6FRX28 CONTOUR (STENTS) IMPLANT
TRACTIP FLEXIVA PULS ID 200XHI (Laser) IMPLANT
TRACTIP FLEXIVA PULSE ID 200 (Laser)
TUBING CONNECTING 10 (TUBING) ×1 IMPLANT
TUBING UROLOGY SET (TUBING) ×1 IMPLANT

## 2023-03-22 NOTE — ED Triage Notes (Signed)
Pt reports with left flank pain since last night. Pt said that he thinks he has a kidney stone.

## 2023-03-22 NOTE — Anesthesia Procedure Notes (Signed)
Procedure Name: LMA Insertion Date/Time: 03/22/2023 1:36 PM  Performed by: Elyn Peers, CRNAPre-anesthesia Checklist: Patient identified, Emergency Drugs available, Suction available, Patient being monitored and Timeout performed Patient Re-evaluated:Patient Re-evaluated prior to induction Oxygen Delivery Method: Circle system utilized Preoxygenation: Pre-oxygenation with 100% oxygen Induction Type: IV induction Ventilation: Mask ventilation without difficulty LMA: LMA inserted LMA Size: 5.0 Number of attempts: 1 Placement Confirmation: positive ETCO2 and breath sounds checked- equal and bilateral Tube secured with: Tape Dental Injury: Teeth and Oropharynx as per pre-operative assessment

## 2023-03-22 NOTE — Op Note (Signed)
OPERATIVE NOTE   Patient Name: Kevin Doyle  MRN: [id number]   Date of Procedure: 03/22/23   Preoperative diagnosis:  Bilateral ureteral calculi (right: distal, left: proximal) Left flank pain AKI  Postoperative diagnosis:  Bilateral ureteral calculi (right: distal, left: proximal) Left flank pain AKI  Procedure:  Cystoscopy Bilateral retrograde pyelograms with intra-operative interpretation Right ureteroscopic laser lithotripsy (stone obtained) Insertion of bilateral ureteral stents (38F x 28 cm, right w/ tether, left no tether)  Attending: Milderd Meager, MD  Anesthesia: General  Estimated blood loss: 5 ml  Fluids: Per anesthesia record  Drains: Bilateral 38F x 28 cm ureteral stents (right w/ tether, left no tether)  Specimens: Stone fragments given to patient  Antibiotics: Ancef 2 gm IV  Findings: Normal urethra and bladder; mild lateral lobe enlargement of the prostate; 5 x 9 mm distal right ureteral calculus; 10 mm proximal left ureteral calculus with obstruction  Indications:  72 year old male presented to the emergency room earlier today with worsening left-sided flank pain.  He has had intermittent flank pain for some time.  No prior history of kidney stones.  CT imaging showed a 10 mm calculus in the proximal left ureter with associated obstruction and a 9 mm calculus in the right distal ureter without obstructive changes.  His creatinine was slightly increased to 1.9 from recent level of 1.32 1 week ago.  No fevers or chills.  Urinalysis showed 0-5 RBCs, 0-5 WBCs, and rare bacteria. He presents now for surgical management of his bilateral ureteral calculi with cystoscopy, bilateral retrograde pyelograms, right ureteroscopy with possible laser lithotripsy and insertion of bilateral ureteral stents.  I did not recommend left ureteroscopy at this time given the proximal location of the stone and increased risk of bleeding as he is currently anticoagulated on  Eliquis for recently diagnosed atrial fibrillation.  Risk and benefits of the procedure discussed with the patient in detail.  He understands and wishes to proceed as described.  Description of Procedure:  The patient received IV Ancef preoperatively.  The patient was brought to the operating room suite and properly identified.  After successful induction of a general anesthetic, the patient was placed in the dorsolithotomy position.  All pressure points were appropriately padded.  The patient's genital area was prepped and draped in sterile fashion.  Under direct visualization, a 21 French rigid cystoscope was passed through the urethra and into the bladder.  The patient was noted to have some mild enlargement of the lateral lobes of the prostate.  Inspection of the bladder demonstrated no mucosal lesions.  A normal-appearing trigone was seen with a single orifice bilaterally.  Bilateral retrograde pyelograms were performed for evaluation of the patient's upper tracts given the findings of bilateral ureteral calculi.  Scout film showed a nonspecific bowel gas pattern and no obvious bony abnormalities.  A 5 x 9 calcification was seen in the right pelvis consistent with the right distal ureteral calculus and a 9 x 10 mm calcification was seen in the left upper abdomen consistent with the proximal left ureteral calculus.  Using a 5 [place] open-ended catheter, contrast was injected into the right ureter.  The calcification was confirmed to be within the distal right ureter.  There was no significant dilation of the ureter proximal to this.  A normal right collecting system was identified without additional filling defects.  In a similar fashion, contrast was injected into the left ureter.  The left distal and mid ureter were normal.  The previously noted calcification  was confirmed to be within the proximal left ureter near the UPJ.  There was dilation proximal to this including the renal pelvis and  calyces.  The 5 [place] open ended catheter was passed into the proximal left ureter.  With gentle manipulation, the stone was pushed proximally into the left renal pelvis.  Following relief of the obstruction, a hydronephrotic drip was noted with brown-colored urine.  The sensor guidewire was passed through the open-ended catheter and coiled in the left renal pelvis.  A 6 French by 28 cm double-J stent was then passed over the guidewire and into the left renal pelvis under fluoroscopic guidance.  The tether was removed prior to stent placement.  Excellent stent position was noted with efflux of urine seen through the stent.  Attention was then turned to the right ureter.  A sensor guidewire was passed through the cystoscope and into the right distal ureter beyond the stone and into the right renal pelvis under fluoroscopic guidance.  The distal ureter was gently dilated with an 11 [place] access sheath.  A dual-lumen semirigid ureteroscope was then passed alongside the guidewire and into the distal ureter.  The stone was visualized in the distal ureter.  Using the holmium laser fiber, the stone was fragmented into multiple small pieces.  The larger fragments were removed with a 0 tip nitinol basket and placed into the bladder.  Inspection of the ureter demonstrated no ureteral injury.  No large remaining fragments were seen.  A 6 French by 28 cm ureteral stent was then placed over the guidewire and into the right renal pelvis.  The tether was left attached and brought through the urethra.  The stone fragments were irrigated out of the bladder and obtained for stone analysis.  Inspection with the cystoscope demonstrated excellent stent position.  The bladder was then drained and the cystoscope was removed.  Intraurethral lidocaine jelly was placed at the end of the procedure.  The patient was then extubated and taken to the post anesthesia care unit in stable condition.  Complications: None  Condition: Stable,  extubated, transferred to PACU  Plan:  Continue right ureteral stent for approximately 4-5 days. Continue left ureteral stent until definitive treatment of the left ureteral calculus is performed. He may be candidate for shockwave lithotripsy if he is able to hold the Eliquis prior to the procedure. Follow renal function. Oral antibiotics x 5 days

## 2023-03-22 NOTE — ED Provider Notes (Signed)
[place] [facility]PARTMENT AT [facility] Provider Note   CSN: [id number] Arrival date & time: 03/22/23  0515    History  Chief Complaint  Patient presents with   Flank Pain    Kevin Doyle is a 72 y.o. male history of A-fib on chronic anticoagulation, Gilbert's disease, renal calculi here for evaluation of left flank pain.  Pain intermittent over the last few days.  Worse with movement, laying down.  He was unable to get comfortable at night.  Taking Tylenol which has not helped.  No ibuprofen due to anticoagulation use.  States he was told previously he had stones "up in the kidney" however he has not actually passed a kidney stone previously.  No recent injury or trauma.  No fever, nausea, vomiting, chest pain, shortness of breath, abdominal pain, dysuria or hematuria.  No rashes surrounding area.  No midline back pain, numbness, weakness to extremities.  No pain or swelling to lower legs.   HPI     Home Medications Prior to Admission medications   Medication Sig Start Date End Date Taking? Authorizing Provider  amiodarone (PACERONE) 200 MG tablet Take 1 tablet (200 mg total) by mouth daily. 03/20/23   Carlos Levering, NP  apixaban (ELIQUIS) 5 MG TABS tablet Take 1 tablet (5 mg total) by mouth 2 (two) times daily. 03/12/23 04/11/23  Hughie Closs, MD  Ascorbic Acid (VITAMIN C) 100 MG tablet Take 500 mg by mouth daily.    [provider]  Coenzyme Q10 (COQ10) 100 MG CAPS Take 100 mg by mouth daily.     [provider]  ELDERBERRY PO Take 1 tablet by mouth daily. Gummies    [provider]  metoprolol tartrate (LOPRESSOR) 100 MG tablet Take 0.5 tablets (50 mg total) by mouth every morning AND 1 tablet (100 mg total) every evening. 03/20/23 06/18/23  Carlos Levering, NP  Multiple Vitamin (MULTIVITAMIN) tablet Take 1 tablet by mouth daily.    [provider]  pantoprazole (PROTONIX) 40 MG tablet Take 1 tablet (40 mg total) by mouth  2 (two) times daily. Take for 8 weeks to promote mucosal healing. 10/08/19   Cirigliano, Verlin Dike, DO      Allergies    Patient has no known allergies.    Review of Systems   Review of Systems  Constitutional: Negative.   HENT: Negative.    Respiratory: Negative.    Cardiovascular: Negative.   Gastrointestinal:  Positive for abdominal pain. Negative for abdominal distention, anal bleeding, blood in stool, constipation, diarrhea, nausea, rectal pain and vomiting.  Genitourinary:  Positive for difficulty urinating and flank pain. Negative for decreased urine volume, dysuria, enuresis, frequency, genital sores, hematuria, penile discharge, penile pain, penile swelling, scrotal swelling, testicular pain and urgency.  Skin: Negative.   Neurological: Negative.   All other systems reviewed and are negative.   Physical Exam Updated Vital Signs BP (!) 148/81 (BP Location: Right Arm)   Pulse 73   Temp 98.9 F (37.2 C)   Resp 18   Ht 6\' 2"  (1.88 m)   Wt 90.7 kg   SpO2 100%   BMI 25.68 kg/m  Physical Exam Vitals and nursing note reviewed.  Constitutional:      General: He is not in acute distress.    Appearance: He is well-developed. He is not ill-appearing, toxic-appearing or diaphoretic.  HENT:     Head: Normocephalic and atraumatic.     Nose: Nose normal.     Mouth/Throat:  Mouth: Mucous membranes are moist.  Eyes:     Pupils: Pupils are equal, round, and reactive to light.  Cardiovascular:     Rate and Rhythm: Normal rate and regular rhythm.     Pulses: Normal pulses.          Radial pulses are 2+ on the right side and 2+ on the left side.       Dorsalis pedis pulses are 2+ on the right side and 2+ on the left side.     Heart sounds: Normal heart sounds.  Pulmonary:     Effort: Pulmonary effort is normal. No respiratory distress.     Breath sounds: Normal breath sounds and air entry.     Comments: Clear bilaterally, speaks in full sentences without difficulty Chest:      Comments: Nontender Abdominal:     General: Bowel sounds are normal. There is no distension.     Palpations: Abdomen is soft. There is no mass.     Tenderness: There is no abdominal tenderness. There is left CVA tenderness. There is no right CVA tenderness, guarding or rebound.     Hernia: No hernia is present.     Comments: Abdomen soft, nontender  Musculoskeletal:        General: Normal range of motion.     Cervical back: Normal range of motion and neck supple.     Comments: No midline C/T/L tenderness Tenderness overlying left flank, no overlying skin changes Compartments soft, moves bilateral lower extremities without difficulty  Skin:    General: Skin is warm and dry.     Comments: No vesicles, rashes or lesions  Neurological:     General: No focal deficit present.     Mental Status: He is alert and oriented to person, place, and time.     Cranial Nerves: Cranial nerves 2-12 are intact.     Sensory: Sensation is intact.     Motor: Motor function is intact.     Gait: Gait is intact.    ED Results / Procedures / Treatments   Labs (all labs ordered are listed, but only abnormal results are displayed) Labs Reviewed  COMPREHENSIVE METABOLIC PANEL - Abnormal; Notable for the following components:      Result Value   CO2 20 (*)    Glucose, Bld 123 (*)    BUN 29 (*)    Creatinine, Ser 1.90 (*)    Calcium 8.7 (*)    Albumin 3.2 (*)    Total Bilirubin 1.4 (*)    GFR, Estimated 37 (*)    All other components within normal limits  CBC WITH DIFFERENTIAL/PLATELET - Abnormal; Notable for the following components:   WBC 12.4 (*)    RBC 3.66 (*)    Hemoglobin 11.1 (*)    HCT 33.5 (*)    Platelets 420 (*)    Neutro Abs 10.8 (*)    Lymphs Abs 0.6 (*)    All other components within normal limits  URINALYSIS, ROUTINE W REFLEX MICROSCOPIC - Abnormal; Notable for the following components:   Hgb urine dipstick MODERATE (*)    Ketones, ur 5 (*)    Bacteria, UA RARE (*)    All other  components within normal limits  LIPASE, BLOOD  MAGNESIUM  PHOSPHORUS    EKG None  Radiology CT Renal Stone Study  Result Date: 03/22/2023 CLINICAL DATA:  Abdominal/flank pain. Stone suspected with left flank pain. EXAM: CT ABDOMEN AND PELVIS WITHOUT CONTRAST TECHNIQUE: Multidetector CT imaging of the  abdomen and pelvis was performed following the standard protocol without IV contrast. RADIATION DOSE REDUCTION: This exam was performed according to the departmental dose-optimization program which includes automated exposure control, adjustment of the mA and/or kV according to patient size and/or use of iterative reconstruction technique. COMPARISON:  CT 09/17/2019 FINDINGS: Lower chest: Small left pleural effusion. Compressive atelectasis in left lower lobe. Small dependent densities in the posterior right lower lobe. At least 2 small nodular densities near the right lung base largest measuring 4 mm on image 11/10. Small to moderate amount of pericardial fluid. Pericardial fluid is new since 2021 but was noted on recent transesophageal echocardiogram from 03/09/2023. Hepatobiliary: Cholecystectomy. Stable calcification near the liver capsule in the anterior liver on image 28/2. Mild dilatation of the extrahepatic biliary system appears similar to the previous examination. Pancreas: Unremarkable. No pancreatic ductal dilatation or surrounding inflammatory changes. Spleen: Normal in size without focal abnormality. Adrenals/Urinary Tract: Normal adrenal glands. Irregularity along the lateral aspect of the right kidney is likely associated with scarring. No definite right renal calculi. No right hydronephrosis. However, there is a 9 mm stone in the distal right ureter. Left perinephric stranding. Moderate to severe left hydronephrosis. Two stones within the left kidney largest measures 4 mm in the interpolar region. Large obstructing stone at the left ureteropelvic junction that measures up to 1.0 cm.  Stranding along the left retroperitoneal along the length of the left ureter. Normal appearance of the urinary bladder. Stomach/Bowel: Normal appearance of the stomach. Few dilated loops of small bowel in the anterior abdomen on image 53/2. Small bowel measures up to 4.6 cm. No evidence for focal bowel inflammation. Vascular/Lymphatic: Vascular structures are unremarkable. Again noted are prominent central mesenteric lymph nodes best seen on image 55/2. These prominent mesenteric lymph nodes appear to be chronic. Reproductive: Prostate is unremarkable. Other: Small amount of free fluid in the pelvis. Negative for free air. Small left inguinal hernia containing fat. Small umbilical hernia containing fat. Musculoskeletal: No acute bone abnormality. IMPRESSION: 1. Moderate to severe left hydronephrosis secondary to a 1 cm obstructing stone at the left ureteropelvic junction. Extensive left perinephric and retroperitoneal stranding associated with the urinary obstruction. Additional small left renal calculi. 2. Nonobstructive 9 mm stone in the distal right ureter. Chronic cortical scarring involving the right kidney. 3. Dilated loops of small bowel in the anterior abdomen. Findings are nonspecific. Focal ileus or partial bowel obstruction cannot be excluded. Small amount of free fluid in the pelvis. 4. Small left pleural effusion and pericardial effusion. Please note that the pericardial effusion was noted on the echocardiogram from 03/09/2023. 5. Prominent lymph nodes in the central aspect of the mesentery. These findings appear to be chronic. 6. Small nodular densities at the right lung base, largest measures 4 mm. No follow-up needed if patient is low-risk. Non-contrast chest CT can be considered in 12 months if patient is high-risk. This recommendation follows the consensus statement: Guidelines for Management of Incidental Pulmonary Nodules Detected on CT Images: From the Fleischner Society 2017; Radiology 2017;  284:228-243. Electronically Signed   By: Richarda Overlie M.D.   On: 03/22/2023 08:53    Procedures Procedures    Medications Ordered in ED Medications  0.45 % sodium chloride infusion (has no administration in time range)  acetaminophen (TYLENOL) tablet 650 mg (has no administration in time range)    Or  acetaminophen (TYLENOL) suppository 650 mg (has no administration in time range)  HYDROmorphone (DILAUDID) injection 0.5 mg (has no administration in time  range)  ondansetron (ZOFRAN) tablet 4 mg (has no administration in time range)    Or  ondansetron (ZOFRAN) injection 4 mg (has no administration in time range)  morphine (PF) 4 MG/ML injection 4 mg (4 mg Intravenous Given 03/22/23 0723)  ondansetron (ZOFRAN) injection 4 mg (4 mg Intravenous Given 03/22/23 0723)  sodium chloride 0.9 % bolus 500 mL (500 mLs Intravenous New Bag/Given 03/22/23 0726)    ED Course/ Medical Decision Making/ A&P   72 year old history of A-fib with RVR, chronic anticoagulation here for evaluation of left flank pain.  Has been having some intermittent pain however thought this was due to an old injury over the last week he has had severe pain, intermittent nature.  No urinary symptoms.  Having normal bowel movements.  No vomiting.  He was recently admitted for A-fib with RVR as well as fluid overload was given Lasix.  At that time developed an AKI.  Was not sent home on Lasix however saw cardiology 1 week ago and his creatinine had improved to 1.3, baseline is 1.8-1.9.  Last night left flank pain became so severe he was unable to sleep subsequently, to the emergency department.  He has no midline C/T/L tenderness.  No radicular symptoms to suggest lumbar etiology radiculopathy as cause of his pain.  No bloody stool, diarrhea, nausea or vomiting or abdominal pain and I have low suspicion for acute articulators, obstruction, perforation, infectious process.  He has not tenderness to his left flank however no obvious skin changes  to suggest zoster, cellulitis, abscess.  Will plan on labs, imaging and reassess.  Will give pain control as well.  Will give gentle fluid bolus given recent echocardiogram showed EF of 45% not appear grossly fluid overloaded at this time.  He denies any chest pain, shortness of breath, cough to suggest atypical intrathoracic etiologies such as PE, pneumonia, effusion, ACS, pneumothorax as cause of his flank pain.  Labs and imaging personally reviewed and interpreted:  CBC WBC 12.4, hemoglobin 11.1 CMP creatinine 1.9, most recent 1.3 however 2 weeks ago his baseline showed 0.81 Mag 2.1 Lipase 24 UA blood, no infection CT stone study shows 1 cm left UPJ stone with severe hydronephrosis, stranding and perinephric edema as well as 9 mm right ureter stone without hydro.  Also mentions possible ileus versus small bowel obstruction however patient eating and drinking without difficulty, no emesis having normal bowel movements, no abdominal distention at this time I have low suspicion for SBO or ileus.  I discussed results with patient.  His pain is currently well-controlled.  No vomiting.  He last took Eliquis yesterday evening.  Will touch base with urology  Discussed with Dr. Pete Glatter with urology.  Request n.p.o., will see in consult.  Will get medicine to admit  Discussed with Dr. Robb Matar with medicine who agrees to evaluate patient for admission  The patient appears reasonably stabilized for admission considering the current resources, flow, and capabilities available in the ED at this time, and I doubt any other [facility] requiring further screening and/or treatment in the ED prior to admission.                                   Medical Decision Making Amount and/or Complexity of Data Reviewed External Data Reviewed: labs, radiology and notes. Labs: ordered. Decision-making details documented in ED Course. Radiology: ordered and independent interpretation performed. Decision-making details  documented in ED Course.  Risk OTC drugs. Prescription drug management. Parenteral controlled substances. Decision regarding hospitalization. Diagnosis or treatment significantly limited by social determinants of health. Minor surgery with identified risk factors.          Final Clinical Impression(s) / ED Diagnoses Final diagnoses:  AKI (acute kidney injury) (HCC)  Obstruction of left ureteropelvic junction (UPJ) due to stone  Right ureteral stone  Chronic anticoagulation    Rx / DC Orders ED Discharge Orders     None         Makaylin Carlo A, PA-C 03/22/23 1049    Loetta Rough, MD 03/22/23 1412

## 2023-03-22 NOTE — ED Notes (Signed)
ED TO INPATIENT HANDOFF REPORT  Name/Age/Gender Kevin Doyle 72 y.o. male  Code Status    Code Status Orders  (From admission, onward)           Start     Ordered   03/22/23 1008  Full code  Continuous       Question:  By:  Answer:  Consent: discussion documented in EHR   03/22/23 1008           Code Status History     Date Active Date Inactive Code Status Order ID Comments User Context   03/07/2023 1957 03/12/2023 2014 Full Code [id number]  Nolberto Hanlon, MD Inpatient   09/01/2019 1850 09/03/2019 2234 Full Code [id number]  Anselm Jungling, DO ED   07/10/2012 1246 07/11/2012 1858 Full Code [id number]  Rama, Maryruth Bun, MD Inpatient   05/17/2012 1722 05/19/2012 1533 Full Code [id number]  Sherrie George, PA ED       Home/SNF/Other Home  Chief Complaint Hydronephrosis of left kidney [N13.30]  Level of Care/Admitting Diagnosis ED Disposition     ED Disposition  Admit   Condition  --   Comment  Hospital Area: [facility] [[id number]]  Level of Care: Telemetry [5]  Admit to tele based on following criteria: Complex arrhythmia (Bradycardia/Tachycardia)  May place patient in observation at [facility] or [facility] if equivalent level of care is available:: No  Covid Evaluation: Asymptomatic - no recent exposure (last 10 days) testing not required  Diagnosis: Hydronephrosis of left kidney [102725]  Admitting Physician: Bobette Mo [[id number]]  Attending Physician: Bobette Mo [[id number]]          Medical History Past Medical History:  Diagnosis Date   Atrial fibrillation (HCC)    last episode was 02/10/13   GERD (gastroesophageal reflux disease)    Gilbert's disease     Allergies No Known Allergies  IV Location/Drains/Wounds Patient Lines/Drains/Airways Status     Active Line/Drains/Airways     Name Placement date Placement time Site Days   Peripheral IV 03/22/23 20 G Anterior;Left;Upper Arm 03/22/23  0543  Arm  less than 1             Labs/Imaging Results for orders placed or performed during the hospital encounter of 03/22/23 (from the past 48 hour(s))  Comprehensive metabolic panel     Status: Abnormal   Collection Time: 03/22/23  5:23 AM  Result Value Ref Range   Sodium 136 135 - 145 mmol/L   Potassium 4.0 3.5 - 5.1 mmol/L   Chloride 104 98 - 111 mmol/L   CO2 20 (L) 22 - 32 mmol/L   Glucose, Bld 123 (H) 70 - 99 mg/dL    Comment: Glucose reference range applies only to samples taken after fasting for at least 8 hours.   BUN 29 (H) 8 - 23 mg/dL   Creatinine, Ser 6.38 (H) 0.61 - 1.24 mg/dL   Calcium 8.7 (L) 8.9 - 10.3 mg/dL   Total Protein 7.0 6.5 - 8.1 g/dL   Albumin 3.2 (L) 3.5 - 5.0 g/dL   AST 16 15 - 41 U/L   ALT 21 0 - 44 U/L   Alkaline Phosphatase 92 38 - 126 U/L   Total Bilirubin 1.4 (H) 0.3 - 1.2 mg/dL   GFR, Estimated 37 (L) >60 mL/min    Comment: (NOTE) Calculated using the CKD-EPI Creatinine Equation (2021)    Anion gap 12 5 - 15    Comment: Performed at [facility], [address], [place], [place] [address]  Lipase, blood     Status: None   Collection Time: 03/22/23  5:23 AM  Result Value Ref Range   Lipase 24 11 - 51 U/L    Comment: Performed at [facility], [address] [place], [place] [address]  CBC with Diff     Status: Abnormal   Collection Time: 03/22/23  5:23 AM  Result Value Ref Range   WBC 12.4 (H) 4.0 - 10.5 K/uL   RBC 3.66 (L) 4.22 - 5.81 MIL/uL   Hemoglobin 11.1 (L) 13.0 - 17.0 g/dL   HCT 24.4 (L) 01.0 - 27.2 %   MCV 91.5 80.0 - 100.0 fL   MCH 30.3 26.0 - 34.0 pg   MCHC 33.1 30.0 - 36.0 g/dL   RDW 53.6 64.4 - 03.4 %   Platelets 420 (H) 150 - 400 K/uL   nRBC 0.0 0.0 - 0.2 %   Neutrophils Relative % 87 %   Neutro Abs 10.8 (H) 1.7 - 7.7 K/uL   Lymphocytes Relative 5 %   Lymphs Abs 0.6 (L) 0.7 - 4.0 K/uL   Monocytes Relative 7 %   Monocytes Absolute 0.9 0.1 - 1.0 K/uL   Eosinophils Relative 0 %   Eosinophils  Absolute 0.0 0.0 - 0.5 K/uL   Basophils Relative 0 %   Basophils Absolute 0.0 0.0 - 0.1 K/uL   Immature Granulocytes 1 %   Abs Immature Granulocytes 0.07 0.00 - 0.07 K/uL    Comment: Performed at [facility], [address], [place], [place] [address]  Urinalysis, Routine w reflex microscopic -Urine, Clean Catch     Status: Abnormal   Collection Time: 03/22/23  5:23 AM  Result Value Ref Range   Color, Urine YELLOW YELLOW   APPearance CLEAR CLEAR   Specific Gravity, Urine 1.017 1.005 - 1.030   pH 5.0 5.0 - 8.0   Glucose, UA NEGATIVE NEGATIVE mg/dL   Hgb urine dipstick MODERATE (A) NEGATIVE   Bilirubin Urine NEGATIVE NEGATIVE   Ketones, ur 5 (A) NEGATIVE mg/dL   Protein, ur NEGATIVE NEGATIVE mg/dL   Nitrite NEGATIVE NEGATIVE   Leukocytes,Ua NEGATIVE NEGATIVE   RBC / HPF 0-5 0 - 5 RBC/hpf   WBC, UA 0-5 0 - 5 WBC/hpf   Bacteria, UA RARE (A) NONE SEEN   Squamous Epithelial / HPF 0-5 0 - 5 /HPF   Mucus PRESENT     Comment: Performed at [facility], [address] [place], [place] [address]   CT Renal Stone Study  Result Date: 03/22/2023 CLINICAL DATA:  Abdominal/flank pain. Stone suspected with left flank pain. EXAM: CT ABDOMEN AND PELVIS WITHOUT CONTRAST TECHNIQUE: Multidetector CT imaging of the abdomen and pelvis was performed following the standard protocol without IV contrast. RADIATION DOSE REDUCTION: This exam was performed according to the departmental dose-optimization program which includes automated exposure control, adjustment of the mA and/or kV according to patient size and/or use of iterative reconstruction technique. COMPARISON:  CT 09/17/2019 FINDINGS: Lower chest: Small left pleural effusion. Compressive atelectasis in left lower lobe. Small dependent densities in the posterior right lower lobe. At least 2 small nodular densities near the right lung base largest measuring 4 mm on image 11/10. Small to moderate amount of pericardial  fluid. Pericardial fluid is new since 2021 but was noted on recent transesophageal echocardiogram from 03/09/2023. Hepatobiliary: Cholecystectomy. Stable calcification near the liver capsule in the anterior liver on image 28/2. Mild dilatation  of the extrahepatic biliary system appears similar to the previous examination. Pancreas: Unremarkable. No pancreatic ductal dilatation or surrounding inflammatory changes. Spleen: Normal in size without focal abnormality. Adrenals/Urinary Tract: Normal adrenal glands. Irregularity along the lateral aspect of the right kidney is likely associated with scarring. No definite right renal calculi. No right hydronephrosis. However, there is a 9 mm stone in the distal right ureter. Left perinephric stranding. Moderate to severe left hydronephrosis. Two stones within the left kidney largest measures 4 mm in the interpolar region. Large obstructing stone at the left ureteropelvic junction that measures up to 1.0 cm. Stranding along the left retroperitoneal along the length of the left ureter. Normal appearance of the urinary bladder. Stomach/Bowel: Normal appearance of the stomach. Few dilated loops of small bowel in the anterior abdomen on image 53/2. Small bowel measures up to 4.6 cm. No evidence for focal bowel inflammation. Vascular/Lymphatic: Vascular structures are unremarkable. Again noted are prominent central mesenteric lymph nodes best seen on image 55/2. These prominent mesenteric lymph nodes appear to be chronic. Reproductive: Prostate is unremarkable. Other: Small amount of free fluid in the pelvis. Negative for free air. Small left inguinal hernia containing fat. Small umbilical hernia containing fat. Musculoskeletal: No acute bone abnormality. IMPRESSION: 1. Moderate to severe left hydronephrosis secondary to a 1 cm obstructing stone at the left ureteropelvic junction. Extensive left perinephric and retroperitoneal stranding associated with the urinary obstruction.  Additional small left renal calculi. 2. Nonobstructive 9 mm stone in the distal right ureter. Chronic cortical scarring involving the right kidney. 3. Dilated loops of small bowel in the anterior abdomen. Findings are nonspecific. Focal ileus or partial bowel obstruction cannot be excluded. Small amount of free fluid in the pelvis. 4. Small left pleural effusion and pericardial effusion. Please note that the pericardial effusion was noted on the echocardiogram from 03/09/2023. 5. Prominent lymph nodes in the central aspect of the mesentery. These findings appear to be chronic. 6. Small nodular densities at the right lung base, largest measures 4 mm. No follow-up needed if patient is low-risk. Non-contrast chest CT can be considered in 12 months if patient is high-risk. This recommendation follows the consensus statement: Guidelines for Management of Incidental Pulmonary Nodules Detected on CT Images: From the Fleischner Society 2017; Radiology 2017; 284:228-243. Electronically Signed   By: Richarda Overlie M.D.   On: 03/22/2023 08:53    Pending Labs Unresulted Labs (From admission, onward)     Start     Ordered   03/23/23 0500  CBC  Tomorrow morning,   R        03/22/23 1008   03/23/23 0500  Comprehensive metabolic panel  Tomorrow morning,   R        03/22/23 1008   03/22/23 1005  Magnesium  Add-on,   AD        03/22/23 1004   03/22/23 1005  Phosphorus  Add-on,   AD        03/22/23 1004            Vitals/Pain Today's Vitals   03/22/23 0700 03/22/23 0830 03/22/23 0935 03/22/23 1006  BP: (!) 155/83 133/70    Pulse: 66 70    Resp:  18    Temp:   98.6 F (37 C)   TempSrc:   Oral   SpO2: 96% 94%    Weight:      Height:      PainSc:    1     Isolation Precautions No active  isolations  Medications Medications  0.45 % sodium chloride infusion (has no administration in time range)  acetaminophen (TYLENOL) tablet 650 mg (has no administration in time range)    Or  acetaminophen (TYLENOL)  suppository 650 mg (has no administration in time range)  HYDROmorphone (DILAUDID) injection 0.5 mg (has no administration in time range)  ondansetron (ZOFRAN) tablet 4 mg (has no administration in time range)    Or  ondansetron (ZOFRAN) injection 4 mg (has no administration in time range)  morphine (PF) 4 MG/ML injection 4 mg (4 mg Intravenous Given 03/22/23 0723)  ondansetron (ZOFRAN) injection 4 mg (4 mg Intravenous Given 03/22/23 0723)  sodium chloride 0.9 % bolus 500 mL (500 mLs Intravenous New Bag/Given 03/22/23 0726)    Mobility walks

## 2023-03-22 NOTE — Interval H&P Note (Signed)
History and Physical Interval Note:  03/22/2023 1:20 PM  Kevin Doyle  has presented today for surgery, with the diagnosis of bilateral ureteral calculi, left hydronephrosis, AKI.  The various methods of treatment have been discussed with the patient and family. After consideration of risks, benefits and other options for treatment, the patient has consented to  Procedure(s): BILATERAL CYSTOSCOPY RETROGRADE /URETEROSCOPY/POSSIBLE HOLMIUM LASER/POSSIBLE STENT PLACEMENT (Bilateral) as a surgical intervention.  The patient's history has been reviewed, patient examined, no change in status, stable for surgery.  I have reviewed the patient's chart and labs.  Questions were answered to the patient's satisfaction.     Di Kindle

## 2023-03-22 NOTE — Consult Note (Addendum)
Urology Consult Note   Requesting Attending Physician:  Bobette Mo, MD Service Providing Consult: Urology  Consulting Attending:    Reason for Consult: Bilateral obstructing ureteral stones.  HPI: Kevin Doyle is seen in consultation for reasons noted above at the request of Bobette Mo, MD. patient presents to [facility] emergency department with complaint of left-sided flank pain times several days.  He does not have previous urologic history or incidence of kidney stones.  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  CT A/P revealed 1 cm left UPJ stone and a 5 x 9 distal right ureteral stone.  Serum creatinine was elevated 1.90.  Patient was alert, oriented, and resting comfortably on my examination.  He was accompanied by his wife.  All questions were answered to their satisfaction.  ------------------  Assessment:  72 y.o. male with bilateral ureteral stones.   Recommendations: # Ureteral stones CT A/P notes 1 cm obstructing left UPJ stone with 5 x 9 distal right ureteral stone.  This right stone does not appear to be obstructing at this time but will be addressed during the contralateral side. To the OR for cystoscopy with bilateral retrograde pyelogram and laser lithotripsy versus stent placement possible for either side depending on presentation. Trend labs, serum creatinine elevated to 1.90.  Agree with IV fluids We will continue to follow along  Case and plan discussed with Dr. Pete Glatter  Agree with above assessment and plan. Plan for cystoscopy, bilateral retrograde pyelograms, right ureteroscopy with possible laser lithotripsy, insertion of bilateral ureteral stents.  Risks, benefits, alternatives were discussed with the patient in detail.  Potential risks including, but not limited to, infection; bleeding;  injury to urethra, bladder, or ureter; possible need of other treatments; possible failure to remove the  calculus; ureteral  stricture formation; cardiac, pulmonary, cerebrovascular events; and anesthetic complications were discussed.  The patient understands and wishes to proceed.   Past Medical History: Past Medical History:  Diagnosis Date   Atrial fibrillation (HCC)    last episode was 02/10/13   GERD (gastroesophageal reflux disease)    Gilbert's disease     Past Surgical History:  Past Surgical History:  Procedure Laterality Date   CARDIOVERSION N/A 03/09/2023   Procedure: CARDIOVERSION;  Surgeon: Christell Constant, MD;  Location: [facility] INVASIVE CV LAB;  Service: Cardiovascular;  Laterality: N/A;   CARDIOVERSION N/A 03/12/2023   Procedure: CARDIOVERSION;  Surgeon: Jake Bathe, MD;  Location: [facility] INVASIVE CV LAB;  Service: Cardiovascular;  Laterality: N/A;   CHOLECYSTECTOMY  05/18/2012   Procedure: LAPAROSCOPIC CHOLECYSTECTOMY WITH INTRAOPERATIVE CHOLANGIOGRAM;  Surgeon: Valarie Merino, MD;  Location: [facility] ORS;  Service: General;  Laterality: N/A;   COLONOSCOPY  10/08/2019   2005, 2010   ERCP  07/11/2012   Procedure: ENDOSCOPIC RETROGRADE CHOLANGIOPANCREATOGRAPHY (ERCP);  Surgeon: Louis Meckel, MD;  Location: [facility];  Service: Endoscopy;  Laterality: N/A;   Hernia repairs     x 3    INGUINAL HERNIA REPAIR     bilateral   LAPAROSCOPIC LYSIS OF ADHESIONS  06/24/2012   Procedure: LAPAROSCOPIC LYSIS OF ADHESIONS;  Surgeon: Crecencio Mc, MD;  Location: [facility] ORS;  Service: Urology;;   ROBOTIC ASSITED PARTIAL NEPHRECTOMY  06/24/2012   Procedure: ROBOTIC ASSITED PARTIAL NEPHRECTOMY;  Surgeon: Crecencio Mc, MD;  Location: [facility] ORS;  Service: Urology;  Laterality: Right;   TEE WITHOUT CARDIOVERSION N/A 03/09/2023   Procedure: TRANSESOPHAGEAL ECHOCARDIOGRAM;  Surgeon: Christell Constant, MD;  Location: [facility] INVASIVE  CV LAB;  Service: Cardiovascular;  Laterality: N/A;   UPPER GASTROINTESTINAL ENDOSCOPY  10/08/2019   2005,2010,2013    Medication: Current Facility-Administered  Medications  Medication Dose Route Frequency Provider Last Rate Last Admin   0.45 % sodium chloride infusion   Intravenous Continuous Bobette Mo, MD       [facility] Hold] acetaminophen (TYLENOL) tablet 650 mg  650 mg Oral Q6H PRN Bobette Mo, MD       Or   [facility] Hold] acetaminophen (TYLENOL) suppository 650 mg  650 mg Rectal Q6H PRN Bobette Mo, MD       [facility] Hold] HYDROmorphone (DILAUDID) injection 0.5 mg  0.5 mg Intravenous Q2H PRN Bobette Mo, MD   0.5 mg at 03/22/23 1138   lactated ringers infusion   Intravenous Continuous Brule Nation, MD 10 mL/hr at 03/22/23 1249 New Bag at 03/22/23 1249   [MAR Hold] ondansetron (ZOFRAN) tablet 4 mg  4 mg Oral Q6H PRN Bobette Mo, MD       Or   Mitzi Hansen Hold] ondansetron [facility]) injection 4 mg  4 mg Intravenous Q6H PRN Bobette Mo, MD        Allergies: No Known Allergies  Social History: Social History   Tobacco Use   Smoking status: Former   Smokeless tobacco: Never   Tobacco comments:    Quit at age 6   Vaping Use   Vaping status: Never Used  Substance Use Topics   Alcohol use: Yes    Comment: Rare-Beer    Drug use: No    Family History Family History  Problem Relation Age of Onset   Lung cancer Mother    Atrial fibrillation Mother    Brain cancer Father    Atrial fibrillation Brother    Colon cancer Neg Hx    Rectal cancer Neg Hx    Stomach cancer Neg Hx    Esophageal cancer Neg Hx     Review of Systems  Genitourinary:  Positive for flank pain.     Objective   Vital signs in last 24 hours: BP (!) 156/85   Pulse 90   Temp 98.5 F (36.9 C) (Oral)   Resp 16   Ht 6\' 2"  (1.88 m)   Wt 90.7 kg   SpO2 97%   BMI 25.68 kg/m   Physical Exam General: NAD, A&O, resting, appropriate HEENT: [place]/AT Pulmonary: Normal work of breathing Cardiovascular: RRR, no cyanosis   Most Recent Labs: Lab Results  Component Value Date   WBC 12.4 (H) 03/22/2023   HGB 11.1 (L) 03/22/2023   HCT  33.5 (L) 03/22/2023   PLT 420 (H) 03/22/2023    Lab Results  Component Value Date   NA 136 03/22/2023   K 4.0 03/22/2023   CL 104 03/22/2023   CO2 20 (L) 03/22/2023   BUN 29 (H) 03/22/2023   CREATININE 1.90 (H) 03/22/2023   CALCIUM 8.7 (L) 03/22/2023   MG 2.1 03/22/2023   PHOS 3.0 03/22/2023    Lab Results  Component Value Date   INR 1.5 (H) 03/08/2023   APTT 38 (H) 03/08/2023     Urine Culture: @[id number] (laburin,org,[id number])@   IMAGING: CT Renal Stone Study  Result Date: 03/22/2023 CLINICAL DATA:  Abdominal/flank pain. Stone suspected with left flank pain. EXAM: CT ABDOMEN AND PELVIS WITHOUT CONTRAST TECHNIQUE: Multidetector CT imaging of the abdomen and pelvis was performed following the standard protocol without IV contrast. RADIATION DOSE REDUCTION: This exam was performed according to the departmental  dose-optimization program which includes automated exposure control, adjustment of the mA and/or kV according to patient size and/or use of iterative reconstruction technique. COMPARISON:  CT 09/17/2019 FINDINGS: Lower chest: Small left pleural effusion. Compressive atelectasis in left lower lobe. Small dependent densities in the posterior right lower lobe. At least 2 small nodular densities near the right lung base largest measuring 4 mm on image 11/10. Small to moderate amount of pericardial fluid. Pericardial fluid is new since 2021 but was noted on recent transesophageal echocardiogram from 03/09/2023. Hepatobiliary: Cholecystectomy. Stable calcification near the liver capsule in the anterior liver on image 28/2. Mild dilatation of the extrahepatic biliary system appears similar to the previous examination. Pancreas: Unremarkable. No pancreatic ductal dilatation or surrounding inflammatory changes. Spleen: Normal in size without focal abnormality. Adrenals/Urinary Tract: Normal adrenal glands. Irregularity along the lateral aspect of the right kidney is likely associated with  scarring. No definite right renal calculi. No right hydronephrosis. However, there is a 9 mm stone in the distal right ureter. Left perinephric stranding. Moderate to severe left hydronephrosis. Two stones within the left kidney largest measures 4 mm in the interpolar region. Large obstructing stone at the left ureteropelvic junction that measures up to 1.0 cm. Stranding along the left retroperitoneal along the length of the left ureter. Normal appearance of the urinary bladder. Stomach/Bowel: Normal appearance of the stomach. Few dilated loops of small bowel in the anterior abdomen on image 53/2. Small bowel measures up to 4.6 cm. No evidence for focal bowel inflammation. Vascular/Lymphatic: Vascular structures are unremarkable. Again noted are prominent central mesenteric lymph nodes best seen on image 55/2. These prominent mesenteric lymph nodes appear to be chronic. Reproductive: Prostate is unremarkable. Other: Small amount of free fluid in the pelvis. Negative for free air. Small left inguinal hernia containing fat. Small umbilical hernia containing fat. Musculoskeletal: No acute bone abnormality. IMPRESSION: 1. Moderate to severe left hydronephrosis secondary to a 1 cm obstructing stone at the left ureteropelvic junction. Extensive left perinephric and retroperitoneal stranding associated with the urinary obstruction. Additional small left renal calculi. 2. Nonobstructive 9 mm stone in the distal right ureter. Chronic cortical scarring involving the right kidney. 3. Dilated loops of small bowel in the anterior abdomen. Findings are nonspecific. Focal ileus or partial bowel obstruction cannot be excluded. Small amount of free fluid in the pelvis. 4. Small left pleural effusion and pericardial effusion. Please note that the pericardial effusion was noted on the echocardiogram from 03/09/2023. 5. Prominent lymph nodes in the central aspect of the mesentery. These findings appear to be chronic. 6. Small nodular  densities at the right lung base, largest measures 4 mm. No follow-up needed if patient is low-risk. Non-contrast chest CT can be considered in 12 months if patient is high-risk. This recommendation follows the consensus statement: Guidelines for Management of Incidental Pulmonary Nodules Detected on CT Images: From the Fleischner Society 2017; Radiology 2017; 284:228-243. Electronically Signed   By: Richarda Overlie M.D.   On: 03/22/2023 08:53    ------  Elmon Kirschner, NP Pager: [phone]   Please contact the urology consult pager with any further questions/concerns.

## 2023-03-22 NOTE — Plan of Care (Signed)
  Problem: Education: Goal: Knowledge of General Education information will improve Description: Including pain rating scale, medication(s)/side effects and non-pharmacologic comfort measures Outcome: Completed/Met   Problem: Activity: Goal: Risk for activity intolerance will decrease Outcome: Completed/Met   

## 2023-03-22 NOTE — ED Notes (Signed)
Provided labeled urine cup

## 2023-03-22 NOTE — Anesthesia Preprocedure Evaluation (Addendum)
Anesthesia Evaluation  Patient identified by MRN, date of birth, ID band Patient awake    Reviewed: Allergy & Precautions, NPO status , Patient's Chart, lab work & pertinent test results, reviewed documented beta blocker date and time   Airway Mallampati: I  TM Distance: >3 FB     Dental no notable dental hx. (+) Dental Advisory Given   Pulmonary pneumonia, resolved, former smoker   Pulmonary exam normal breath sounds clear to auscultation       Cardiovascular Normal cardiovascular exam+ dysrhythmias Atrial Fibrillation  Rhythm:Regular Rate:Normal     Neuro/Psych negative neurological ROS  negative psych ROS   GI/Hepatic ,GERD  Medicated,,Gilbert's syndrome   Endo/Other  negative endocrine ROS    Renal/GU Renal diseaseBilateral ureteral calculi Left hydronephrosis  negative genitourinary   Musculoskeletal negative musculoskeletal ROS (+)    Abdominal   Peds  Hematology  (+) Blood dyscrasia, anemia Eliquis therapy- last dose 9/4   Anesthesia Other Findings   Reproductive/Obstetrics                              Anesthesia Physical Anesthesia Plan  ASA: 2  Anesthesia Plan:    Post-op Pain Management: Minimal or no pain anticipated and Dilaudid IV   Induction: Intravenous  PONV Risk Score and Plan: 2  Airway Management Planned: LMA  Additional Equipment: None  Intra-op Plan:   Post-operative Plan: Extubation in OR  Informed Consent: I have reviewed the patients History and Physical, chart, labs and discussed the procedure including the risks, benefits and alternatives for the proposed anesthesia with the patient or authorized representative who has indicated his/her understanding and acceptance.     Dental advisory given  Plan Discussed with: Anesthesiologist and CRNA  Anesthesia Plan Comments:          Anesthesia Quick Evaluation

## 2023-03-22 NOTE — Transfer of Care (Signed)
Immediate Anesthesia Transfer of Care Note  Patient: Kevin Doyle  Procedure(s) Performed: BILATERAL CYSTOSCOPY RETROGRADE, RIGHT URETEROSCOPY WITH HOLMIUM LASER, BILATERAL STENT PLACEMENT (Bilateral: Bladder)  Patient Location: PACU  Anesthesia Type:General  Level of Consciousness: awake, alert , oriented, and patient cooperative  Airway & Oxygen Therapy: Patient Spontanous Breathing and Patient connected to face mask oxygen  Post-op Assessment: Report given to RN and Post -op Vital signs reviewed and stable  Post vital signs: Reviewed and stable  Last Vitals:  Vitals Value Taken Time  BP 133/68 03/22/23 1451  Temp    Pulse 78 03/22/23 1452  Resp 15 03/22/23 1452  SpO2 100 % 03/22/23 1452  Vitals shown include unfiled device data.  Last Pain:  Vitals:   03/22/23 1244  TempSrc: Oral  PainSc: 3       Patients Stated Pain Goal: 4 (03/22/23 1244)  Complications: No notable events documented.

## 2023-03-22 NOTE — H&P (View-Only) (Signed)
Urology Consult Note   Requesting Attending Physician:  Bobette Mo, MD Service Providing Consult: Urology  Consulting Attending:    Reason for Consult: Bilateral obstructing ureteral stones.  HPI: Kevin Doyle is seen in consultation for reasons noted above at the request of Bobette Mo, MD. patient presents to [facility] emergency department with complaint of left-sided flank pain times several days.  He does not have previous urologic history or incidence of kidney stones.  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  CT A/P revealed 1 cm left UPJ stone and a 5 x 9 distal right ureteral stone.  Serum creatinine was elevated 1.90.  Patient was alert, oriented, and resting comfortably on my examination.  He was accompanied by his wife.  All questions were answered to their satisfaction.  ------------------  Assessment:  72 y.o. male with bilateral ureteral stones.   Recommendations: # Ureteral stones CT A/P notes 1 cm obstructing left UPJ stone with 5 x 9 distal right ureteral stone.  This right stone does not appear to be obstructing at this time but will be addressed during the contralateral side. To the OR for cystoscopy with bilateral retrograde pyelogram and laser lithotripsy versus stent placement possible for either side depending on presentation. Trend labs, serum creatinine elevated to 1.90.  Agree with IV fluids We will continue to follow along  Case and plan discussed with Dr. Pete Glatter  Agree with above assessment and plan. Plan for cystoscopy, bilateral retrograde pyelograms, right ureteroscopy with possible laser lithotripsy, insertion of bilateral ureteral stents.  Risks, benefits, alternatives were discussed with the patient in detail.  Potential risks including, but not limited to, infection; bleeding;  injury to urethra, bladder, or ureter; possible need of other treatments; possible failure to remove the  calculus; ureteral  stricture formation; cardiac, pulmonary, cerebrovascular events; and anesthetic complications were discussed.  The patient understands and wishes to proceed.   Past Medical History: Past Medical History:  Diagnosis Date   Atrial fibrillation (HCC)    last episode was 02/10/13   GERD (gastroesophageal reflux disease)    Gilbert's disease     Past Surgical History:  Past Surgical History:  Procedure Laterality Date   CARDIOVERSION N/A 03/09/2023   Procedure: CARDIOVERSION;  Surgeon: Christell Constant, MD;  Location: [facility] CV [facility];  Service: Cardiovascular;  Laterality: N/A;   CARDIOVERSION N/A 03/12/2023   Procedure: CARDIOVERSION;  Surgeon: Jake Bathe, MD;  Location: [facility] CV [facility];  Service: Cardiovascular;  Laterality: N/A;   CHOLECYSTECTOMY  05/18/2012   Procedure: LAPAROSCOPIC CHOLECYSTECTOMY WITH INTRAOPERATIVE CHOLANGIOGRAM;  Surgeon: Valarie Merino, MD;  Location: [facility];  Service: General;  Laterality: N/A;   COLONOSCOPY  10/08/2019   2005, 2010   ERCP  07/11/2012   Procedure: ENDOSCOPIC RETROGRADE CHOLANGIOPANCREATOGRAPHY (ERCP);  Surgeon: Louis Meckel, MD;  Location: [facility];  Service: Endoscopy;  Laterality: N/A;   Hernia repairs     x 3    INGUINAL HERNIA REPAIR     bilateral   LAPAROSCOPIC LYSIS OF ADHESIONS  06/24/2012   Procedure: LAPAROSCOPIC LYSIS OF ADHESIONS;  Surgeon: Crecencio Mc, MD;  Location: [facility];  Service: Urology;;   ROBOTIC ASSITED PARTIAL NEPHRECTOMY  06/24/2012   Procedure: ROBOTIC ASSITED PARTIAL NEPHRECTOMY;  Surgeon: Crecencio Mc, MD;  Location: [facility];  Service: Urology;  Laterality: Right;   TEE WITHOUT CARDIOVERSION N/A 03/09/2023   Procedure: TRANSESOPHAGEAL ECHOCARDIOGRAM;  Surgeon: Christell Constant, MD;  Location: [facility] INVASIVE  CV LAB;  Service: Cardiovascular;  Laterality: N/A;   UPPER GASTROINTESTINAL ENDOSCOPY  10/08/2019   2005,2010,2013    Medication: Current Facility-Administered  Medications  Medication Dose Route Frequency Provider Last Rate Last Admin   0.45 % sodium chloride infusion   Intravenous Continuous Bobette Mo, MD       [facility] Hold] acetaminophen (TYLENOL) tablet 650 mg  650 mg Oral Q6H PRN Bobette Mo, MD       Or   [facility] Hold] acetaminophen (TYLENOL) suppository 650 mg  650 mg Rectal Q6H PRN Bobette Mo, MD       [facility] Hold] HYDROmorphone (DILAUDID) injection 0.5 mg  0.5 mg Intravenous Q2H PRN Bobette Mo, MD   0.5 mg at 03/22/23 1138   lactated ringers infusion   Intravenous Continuous Brule Nation, MD 10 mL/hr at 03/22/23 1249 New Bag at 03/22/23 1249   [MAR Hold] ondansetron (ZOFRAN) tablet 4 mg  4 mg Oral Q6H PRN Bobette Mo, MD       Or   [facility]itzi Hansen Hold] ondansetron [facility]) injection 4 mg  4 mg Intravenous Q6H PRN Bobette Mo, MD        Allergies: No Known Allergies  Social History: Social History   Tobacco Use   Smoking status: Former   Smokeless tobacco: Never   Tobacco comments:    Quit at age 6   Vaping Use   Vaping status: Never Used  Substance Use Topics   Alcohol use: Yes    Comment: Rare-Beer    Drug use: No    Family History Family History  Problem Relation Age of Onset   Lung cancer Mother    Atrial fibrillation Mother    Brain cancer Father    Atrial fibrillation Brother    Colon cancer Neg Hx    Rectal cancer Neg Hx    Stomach cancer Neg Hx    Esophageal cancer Neg Hx     Review of Systems  Genitourinary:  Positive for flank pain.     Objective   Vital signs in last 24 hours: BP (!) 156/85   Pulse 90   Temp 98.5 F (36.9 C) (Oral)   Resp 16   Ht 6\' 2"  (1.88 m)   Wt 90.7 kg   SpO2 97%   BMI 25.68 kg/m   Physical Exam General: NAD, A&O, resting, appropriate HEENT: [place]/AT Pulmonary: Normal work of breathing Cardiovascular: RRR, no cyanosis   Most Recent Labs: Lab Results  Component Value Date   WBC 12.4 (H) 03/22/2023   HGB 11.1 (L) 03/22/2023   HCT  33.5 (L) 03/22/2023   PLT 420 (H) 03/22/2023    Lab Results  Component Value Date   NA 136 03/22/2023   K 4.0 03/22/2023   CL 104 03/22/2023   CO2 20 (L) 03/22/2023   BUN 29 (H) 03/22/2023   CREATININE 1.90 (H) 03/22/2023   CALCIUM 8.7 (L) 03/22/2023   MG 2.1 03/22/2023   PHOS 3.0 03/22/2023    Lab Results  Component Value Date   INR 1.5 (H) 03/08/2023   APTT 38 (H) 03/08/2023     Urine Culture: @[id number] (laburin,org,[id number])@   IMAGING: CT Renal Stone Study  Result Date: 03/22/2023 CLINICAL DATA:  Abdominal/flank pain. Stone suspected with left flank pain. EXAM: CT ABDOMEN AND PELVIS WITHOUT CONTRAST TECHNIQUE: Multidetector CT imaging of the abdomen and pelvis was performed following the standard protocol without IV contrast. RADIATION DOSE REDUCTION: This exam was performed according to the departmental  dose-optimization program which includes automated exposure control, adjustment of the mA and/or kV according to patient size and/or use of iterative reconstruction technique. COMPARISON:  CT 09/17/2019 FINDINGS: Lower chest: Small left pleural effusion. Compressive atelectasis in left lower lobe. Small dependent densities in the posterior right lower lobe. At least 2 small nodular densities near the right lung base largest measuring 4 mm on image 11/10. Small to moderate amount of pericardial fluid. Pericardial fluid is new since 2021 but was noted on recent transesophageal echocardiogram from 03/09/2023. Hepatobiliary: Cholecystectomy. Stable calcification near the liver capsule in the anterior liver on image 28/2. Mild dilatation of the extrahepatic biliary system appears similar to the previous examination. Pancreas: Unremarkable. No pancreatic ductal dilatation or surrounding inflammatory changes. Spleen: Normal in size without focal abnormality. Adrenals/Urinary Tract: Normal adrenal glands. Irregularity along the lateral aspect of the right kidney is likely associated with  scarring. No definite right renal calculi. No right hydronephrosis. However, there is a 9 mm stone in the distal right ureter. Left perinephric stranding. Moderate to severe left hydronephrosis. Two stones within the left kidney largest measures 4 mm in the interpolar region. Large obstructing stone at the left ureteropelvic junction that measures up to 1.0 cm. Stranding along the left retroperitoneal along the length of the left ureter. Normal appearance of the urinary bladder. Stomach/Bowel: Normal appearance of the stomach. Few dilated loops of small bowel in the anterior abdomen on image 53/2. Small bowel measures up to 4.6 cm. No evidence for focal bowel inflammation. Vascular/Lymphatic: Vascular structures are unremarkable. Again noted are prominent central mesenteric lymph nodes best seen on image 55/2. These prominent mesenteric lymph nodes appear to be chronic. Reproductive: Prostate is unremarkable. Other: Small amount of free fluid in the pelvis. Negative for free air. Small left inguinal hernia containing fat. Small umbilical hernia containing fat. Musculoskeletal: No acute bone abnormality. IMPRESSION: 1. Moderate to severe left hydronephrosis secondary to a 1 cm obstructing stone at the left ureteropelvic junction. Extensive left perinephric and retroperitoneal stranding associated with the urinary obstruction. Additional small left renal calculi. 2. Nonobstructive 9 mm stone in the distal right ureter. Chronic cortical scarring involving the right kidney. 3. Dilated loops of small bowel in the anterior abdomen. Findings are nonspecific. Focal ileus or partial bowel obstruction cannot be excluded. Small amount of free fluid in the pelvis. 4. Small left pleural effusion and pericardial effusion. Please note that the pericardial effusion was noted on the echocardiogram from 03/09/2023. 5. Prominent lymph nodes in the central aspect of the mesentery. These findings appear to be chronic. 6. Small nodular  densities at the right lung base, largest measures 4 mm. No follow-up needed if patient is low-risk. Non-contrast chest CT can be considered in 12 months if patient is high-risk. This recommendation follows the consensus statement: Guidelines for Management of Incidental Pulmonary Nodules Detected on CT Images: From the Fleischner Society 2017; Radiology 2017; 284:228-243. Electronically Signed   By: Richarda Overlie M.D.   On: 03/22/2023 08:53    ------  Elmon Kirschner, NP Pager: [phone]   Please contact the urology consult pager with any further questions/concerns.

## 2023-03-22 NOTE — H&P (Signed)
History and Physical    Patient: Kevin Doyle QQV:[id number] DOB: [birth year] DOA: 03/22/2023 DOS: the patient was seen and examined on 03/22/2023 PCP: Patient, No Pcp Per  Patient coming from: Home  Chief Complaint:  Chief Complaint  Patient presents with   Flank Pain   HPI: Kevin Doyle is a 72 y.o. male with medical history significant of Gilbert's disease, GERD, right renal mass, SBO, BPH, history of CAP, atrial fibrillation, urolithiasis who presented with left flank pain since last night that felt like a renal colic.  He took some acetaminophen without significant relief. No dysuria, frequency or hematuria.  No fever, chills or night sweats. No sore throat, rhinorrhea, dyspnea, wheezing or hemoptysis.  No chest pain, palpitations, diaphoresis, PND, orthopnea or pitting edema of the lower extremities.  No appetite changes, abdominal pain, diarrhea, constipation, melena or hematochezia. No polyuria, polydipsia, polyphagia or blurred vision.  He was sitting in PACU after undergoing retrograde cystoscopy with bilateral ureter urolith removal and bilateral stent placement.  Lab work: His urinalysis showed moderate hemoglobin, ketones of 5 and rare bacteria.  CBC with a white count of 12.4, hemoglobin 11.1 g/dL platelets 332.  Lipase was normal.  CMP showed a CO2 of 20 mmol/L with a normal anion gap.  The rest of the electrolytes were normal after calcium correction.  Glucose on 123, BUN 29 and creatinine 1.90 mg/dL.  His previous creatinine 6 days ago was 1.32 mg/dL and 2 weeks ago was normal.  Albumin was 3.2 g/deciliter and total bilirubin 1.4 mg/dL.  The rest of the LFTs were unremarkable.  Imaging: CT renal stone study showing moderate to severe left hydronephrosis secondary to a 1 cm obstructing stone at the left UPJ.  Extensive left perinephric and retroperitoneal stranding associated with the urinary obstruction.  Additional small left renal calculi.  Nonobstructive 9 mm stone in the  distal right ureter.  Chronic cortical scarring involving the right kidney.  Dilated loops of small bowel in the anterior abdomen.  Focal ileus or partial SBO cannot be excluded.  Small amount of free fluid in the pelvis.  Small left pleural effusion and pericardial effusion.  Prominent lymph nodes in the mesentery which appear to be chronic.  Small nodular densities at the right lung base, largest measured 4 mm.  No follow-up needed for patient is low risk.  Noncontrast chest CT in 12 months if the patient is high risk.   ED course: Initial vital signs were temperature 98.6 F, pulse 73, respiration 18, BP 132/96 mmHg and O2 sat 97% on room air.  The patient received normal saline 500 mL bolus, ondansetron 4 mg IVP and morphine 4 mg IVP.  Review of Systems: As mentioned in the history of present illness. All other systems reviewed and are negative. Past Medical History:  Diagnosis Date   Atrial fibrillation (HCC)    last episode was 02/10/13   GERD (gastroesophageal reflux disease)    Gilbert's disease    Past Surgical History:  Procedure Laterality Date   CARDIOVERSION N/A 03/09/2023   Procedure: CARDIOVERSION;  Surgeon: Christell Constant, MD;  Location: [facility] INV[facility] CV [facility];  Service: Cardiovascular;  Laterality: N/A;   CARDIOVERSION N/A 03/12/2023   Procedure: CARDIOVERSION;  Surgeon: Jake Bathe, MD;  Location: [facility]VAS[facility] CV LAB;  Service: Cardiovascular;  Laterality: N/A;   CHOLECYSTECTOMY  05/18/2012   Procedure: LAPAROSCOPIC CHOLECYSTECTOMY WITH INTRAOPERATIVE CHOLANGIOGRAM;  Surgeon: Valarie Merino, MD;  Location: [facility];  Service: General;  Laterality: N/A;  COLONOSCOPY  10/08/2019   2005, 2010   ERCP  07/11/2012   Procedure: ENDOSCOPIC RETROGRADE CHOLANGIOPANCREATOGRAPHY (ERCP);  Surgeon: Louis Meckel, MD;  Location: [facility];  Service: Endoscopy;  Laterality: N/A;   Hernia repairs     x 3    INGUINAL HERNIA REPAIR     bilateral   LAPAROSCOPIC LYSIS OF ADHESIONS   06/24/2012   Procedure: LAPAROSCOPIC LYSIS OF ADHESIONS;  Surgeon: Crecencio Mc, MD;  Location: [facility];  Service: Urology;;   ROBOTIC ASSITED PARTIAL NEPHRECTOMY  06/24/2012   Procedure: ROBOTIC ASSITED PARTIAL NEPHRECTOMY;  Surgeon: Crecencio Mc, MD;  Location: [facility];  Service: Urology;  Laterality: Right;   TEE WITHOUT CARDIOVERSION N/A 03/09/2023   Procedure: TRANSESOPHAGEAL ECHOCARDIOGRAM;  Surgeon: Christell Constant, MD;  Location: [facility];  Service: Cardiovascular;  Laterality: N/A;   UPPER GASTROINTESTINAL ENDOSCOPY  10/08/2019   2005,2010,2013   Social History:  reports that he has quit smoking. He has never used smokeless tobacco. He reports current alcohol use. He reports that he does not use drugs.  No Known Allergies  Family History  Problem Relation Age of Onset   Lung cancer Mother    Atrial fibrillation Mother    Brain cancer Father    Atrial fibrillation Brother    Colon cancer Neg Hx    Rectal cancer Neg Hx    Stomach cancer Neg Hx    Esophageal cancer Neg Hx     Prior to Admission medications   Medication Sig Start Date End Date Taking? Authorizing Provider  amiodarone (PACERONE) 200 MG tablet Take 1 tablet (200 mg total) by mouth daily. 03/20/23   Carlos Levering, NP  apixaban (ELIQUIS) 5 MG TABS tablet Take 1 tablet (5 mg total) by mouth 2 (two) times daily. 03/12/23 04/11/23  Hughie Closs, MD  Ascorbic Acid (VITAMIN C) 100 MG tablet Take 500 mg by mouth daily.    [provider]  Coenzyme Q10 (COQ10) 100 MG CAPS Take 100 mg by mouth daily.     [provider]  ELDERBERRY PO Take 1 tablet by mouth daily. Gummies    [provider]  metoprolol tartrate (LOPRESSOR) 100 MG tablet Take 0.5 tablets (50 mg total) by mouth every morning AND 1 tablet (100 mg total) every evening. 03/20/23 06/18/23  Carlos Levering, NP  Multiple Vitamin (MULTIVITAMIN) tablet Take 1 tablet by mouth daily.    [provider]  pantoprazole  (PROTONIX) 40 MG tablet Take 1 tablet (40 mg total) by mouth 2 (two) times daily. Take for 8 weeks to promote mucosal healing. 10/08/19   Shellia Cleverly, DO    Physical Exam: Vitals:   03/22/23 0630 03/22/23 0700 03/22/23 0830 03/22/23 0935  BP: (!) 141/83 (!) 155/83 133/70   Pulse: 71 66 70   Resp:   18   Temp:    98.6 F (37 C)  TempSrc:    Oral  SpO2: 95% 96% 94%   Weight:      Height:       Physical Exam Vitals and nursing note reviewed.  Constitutional:      General: He is awake. He is not in acute distress.    Appearance: Normal appearance.  HENT:     Head: Normocephalic.     Nose: No rhinorrhea.     Mouth/Throat:     Mouth: Mucous membranes are moist.  Eyes:     General: No scleral icterus.    Pupils: Pupils are equal, round, and  reactive to light.  Neck:     Vascular: No JVD.  Cardiovascular:     Rate and Rhythm: Normal rate and regular rhythm.     Heart sounds: S1 normal and S2 normal.  Pulmonary:     Effort: Pulmonary effort is normal.     Breath sounds: Normal breath sounds.  Abdominal:     General: Bowel sounds are normal.     Palpations: Abdomen is soft.  Musculoskeletal:     Cervical back: Neck supple.     Right lower leg: No edema.     Left lower leg: No edema.  Skin:    General: Skin is warm and dry.  Neurological:     General: No focal deficit present.     Mental Status: He is alert and oriented to person, place, and time.  Psychiatric:        Mood and Affect: Mood normal.        Behavior: Behavior normal. Behavior is cooperative.     Data Reviewed:  Results are pending, will review when available.  Assessment and Plan: Principal Problem:   AKI (acute kidney injury) (HCC) In the setting of:   Hydronephrosis of left kidney S/P retrograde cystoscopy/laser lithotripsy and bilateral stent placement. Observation/telemetry. Continue IV fluids. Avoid hypotension. Avoid nephrotoxins. Monitor intake and output. Monitor renal function  and electrolytes. Urology consult and procedure appreciated.  Active Problems:   Atrial fibrillation (HCC) Paroxysmal, currently in sinus rhythm. CHA?DS?-VASc Score of at least 1. Continue amiodarone 200 mg p.o. daily.   continue apixaban 5 mg p.o. daily. Continue metoprolol succinate 50 mg p.o. daily. Continue metoprolol succinate 100 mg p.o. nightly.    BPH (benign prostatic hyperplasia) Follow-up with urology as an outpatient.    Normocytic anemia Monitor hematocrit and hemoglobin.    Elevated LFTs   Gilbert's syndrome Monitor LFTs.    Hypoalbuminemia In the setting of acute illness. Protein supplementation.     Advance Care Planning:   Code Status: Full Code   Consults: Urology Di Kindle, MD.).  Family Communication:   Severity of Illness: The appropriate patient status for this patient is OBSERVATION. Observation status is judged to be reasonable and necessary in order to provide the required intensity of service to ensure the patient's safety. The patient's presenting symptoms, physical exam findings, and initial radiographic and laboratory data in the context of their medical condition is felt to place them at decreased risk for further clinical deterioration. Furthermore, it is anticipated that the patient will be medically stable for discharge from the hospital within 2 midnights of admission.   Author: Bobette Mo, MD 03/22/2023 10:05 AM  For on call review [email or web address].   This document was prepared using Dragon voice recognition software and may contain some unintended transcription errors.

## 2023-03-23 ENCOUNTER — Other Ambulatory Visit: Payer: Self-pay

## 2023-03-23 ENCOUNTER — Encounter (HOSPITAL_COMMUNITY): Payer: Self-pay | Admitting: Urology

## 2023-03-23 DIAGNOSIS — Z87442 Personal history of urinary calculi: Secondary | ICD-10-CM | POA: Diagnosis not present

## 2023-03-23 DIAGNOSIS — Z801 Family history of malignant neoplasm of trachea, bronchus and lung: Secondary | ICD-10-CM | POA: Diagnosis not present

## 2023-03-23 DIAGNOSIS — Z808 Family history of malignant neoplasm of other organs or systems: Secondary | ICD-10-CM | POA: Diagnosis not present

## 2023-03-23 DIAGNOSIS — Z7901 Long term (current) use of anticoagulants: Secondary | ICD-10-CM | POA: Diagnosis not present

## 2023-03-23 DIAGNOSIS — I429 Cardiomyopathy, unspecified: Secondary | ICD-10-CM | POA: Diagnosis present

## 2023-03-23 DIAGNOSIS — Z79899 Other long term (current) drug therapy: Secondary | ICD-10-CM | POA: Diagnosis not present

## 2023-03-23 DIAGNOSIS — D649 Anemia, unspecified: Secondary | ICD-10-CM | POA: Diagnosis present

## 2023-03-23 DIAGNOSIS — N400 Enlarged prostate without lower urinary tract symptoms: Secondary | ICD-10-CM | POA: Diagnosis present

## 2023-03-23 DIAGNOSIS — I4819 Other persistent atrial fibrillation: Secondary | ICD-10-CM | POA: Diagnosis present

## 2023-03-23 DIAGNOSIS — N200 Calculus of kidney: Secondary | ICD-10-CM

## 2023-03-23 DIAGNOSIS — R32 Unspecified urinary incontinence: Secondary | ICD-10-CM | POA: Diagnosis present

## 2023-03-23 DIAGNOSIS — N132 Hydronephrosis with renal and ureteral calculous obstruction: Secondary | ICD-10-CM | POA: Diagnosis present

## 2023-03-23 DIAGNOSIS — R310 Gross hematuria: Secondary | ICD-10-CM | POA: Diagnosis not present

## 2023-03-23 DIAGNOSIS — I5022 Chronic systolic (congestive) heart failure: Secondary | ICD-10-CM | POA: Diagnosis present

## 2023-03-23 DIAGNOSIS — I5020 Unspecified systolic (congestive) heart failure: Secondary | ICD-10-CM | POA: Diagnosis not present

## 2023-03-23 DIAGNOSIS — K219 Gastro-esophageal reflux disease without esophagitis: Secondary | ICD-10-CM | POA: Diagnosis present

## 2023-03-23 DIAGNOSIS — Z9049 Acquired absence of other specified parts of digestive tract: Secondary | ICD-10-CM | POA: Diagnosis not present

## 2023-03-23 DIAGNOSIS — I951 Orthostatic hypotension: Secondary | ICD-10-CM | POA: Diagnosis present

## 2023-03-23 DIAGNOSIS — I3139 Other pericardial effusion (noninflammatory): Secondary | ICD-10-CM | POA: Diagnosis present

## 2023-03-23 DIAGNOSIS — N179 Acute kidney failure, unspecified: Secondary | ICD-10-CM | POA: Diagnosis present

## 2023-03-23 DIAGNOSIS — N1330 Unspecified hydronephrosis: Secondary | ICD-10-CM | POA: Diagnosis not present

## 2023-03-23 DIAGNOSIS — E8809 Other disorders of plasma-protein metabolism, not elsewhere classified: Secondary | ICD-10-CM | POA: Diagnosis present

## 2023-03-23 DIAGNOSIS — Z905 Acquired absence of kidney: Secondary | ICD-10-CM | POA: Diagnosis not present

## 2023-03-23 DIAGNOSIS — Z87891 Personal history of nicotine dependence: Secondary | ICD-10-CM | POA: Diagnosis not present

## 2023-03-23 DIAGNOSIS — I480 Paroxysmal atrial fibrillation: Secondary | ICD-10-CM | POA: Diagnosis not present

## 2023-03-23 LAB — COMPREHENSIVE METABOLIC PANEL
ALT: 15 U/L (ref 0–44)
AST: 12 U/L — ABNORMAL LOW (ref 15–41)
Albumin: 2.5 g/dL — ABNORMAL LOW (ref 3.5–5.0)
Alkaline Phosphatase: 80 U/L (ref 38–126)
Anion gap: 9 (ref 5–15)
BUN: 25 mg/dL — ABNORMAL HIGH (ref 8–23)
CO2: 22 mmol/L (ref 22–32)
Calcium: 8.2 mg/dL — ABNORMAL LOW (ref 8.9–10.3)
Chloride: 103 mmol/L (ref 98–111)
Creatinine, Ser: 1.54 mg/dL — ABNORMAL HIGH (ref 0.61–1.24)
GFR, Estimated: 48 mL/min — ABNORMAL LOW (ref >60)
Glucose, Bld: 135 mg/dL — ABNORMAL HIGH (ref 70–99)
Potassium: 4.2 mmol/L (ref 3.5–5.1)
Sodium: 134 mmol/L — ABNORMAL LOW (ref 135–145)
Total Bilirubin: 1.3 mg/dL — ABNORMAL HIGH (ref 0.3–1.2)
Total Protein: 5.7 g/dL — ABNORMAL LOW (ref 6.5–8.1)

## 2023-03-23 LAB — CBC
HCT: 32.3 % — ABNORMAL LOW (ref 39.0–52.0)
Hemoglobin: 10.7 g/dL — ABNORMAL LOW (ref 13.0–17.0)
MCH: 29.6 pg (ref 26.0–34.0)
MCHC: 33.1 g/dL (ref 30.0–36.0)
MCV: 89.5 fL (ref 80.0–100.0)
Platelets: 378 10*3/uL (ref 150–400)
RBC: 3.61 MIL/uL — ABNORMAL LOW (ref 4.22–5.81)
RDW: 12.3 % (ref 11.5–15.5)
WBC: 10.3 10*3/uL (ref 4.0–10.5)
nRBC: 0 % (ref 0.0–0.2)

## 2023-03-23 MED ORDER — SODIUM CHLORIDE 0.9 % IV SOLN: INTRAVENOUS | Status: DC

## 2023-03-23 MED ORDER — METOPROLOL TARTRATE 5 MG/5ML IV SOLN
2.5000 mg | Freq: Three times a day (TID) | INTRAVENOUS | Status: AC | PRN
  Administered 2023-03-23: 2.5 mg via INTRAVENOUS
  Filled 2023-03-23 (×2): qty 5

## 2023-03-23 MED ORDER — ORAL CARE MOUTH RINSE: 15.0000 mL | OROMUCOSAL | Status: DC | PRN

## 2023-03-23 NOTE — Progress Notes (Signed)
1 Day Post-Op Subjective: NAEON. Pt is having some incontinence and hematuria.   Objective: Vital signs in last 24 hours: Temp:  [98 F (36.7 C)-98.9 F (37.2 C)] 98 F (36.7 C) (09/06 0600) Pulse Rate:  [39-130] 130 (09/06 0911) Resp:  [9-20] 18 (09/06 0600) BP: (103-159)/(65-93) 120/80 (09/06 0600) SpO2:  [93 %-100 %] 96 % (09/06 0600) Weight:  [90.7 kg] 90.7 kg (09/05 1244)  Assessment/Plan: # Ureteral stones S/p bilateral ureteral stent placement..  Right stent is tethered and will be removed in 4 to 5 days, around 9/10.  Laser lithotripsy will need to be scheduled for left side ureteral stone on an outpatient basis. Recommend 5 days oral ABX on discharge.  # Hematuria Patient reports worsening hematuria over the last several hours.  He is wearing a condom catheter and is experiencing moderate level of bleeding.  Clear cherry red urine present in collection bag.  This will probably be self-limiting and would recommend continuing to hold anticoagulation if medically possible. We will see him tomorrow  Intake/Output from previous day: 09/05 0701 - 09/06 0700 In: 1289.3 [P.O.:360; I.V.:829.3; IV Piggyback:100] Out: 2475 [Urine:2475]  Intake/Output this shift: No intake/output data recorded.  Physical Exam:  General: Alert and oriented CV: No cyanosis Lungs: equal chest rise Gu: condom cath, cherry red urine  Lab Results: Recent Labs    03/22/23 0523 03/23/23 0513  HGB 11.1* 10.7*  HCT 33.5* 32.3*   BMET Recent Labs    03/22/23 0523 03/23/23 0513  NA 136 134*  K 4.0 4.2  CL 104 103  CO2 20* 22  GLUCOSE 123* 135*  BUN 29* 25*  CREATININE 1.90* 1.54*  CALCIUM 8.7* 8.2*     Studies/Results: DG C-Arm 1-60 Min-No Report  Result Date: 03/22/2023 Fluoroscopy was utilized by the requesting physician.  No radiographic interpretation.   CT Renal Stone Study  Result Date: 03/22/2023 CLINICAL DATA:  Abdominal/flank pain. Stone suspected with left flank  pain. EXAM: CT ABDOMEN AND PELVIS WITHOUT CONTRAST TECHNIQUE: Multidetector CT imaging of the abdomen and pelvis was performed following the standard protocol without IV contrast. RADIATION DOSE REDUCTION: This exam was performed according to the departmental dose-optimization program which includes automated exposure control, adjustment of the mA and/or kV according to patient size and/or use of iterative reconstruction technique. COMPARISON:  CT 09/17/2019 FINDINGS: Lower chest: Small left pleural effusion. Compressive atelectasis in left lower lobe. Small dependent densities in the posterior right lower lobe. At least 2 small nodular densities near the right lung base largest measuring 4 mm on image 11/10. Small to moderate amount of pericardial fluid. Pericardial fluid is new since 2021 but was noted on recent transesophageal echocardiogram from 03/09/2023. Hepatobiliary: Cholecystectomy. Stable calcification near the liver capsule in the anterior liver on image 28/2. Mild dilatation of the extrahepatic biliary system appears similar to the previous examination. Pancreas: Unremarkable. No pancreatic ductal dilatation or surrounding inflammatory changes. Spleen: Normal in size without focal abnormality. Adrenals/Urinary Tract: Normal adrenal glands. Irregularity along the lateral aspect of the right kidney is likely associated with scarring. No definite right renal calculi. No right hydronephrosis. However, there is a 9 mm stone in the distal right ureter. Left perinephric stranding. Moderate to severe left hydronephrosis. Two stones within the left kidney largest measures 4 mm in the interpolar region. Large obstructing stone at the left ureteropelvic junction that measures up to 1.0 cm. Stranding along the left retroperitoneal along the length of the left ureter. Normal appearance of the urinary bladder.  Stomach/Bowel: Normal appearance of the stomach. Few dilated loops of small bowel in the anterior abdomen on  image 53/2. Small bowel measures up to 4.6 cm. No evidence for focal bowel inflammation. Vascular/Lymphatic: Vascular structures are unremarkable. Again noted are prominent central mesenteric lymph nodes best seen on image 55/2. These prominent mesenteric lymph nodes appear to be chronic. Reproductive: Prostate is unremarkable. Other: Small amount of free fluid in the pelvis. Negative for free air. Small left inguinal hernia containing fat. Small umbilical hernia containing fat. Musculoskeletal: No acute bone abnormality. IMPRESSION: 1. Moderate to severe left hydronephrosis secondary to a 1 cm obstructing stone at the left ureteropelvic junction. Extensive left perinephric and retroperitoneal stranding associated with the urinary obstruction. Additional small left renal calculi. 2. Nonobstructive 9 mm stone in the distal right ureter. Chronic cortical scarring involving the right kidney. 3. Dilated loops of small bowel in the anterior abdomen. Findings are nonspecific. Focal ileus or partial bowel obstruction cannot be excluded. Small amount of free fluid in the pelvis. 4. Small left pleural effusion and pericardial effusion. Please note that the pericardial effusion was noted on the echocardiogram from 03/09/2023. 5. Prominent lymph nodes in the central aspect of the mesentery. These findings appear to be chronic. 6. Small nodular densities at the right lung base, largest measures 4 mm. No follow-up needed if patient is low-risk. Non-contrast chest CT can be considered in 12 months if patient is high-risk. This recommendation follows the consensus statement: Guidelines for Management of Incidental Pulmonary Nodules Detected on CT Images: From the Fleischner Society 2017; Radiology 2017; 284:228-243. Electronically Signed   By: Richarda Overlie M.D.   On: 03/22/2023 08:53      LOS: 0 days   Elmon Kirschner, NP [facility] Pager: [phone]  03/23/2023, 9:12 AM

## 2023-03-23 NOTE — Progress Notes (Signed)
Mobility Specialist - Progress Note   03/23/23 1103  Mobility  Activity Ambulated independently to bathroom  Level of Assistance Independent  Assistive Device None  Distance Ambulated (ft) 20 ft  Range of Motion/Exercises Active  Activity Response Tolerated well  Mobility Referral Yes  $Mobility charge 1 Mobility  Mobility Specialist Start Time (ACUTE ONLY) 1055  Mobility Specialist Stop Time (ACUTE ONLY) 1103  Mobility Specialist Time Calculation (min) (ACUTE ONLY) 8 min   Pt was found on recliner chair and agreeable to ambulate in room. Pt ambulated to bathroom and back to recliner chair. All needs met. Call bell in reach.  Billey Chang Mobility Specialist

## 2023-03-23 NOTE — Progress Notes (Signed)
PROGRESS NOTE    Syion Ouyang  GUY:[id number] DOB: 1950-09-19 DOA: 03/22/2023 PCP: Patient, No Pcp Per   Brief Narrative: 72 year old male admitted with left flank pain found to have 1 cm obstructing stone in the left UPJ with severe left hydronephrosis status post retrograde cystoscopy with bilateral ureteral urolith removal and bilateral stent placement.  Past medical history is complex for right renal mass, small bowel obstruction, BPH, atrial fibrillation, kidney stones, Gilbert disease.  Patient has been on Eliquis prior to admission and continues to have gross hematuria, Eliquis being held.   CT renal stone study showing moderate to severe left hydronephrosis secondary to a 1 cm obstructing stone at the left UPJ.  Extensive left perinephric and retroperitoneal stranding associated with the urinary obstruction.  Additional small left renal calculi.  Nonobstructive 9 mm stone in the distal right ureter.  Chronic cortical scarring involving the right kidney.  Dilated loops of small bowel in the anterior abdomen.  Focal ileus or partial SBO cannot be excluded.  Small amount of free fluid in the pelvis.  Small left pleural effusion and pericardial effusion.  Prominent lymph nodes in the mesentery which appear to be chronic.  Small nodular densities at the right lung base, largest measured 4 mm.  No follow-up needed for patient is low risk.  Noncontrast chest CT in 12 months if the patient is high risk.   Assessment & Plan:   Principal Problem:   Hydronephrosis of left kidney Active Problems:   Normocytic anemia   Elevated LFTs   Atrial fibrillation (HCC)   BPH (benign prostatic hyperplasia)   Gilbert's syndrome   AKI (acute kidney injury) (HCC)   Hypoalbuminemia    AKI due to kidney stones with   Hydronephrosis of left kidney S/P retrograde cystoscopy/laser lithotripsy and bilateral stent placement.renal functions improving cr 1.54 from 1.90 He has ongoing hematuria in spite of holding  Eliquis.  Will monitor closely.  Urology recommends   Laser lithotripsy on the  left side ureteral stone as an outpatient and right stent to be removed in 4 to 5 days.  Gross hematuria-he reports grossly bloody urine.condom cath in place.continue to hold eliquis Follow up Cbc later today Hb 10.7 and stable so far   Atrial fibrillation -   Continue amiodarone 200 mg p.o. daily.   Hold apixaban . Continue metoprolol succinate 50 mg p.o. daily. Continue metoprolol succinate 100 mg p.o. nightly.  BPH-on flomax follow-up with urology  Gilbert's syndrome- t bili 1.3 stable   Estimated body mass index is 25.68 kg/m as calculated from the following:   Height as of this encounter: 6\' 2"  (1.88 m).   Weight as of this encounter: 90.7 kg.  DVT prophylaxis: SCD Code Status: FULL Family Communication: NONE  Disposition Plan:  Status is: The patient will require care spanning > 2 midnights and should be moved to inpatient because: Ongoing hematuria   Consultants:  UROLOGY  Procedures: retrograde cystoscopy/laser lithotripsy and bilateral stent placement. 9/5 Antimicrobials: OMNICEF  Subjective:  C/o blood in urine  Objective: Vitals:   03/22/23 2334 03/23/23 0400 03/23/23 0600 03/23/23 0911  BP: 103/72 (!) 133/93 120/80   Pulse: (!) 109 (!) 111 (!) 118 (!) 130  Resp: 18 18 18    Temp: 98.4 F (36.9 C) 98.2 F (36.8 C) 98 F (36.7 C)   TempSrc: Oral Oral    SpO2: 97% 96% 96%   Weight:      Height:        Intake/Output Summary (  Last 24 hours) at 03/23/2023 1104 Last data filed at 03/23/2023 0915 Gross per 24 hour  Intake 1289.26 ml  Output 3225 ml  Net -1935.74 ml   Filed Weights   03/22/23 0528 03/22/23 1244  Weight: 90.7 kg 90.7 kg    Examination:urine blood tinged   General exam: Appears in nad Respiratory system: Clear to auscultation. Respiratory effort normal. Cardiovascular system: S1 & S2 heard, RRR. No JVD, murmurs, rubs, gallops or clicks. No pedal  edema. Gastrointestinal system: Abdomen is nondistended, soft and nontender. No organomegaly or masses felt. Normal bowel sounds heard. Central nervous system: Alert and oriented. No focal neurological deficits. Extremities: no edema Data Reviewed: I have personally reviewed following labs and imaging studies  CBC: Recent Labs  Lab 03/22/23 0523 03/23/23 0513  WBC 12.4* 10.3  NEUTROABS 10.8*  --   HGB 11.1* 10.7*  HCT 33.5* 32.3*  MCV 91.5 89.5  PLT 420* 378   Basic Metabolic Panel: Recent Labs  Lab 03/22/23 0523 03/23/23 0513  NA 136 134*  K 4.0 4.2  CL 104 103  CO2 20* 22  GLUCOSE 123* 135*  BUN 29* 25*  CREATININE 1.90* 1.54*  CALCIUM 8.7* 8.2*  MG 2.1  --   PHOS 3.0  --    GFR: Estimated Creatinine Clearance: 50.4 mL/min (A) (by C-G formula based on SCr of 1.54 mg/dL (H)). Liver Function Tests: Recent Labs  Lab 03/22/23 0523 03/23/23 0513  AST 16 12*  ALT 21 15  ALKPHOS 92 80  BILITOT 1.4* 1.3*  PROT 7.0 5.7*  ALBUMIN 3.2* 2.5*   Recent Labs  Lab 03/22/23 0523  LIPASE 24   No results for input(s): "AMMONIA" in the last 168 hours. Coagulation Profile: No results for input(s): "INR", "PROTIME" in the last 168 hours. Cardiac Enzymes: No results for input(s): "CKTOTAL", "CKMB", "CKMBINDEX", "TROPONINI" in the last 168 hours. BNP (last 3 results) No results for input(s): "PROBNP" in the last 8760 hours. HbA1C: No results for input(s): "HGBA1C" in the last 72 hours. CBG: No results for input(s): "GLUCAP" in the last 168 hours. Lipid Profile: No results for input(s): "CHOL", "HDL", "LDLCALC", "TRIG", "CHOLHDL", "LDLDIRECT" in the last 72 hours. Thyroid Function Tests: No results for input(s): "TSH", "T4TOTAL", "FREET4", "T3FREE", "THYROIDAB" in the last 72 hours. Anemia Panel: No results for input(s): "VITAMINB12", "FOLATE", "FERRITIN", "TIBC", "IRON", "RETICCTPCT" in the last 72 hours. Sepsis Labs: No results for input(s): "PROCALCITON",  "LATICACIDVEN" in the last 168 hours.  No results found for this or any previous visit (from the past 240 hour(s)).       Radiology Studies: DG C-Arm 1-60 Min-No Report  Result Date: 03/22/2023 Fluoroscopy was utilized by the requesting physician.  No radiographic interpretation.   CT Renal Stone Study  Result Date: 03/22/2023 CLINICAL DATA:  Abdominal/flank pain. Stone suspected with left flank pain. EXAM: CT ABDOMEN AND PELVIS WITHOUT CONTRAST TECHNIQUE: Multidetector CT imaging of the abdomen and pelvis was performed following the standard protocol without IV contrast. RADIATION DOSE REDUCTION: This exam was performed according to the departmental dose-optimization program which includes automated exposure control, adjustment of the mA and/or kV according to patient size and/or use of iterative reconstruction technique. COMPARISON:  CT 09/17/2019 FINDINGS: Lower chest: Small left pleural effusion. Compressive atelectasis in left lower lobe. Small dependent densities in the posterior right lower lobe. At least 2 small nodular densities near the right lung base largest measuring 4 mm on image 11/10. Small to moderate amount of pericardial fluid. Pericardial fluid is  new since 2021 but was noted on recent transesophageal echocardiogram from 03/09/2023. Hepatobiliary: Cholecystectomy. Stable calcification near the liver capsule in the anterior liver on image 28/2. Mild dilatation of the extrahepatic biliary system appears similar to the previous examination. Pancreas: Unremarkable. No pancreatic ductal dilatation or surrounding inflammatory changes. Spleen: Normal in size without focal abnormality. Adrenals/Urinary Tract: Normal adrenal glands. Irregularity along the lateral aspect of the right kidney is likely associated with scarring. No definite right renal calculi. No right hydronephrosis. However, there is a 9 mm stone in the distal right ureter. Left perinephric stranding. Moderate to severe left  hydronephrosis. Two stones within the left kidney largest measures 4 mm in the interpolar region. Large obstructing stone at the left ureteropelvic junction that measures up to 1.0 cm. Stranding along the left retroperitoneal along the length of the left ureter. Normal appearance of the urinary bladder. Stomach/Bowel: Normal appearance of the stomach. Few dilated loops of small bowel in the anterior abdomen on image 53/2. Small bowel measures up to 4.6 cm. No evidence for focal bowel inflammation. Vascular/Lymphatic: Vascular structures are unremarkable. Again noted are prominent central mesenteric lymph nodes best seen on image 55/2. These prominent mesenteric lymph nodes appear to be chronic. Reproductive: Prostate is unremarkable. Other: Small amount of free fluid in the pelvis. Negative for free air. Small left inguinal hernia containing fat. Small umbilical hernia containing fat. Musculoskeletal: No acute bone abnormality. IMPRESSION: 1. Moderate to severe left hydronephrosis secondary to a 1 cm obstructing stone at the left ureteropelvic junction. Extensive left perinephric and retroperitoneal stranding associated with the urinary obstruction. Additional small left renal calculi. 2. Nonobstructive 9 mm stone in the distal right ureter. Chronic cortical scarring involving the right kidney. 3. Dilated loops of small bowel in the anterior abdomen. Findings are nonspecific. Focal ileus or partial bowel obstruction cannot be excluded. Small amount of free fluid in the pelvis. 4. Small left pleural effusion and pericardial effusion. Please note that the pericardial effusion was noted on the echocardiogram from 03/09/2023. 5. Prominent lymph nodes in the central aspect of the mesentery. These findings appear to be chronic. 6. Small nodular densities at the right lung base, largest measures 4 mm. No follow-up needed if patient is low-risk. Non-contrast chest CT can be considered in 12 months if patient is high-risk.  This recommendation follows the consensus statement: Guidelines for Management of Incidental Pulmonary Nodules Detected on CT Images: From the Fleischner Society 2017; Radiology 2017; 284:228-243. Electronically Signed   By: Richarda Overlie M.D.   On: 03/22/2023 08:53        Scheduled Meds:  amiodarone  200 mg Oral Daily   apixaban  5 mg Oral BID   cefdinir  300 mg Oral Q12H   metoprolol tartrate  50 mg Oral Daily   And   metoprolol tartrate  100 mg Oral QHS   tamsulosin  0.4 mg Oral QPC supper   Continuous Infusions:   LOS: 0 days    Time spent: 40 min  Alwyn Ren, MD 03/23/2023, 11:04 AM

## 2023-03-24 DIAGNOSIS — N1330 Unspecified hydronephrosis: Secondary | ICD-10-CM | POA: Diagnosis not present

## 2023-03-24 DIAGNOSIS — I5020 Unspecified systolic (congestive) heart failure: Secondary | ICD-10-CM | POA: Diagnosis not present

## 2023-03-24 DIAGNOSIS — I480 Paroxysmal atrial fibrillation: Secondary | ICD-10-CM | POA: Diagnosis not present

## 2023-03-24 LAB — COMPREHENSIVE METABOLIC PANEL
ALT: 18 U/L (ref 0–44)
AST: 14 U/L — ABNORMAL LOW (ref 15–41)
Albumin: 2.4 g/dL — ABNORMAL LOW (ref 3.5–5.0)
Alkaline Phosphatase: 72 U/L (ref 38–126)
Anion gap: 9 (ref 5–15)
BUN: 27 mg/dL — ABNORMAL HIGH (ref 8–23)
CO2: 23 mmol/L (ref 22–32)
Calcium: 8.3 mg/dL — ABNORMAL LOW (ref 8.9–10.3)
Chloride: 105 mmol/L (ref 98–111)
Creatinine, Ser: 1.18 mg/dL (ref 0.61–1.24)
GFR, Estimated: >60 mL/min (ref >60)
Glucose, Bld: 106 mg/dL — ABNORMAL HIGH (ref 70–99)
Potassium: 3.6 mmol/L (ref 3.5–5.1)
Sodium: 137 mmol/L (ref 135–145)
Total Bilirubin: 0.8 mg/dL (ref 0.3–1.2)
Total Protein: 5.5 g/dL — ABNORMAL LOW (ref 6.5–8.1)

## 2023-03-24 LAB — CBC
HCT: 31.5 % — ABNORMAL LOW (ref 39.0–52.0)
Hemoglobin: 10.2 g/dL — ABNORMAL LOW (ref 13.0–17.0)
MCH: 29.8 pg (ref 26.0–34.0)
MCHC: 32.4 g/dL (ref 30.0–36.0)
MCV: 92.1 fL (ref 80.0–100.0)
Platelets: 384 10*3/uL (ref 150–400)
RBC: 3.42 MIL/uL — ABNORMAL LOW (ref 4.22–5.81)
RDW: 12.5 % (ref 11.5–15.5)
WBC: 10.4 10*3/uL (ref 4.0–10.5)
nRBC: 0 % (ref 0.0–0.2)

## 2023-03-24 LAB — MAGNESIUM: Magnesium: 1.9 mg/dL (ref 1.7–2.4)

## 2023-03-24 MED ORDER — DIGOXIN 125 MCG PO TABS
0.2500 mg | ORAL_TABLET | Freq: Every day | ORAL | Status: DC
  Administered 2023-03-25: 0.25 mg via ORAL
  Filled 2023-03-24: qty 2

## 2023-03-24 MED ORDER — METOPROLOL TARTRATE 5 MG/5ML IV SOLN
2.5000 mg | Freq: Four times a day (QID) | INTRAVENOUS | Status: DC | PRN
  Administered 2023-03-24: 2.5 mg via INTRAVENOUS

## 2023-03-24 MED ORDER — APIXABAN 5 MG PO TABS: 5.0000 mg | ORAL_TABLET | Freq: Two times a day (BID) | ORAL | Status: DC

## 2023-03-24 MED ORDER — METOPROLOL TARTRATE 50 MG PO TABS
100.0000 mg | ORAL_TABLET | Freq: Two times a day (BID) | ORAL | Status: DC
  Administered 2023-03-24 – 2023-03-25 (×2): 100 mg via ORAL
  Filled 2023-03-24 (×2): qty 2

## 2023-03-24 MED ORDER — POTASSIUM CHLORIDE CRYS ER 20 MEQ PO TBCR
40.0000 meq | EXTENDED_RELEASE_TABLET | Freq: Once | ORAL | Status: AC
  Administered 2023-03-24: 40 meq via ORAL
  Filled 2023-03-24: qty 2

## 2023-03-24 MED ORDER — METOPROLOL TARTRATE 50 MG PO TABS
50.0000 mg | ORAL_TABLET | Freq: Once | ORAL | Status: AC
  Administered 2023-03-24: 50 mg via ORAL
  Filled 2023-03-24: qty 1

## 2023-03-24 MED ORDER — DILTIAZEM HCL 25 MG/5ML IV SOLN
10.0000 mg | Freq: Once | INTRAVENOUS | Status: DC
  Filled 2023-03-24: qty 5

## 2023-03-24 MED ORDER — DILTIAZEM LOAD VIA INFUSION: 10.0000 mg | Freq: Once | INTRAVENOUS | Status: DC

## 2023-03-24 MED ORDER — APIXABAN 5 MG PO TABS
5.0000 mg | ORAL_TABLET | Freq: Two times a day (BID) | ORAL | Status: DC
  Administered 2023-03-24 – 2023-03-25 (×3): 5 mg via ORAL
  Filled 2023-03-24 (×3): qty 1

## 2023-03-24 MED ORDER — DIGOXIN 0.25 MG/ML IJ SOLN
0.2500 mg | Freq: Once | INTRAMUSCULAR | Status: AC
  Administered 2023-03-24: 0.25 mg via INTRAVENOUS
  Filled 2023-03-24: qty 2

## 2023-03-24 NOTE — Progress Notes (Signed)
PROGRESS NOTE    Kevin Doyle  UJW:[id number] DOB: 01/30/1951 DOA: 03/22/2023 PCP: Patient, No Pcp Per   Brief Narrative: 72 year old male admitted with left flank pain found to have 1 cm obstructing stone in the left UPJ with severe left hydronephrosis status post retrograde cystoscopy with bilateral ureteral urolith removal and bilateral stent placement.  Past medical history is complex for right renal mass, small bowel obstruction, BPH, atrial fibrillation, kidney stones, Gilbert disease.  Patient has been on Eliquis prior to admission and continues to have gross hematuria, Eliquis being held.   CT renal stone study showing moderate to severe left hydronephrosis secondary to a 1 cm obstructing stone at the left UPJ.  Extensive left perinephric and retroperitoneal stranding associated with the urinary obstruction.  Additional small left renal calculi.  Nonobstructive 9 mm stone in the distal right ureter.  Chronic cortical scarring involving the right kidney.  Dilated loops of small bowel in the anterior abdomen.  Focal ileus or partial SBO cannot be excluded.  Small amount of free fluid in the pelvis.  Small left pleural effusion and pericardial effusion.  Prominent lymph nodes in the mesentery which appear to be chronic.  Small nodular densities at the right lung base, largest measured 4 mm.  No follow-up needed for patient is low risk.  Noncontrast chest CT in 12 months if the patient is high risk.   Assessment & Plan:   Principal Problem:   Hydronephrosis of left kidney Active Problems:   Normocytic anemia   Elevated LFTs   Atrial fibrillation (HCC)   BPH (benign prostatic hyperplasia)   Gilbert's syndrome   AKI (acute kidney injury) (HCC)   Hypoalbuminemia    AKI resolved status post stenting  due to kidney stones with   Hydronephrosis of left kidney S/P retrograde cystoscopy/laser lithotripsy and bilateral stent placement.renal functions improving cr 1.18 from 1.54 from 1.90 He  has ongoing hematuria in spite of holding Eliquis.  Will monitor closely.  Urology recommends   Laser lithotripsy on the  left side ureteral stone as an outpatient and right stent to be removed in 4 to 5 days.  Gross hematuria- improved H and h stable   Atrial fibrillation -   Continue amiodarone 200 mg p.o. daily.   Restart apixaban . Continue metoprolol succinate  Added dig 9/7  BPH-on flomax follow-up with urology  Gilbert's syndrome-  stable   Estimated body mass index is 25.68 kg/m as calculated from the following:   Height as of this encounter: 6\' 2"  (1.88 m).   Weight as of this encounter: 90.7 kg.  DVT prophylaxis: SCD Code Status: FULL Family Communication: NONE  Disposition Plan:  Status is: The patient will require care spanning > 2 midnights and should be moved to inpatient because: Ongoing hematuria   Consultants:  UROLOGY cardiology  Procedures: retrograde cystoscopy/laser lithotripsy and bilateral stent placement. 9/5 Antimicrobials: OMNICEF  Subjective:   Anxious  C/o palpitations Afib rvr Cardiology consulted  Objective: Vitals:   03/24/23 0226 03/24/23 0750 03/24/23 0901 03/24/23 1400  BP: 120/72 121/73 110/61 121/79  Pulse: (!) 126 (!) 125 (!) 142 (!) 118  Resp: 16 20 17 18   Temp: 98.7 F (37.1 C) 98.3 F (36.8 C) 98.3 F (36.8 C) 98.1 F (36.7 C)  TempSrc: Oral Oral Oral Oral  SpO2: 96% 96% 96% 98%  Weight:      Height:        Intake/Output Summary (Last 24 hours) at 03/24/2023 1440 Last data filed at  03/24/2023 0900 Gross per 24 hour  Intake 1219.51 ml  Output 1550 ml  Net -330.49 ml   Filed Weights   03/22/23 0528 03/22/23 1244  Weight: 90.7 kg 90.7 kg    Examination:urine blood tinged   General exam: Appears in nad Respiratory system: Clear to auscultation. Respiratory effort normal. Cardiovascular system: S1 & S2 heard, RRR. No JVD, murmurs, rubs, gallops or clicks. No pedal edema. Gastrointestinal system: Abdomen is  nondistended, soft and nontender. No organomegaly or masses felt. Normal bowel sounds heard. Central nervous system: Alert and oriented. No focal neurological deficits. Extremities: no edema Data Reviewed: I have personally reviewed following labs and imaging studies  CBC: Recent Labs  Lab 03/22/23 0523 03/23/23 0513 03/24/23 0507  WBC 12.4* 10.3 10.4  NEUTROABS 10.8*  --   --   HGB 11.1* 10.7* 10.2*  HCT 33.5* 32.3* 31.5*  MCV 91.5 89.5 92.1  PLT 420* 378 384   Basic Metabolic Panel: Recent Labs  Lab 03/22/23 0523 03/23/23 0513 03/24/23 0507  NA 136 134* 137  K 4.0 4.2 3.6  CL 104 103 105  CO2 20* 22 23  GLUCOSE 123* 135* 106*  BUN 29* 25* 27*  CREATININE 1.90* 1.54* 1.18  CALCIUM 8.7* 8.2* 8.3*  MG 2.1  --  1.9  PHOS 3.0  --   --    GFR: Estimated Creatinine Clearance: 65.8 mL/min (by C-G formula based on SCr of 1.18 mg/dL). Liver Function Tests: Recent Labs  Lab 03/22/23 0523 03/23/23 0513 03/24/23 0507  AST 16 12* 14*  ALT 21 15 18   ALKPHOS 92 80 72  BILITOT 1.4* 1.3* 0.8  PROT 7.0 5.7* 5.5*  ALBUMIN 3.2* 2.5* 2.4*   Recent Labs  Lab 03/22/23 0523  LIPASE 24   No results for input(s): "AMMONIA" in the last 168 hours. Coagulation Profile: No results for input(s): "INR", "PROTIME" in the last 168 hours. Cardiac Enzymes: No results for input(s): "CKTOTAL", "CKMB", "CKMBINDEX", "TROPONINI" in the last 168 hours. BNP (last 3 results) No results for input(s): "PROBNP" in the last 8760 hours. HbA1C: No results for input(s): "HGBA1C" in the last 72 hours. CBG: No results for input(s): "GLUCAP" in the last 168 hours. Lipid Profile: No results for input(s): "CHOL", "HDL", "LDLCALC", "TRIG", "CHOLHDL", "LDLDIRECT" in the last 72 hours. Thyroid Function Tests: No results for input(s): "TSH", "T4TOTAL", "FREET4", "T3FREE", "THYROIDAB" in the last 72 hours. Anemia Panel: No results for input(s): "VITAMINB12", "FOLATE", "FERRITIN", "TIBC", "IRON",  "RETICCTPCT" in the last 72 hours. Sepsis Labs: No results for input(s): "PROCALCITON", "LATICACIDVEN" in the last 168 hours.  No results found for this or any previous visit (from the past 240 hour(s)).       Radiology Studies: DG C-Arm 1-60 Min-No Report  Result Date: 03/22/2023 Fluoroscopy was utilized by the requesting physician.  No radiographic interpretation.        Scheduled Meds:  amiodarone  200 mg Oral Daily   apixaban  5 mg Oral BID   cefdinir  300 mg Oral Q12H   [START ON 03/25/2023] digoxin  0.25 mg Oral Daily   diltiazem  10 mg Intravenous Once   metoprolol tartrate  100 mg Oral BID   metoprolol tartrate  50 mg Oral Once   tamsulosin  0.4 mg Oral QPC supper   Continuous Infusions:  sodium chloride 50 mL/hr at 03/24/23 1428     LOS: 1 day    Time spent: 40 min  Alwyn Ren, MD 03/24/2023, 2:40 PM

## 2023-03-24 NOTE — Progress Notes (Signed)
   2 Days Post-Op Subjective: Hematuria improved today, urine appears tea-colored in condom catheter. Persistent afib to 120s. Patient otherwise feeling well.   Objective: Vital signs in last 24 hours: Temp:  [97.8 F (36.6 C)-98.7 F (37.1 C)] 98.3 F (36.8 C) (09/07 0750) Pulse Rate:  [123-142] 142 (09/07 0901) Resp:  [16-20] 20 (09/07 0750) BP: (96-127)/(61-87) 110/61 (09/07 0901) SpO2:  [96 %-100 %] 96 % (09/07 0750)  Assessment/Plan: # Ureteral stones S/p bilateral ureteral stent placement..  Right stent is tethered and will be removed in 4 to 5 days, around 9/10.  Laser lithotripsy will need to be scheduled for left side ureteral stone on an outpatient basis. Recommend 5 days oral ABX on discharge.  # Hematuria Clear tea-colored urine present in collection bag.  This will probably be self-limiting and would recommend continuing to hold anticoagulation for one more day if medically possible.   Intake/Output from previous day: 09/06 0701 - 09/07 0700 In: 1339.5 [P.O.:600; I.V.:739.5] Out: 2250 [Urine:2250]  Intake/Output this shift: No intake/output data recorded.  Physical Exam:  General: Alert and oriented CV: No cyanosis Lungs: equal chest rise Gu: condom cath, cherry red urine  Lab Results: Recent Labs    03/22/23 0523 03/23/23 0513 03/24/23 0507  HGB 11.1* 10.7* 10.2*  HCT 33.5* 32.3* 31.5*   BMET Recent Labs    03/23/23 0513 03/24/23 0507  NA 134* 137  K 4.2 3.6  CL 103 105  CO2 22 23  GLUCOSE 135* 106*  BUN 25* 27*  CREATININE 1.54* 1.18  CALCIUM 8.2* 8.3*     Studies/Results: DG C-Arm 1-60 Min-No Report  Result Date: 03/22/2023 Fluoroscopy was utilized by the requesting physician.  No radiographic interpretation.      LOS: 1 day    Cathren Harsh, MD [facility]  Consult discussed with Dr. Liliane Shi  03/24/2023, 9:18 AM

## 2023-03-24 NOTE — Plan of Care (Signed)
  Problem: Health Behavior/Discharge Planning: Goal: Ability to manage health-related needs will improve Outcome: Progressing   Problem: Clinical Measurements: Goal: Ability to maintain clinical measurements within normal limits will improve Outcome: Progressing Goal: Will remain free from infection Outcome: Progressing Goal: Diagnostic test results will improve Outcome: Progressing Goal: Respiratory complications will improve Outcome: Progressing Goal: Cardiovascular complication will be avoided Outcome: Progressing   Problem: Coping: Goal: Level of anxiety will decrease Outcome: Progressing   Problem: Elimination: Goal: Will not experience complications related to bowel motility Outcome: Progressing Goal: Will not experience complications related to urinary retention Outcome: Progressing   Problem: Pain Managment: Goal: General experience of comfort will improve Outcome: Progressing   Problem: Safety: Goal: Ability to remain free from injury will improve Outcome: Progressing   Problem: Skin Integrity: Goal: Risk for impaired skin integrity will decrease Outcome: Progressing   

## 2023-03-24 NOTE — Consult Note (Addendum)
Cardiology Consultation:   Patient ID: Kevin Doyle; [id number]; 1950/09/15   Admit date: 03/22/2023 Date of Consult: 03/24/2023  Primary Care Provider: Patient, No Pcp Per Primary Cardiologist: Rollene Rotunda, MD  Primary Electrophysiologist:  Michele Rockers, MD   Patient Profile:   Kevin Doyle is a 72 y.o. male with a hx of PAF who is being seen today for the evaluation of atrial fib at the request of Dr. Ashley Royalty.  History of Present Illness:   I initially saw this patient in 2015 for A-fib.  At that time it was felt this was an isolated incident related to vertigo.  The patient presented to the ED on 03/07/2023 with palpitations.  He was found to have be in A-fib with RVR and started on Cardizem drip.  He was admitted for further evaluation.  He underwent TEE/DCCV with return of A-fib and he was started on amiodarone.  He underwent another DCCV and was successfully converted to NSR.  He was discharged on 03/12/2023 in NSR on amiodarone, metoprolol, Eliquis.  He contacted our office on 03/17/2023 with complaints of palpitations.  He was continued to continue amio 400 mg BID.  He took additional beta blocker.   He is ultimately being referred to EP for consideration of ablation. He completed an amio load and is on 200 mg PO daily now.   He was admitted with left flank pain and found to have an obstructive UPJ stone and is now status post stenting.  He had his Eliquis held but has been cleared to restart. He is in atrial fib with rapid rate.  He will need laser lithotripsy.      We are going asked today to see the patient to help with his atrial fib with RVR.  Requesting physician is Dr. Ashley Royalty.  He said he thought he was doing well at home not having any paroxysms.  EKG done on 9 3 is not released in the system I am unable to find it.  The admission note says he was in sinus rhythm.  It looks like he popped into fibrillation on 9/5 at 1623 and has been in persistent atrial fibrillation  since then.  He is not really feeling this although he does typically.  Rates have been above 100 sometimes in the 120s.  He is not having any chest pressure, neck or arm discomfort.  He is not having any new shortness of breath, PND or orthopnea.   Past Medical History:  Diagnosis Date   Atrial fibrillation (HCC)    last episode was 02/10/13   GERD (gastroesophageal reflux disease)    Gilbert's disease     Past Surgical History:  Procedure Laterality Date   CARDIOVERSION N/A 03/09/2023   Procedure: CARDIOVERSION;  Surgeon: Christell Constant, MD;  Location: [facility];  Service: Cardiovascular;  Laterality: N/A;   CARDIOVERSION N/A 03/12/2023   Procedure: CARDIOVERSION;  Surgeon: Jake Bathe, MD;  Location: [facility];  Service: Cardiovascular;  Laterality: N/A;   CHOLECYSTECTOMY  05/18/2012   Procedure: LAPAROSCOPIC CHOLECYSTECTOMY WITH INTRAOPERATIVE CHOLANGIOGRAM;  Surgeon: Valarie Merino, MD;  Location: [facility];  Service: General;  Laterality: N/A;   COLONOSCOPY  10/08/2019   2005, 2010   CYSTOSCOPY/URETEROSCOPY/HOLMIUM LASER/STENT PLACEMENT Bilateral 03/22/2023   Procedure: BILATERAL CYSTOSCOPY RETROGRADE, RIGHT URETEROSCOPY WITH HOLMIUM LASER, BILATERAL STENT PLACEMENT;  Surgeon: Milderd Meager., MD;  Location: [facility];  Service: Urology;  Laterality: Bilateral;   ERCP  07/11/2012   Procedure:  ENDOSCOPIC RETROGRADE CHOLANGIOPANCREATOGRAPHY (ERCP);  Surgeon: Louis Meckel, MD;  Location: [facility];  Service: Endoscopy;  Laterality: N/A;   Hernia repairs     x 3    INGUINAL HERNIA REPAIR     bilateral   LAPAROSCOPIC LYSIS OF ADHESIONS  06/24/2012   Procedure: LAPAROSCOPIC LYSIS OF ADHESIONS;  Surgeon: Crecencio Mc, MD;  Location: [facility];  Service: Urology;;   ROBOTIC ASSITED PARTIAL NEPHRECTOMY  06/24/2012   Procedure: ROBOTIC ASSITED PARTIAL NEPHRECTOMY;  Surgeon: Crecencio Mc, MD;  Location: [facility];  Service: Urology;  Laterality: Right;   TEE WITHOUT  CARDIOVERSION N/A 03/09/2023   Procedure: TRANSESOPHAGEAL ECHOCARDIOGRAM;  Surgeon: Christell Constant, MD;  Location: [facility];  Service: Cardiovascular;  Laterality: N/A;   UPPER GASTROINTESTINAL ENDOSCOPY  10/08/2019   2005,2010,2013     Home Medications:  Prior to Admission medications   Medication Sig Start Date End Date Taking? Authorizing Provider  amiodarone (PACERONE) 200 MG tablet Take 1 tablet (200 mg total) by mouth daily. 03/20/23  Yes Wittenborn, Gavin Pound, NP  apixaban (ELIQUIS) 5 MG TABS tablet Take 1 tablet (5 mg total) by mouth 2 (two) times daily. 03/12/23 04/11/23 Yes Pahwani, Daleen Bo, MD  metoprolol tartrate (LOPRESSOR) 100 MG tablet Take 0.5 tablets (50 mg total) by mouth every morning AND 1 tablet (100 mg total) every evening. 03/20/23 06/18/23 Yes Carlos Levering, NP    Inpatient Medications: Scheduled Meds:  amiodarone  200 mg Oral Daily   cefdinir  300 mg Oral Q12H   diltiazem  10 mg Intravenous Once   metoprolol tartrate  50 mg Oral Daily   And   metoprolol tartrate  100 mg Oral QHS   tamsulosin  0.4 mg Oral QPC supper   Continuous Infusions:  sodium chloride 75 mL/hr at 03/24/23 0642   PRN Meds: acetaminophen **OR** acetaminophen, HYDROmorphone (DILAUDID) injection, metoprolol tartrate, ondansetron **OR** ondansetron (ZOFRAN) IV, mouth rinse, oxyCODONE, phenazopyridine, zolpidem  Allergies:   No Known Allergies  Social History:   Social History   Socioeconomic History   Marital status: Married    Spouse name: Not on file   Number of children: 2   Years of education: Not on file   Highest education level: Not on file  Occupational History   Not on file  Tobacco Use   Smoking status: Former   Smokeless tobacco: Never   Tobacco comments:    Quit at age 72   Vaping Use   Vaping status: Never Used  Substance and Sexual Activity   Alcohol use: Yes    Comment: Rare-Beer    Drug use: No   Sexual activity: Not on file  Other Topics  Concern   Not on file  Social History Narrative   Married.  Independent of ADLs.  2 cups of coffee daily       Partial right nephrectomy about 10 years ago   Social Determinants of Health   Financial Resource Strain: Not on file  Food Insecurity: No Food Insecurity (03/22/2023)   Hunger Vital Sign    Worried About Running Out of Food in the Last Year: Never true    Ran Out of Food in the Last Year: Never true  Transportation Needs: No Transportation Needs (03/22/2023)   PRAPARE - Administrator, Civil Service (Medical): No    Lack of Transportation (Non-Medical): No  Physical Activity: Not on file  Stress: Not on file  Social Connections: Unknown (11/29/2021)   [facility]  Social Network    Social Network: Not on file  Intimate Partner Violence: Not At Risk (03/22/2023)   Humiliation, Afraid, Rape, and Kick questionnaire    Fear of Current or Ex-Partner: No    Emotionally Abused: No    Physically Abused: No    Sexually Abused: No    Family History:    Family History  Problem Relation Age of Onset   Lung cancer Mother    Atrial fibrillation Mother    Brain cancer Father    Atrial fibrillation Brother    Colon cancer Neg Hx    Rectal cancer Neg Hx    Stomach cancer Neg Hx    Esophageal cancer Neg Hx      ROS:  Please see the history of present illness.   All other ROS reviewed and negative.     Physical Exam/Data:   Vitals:   03/23/23 2310 03/24/23 0226 03/24/23 0750 03/24/23 0901  BP:  120/72 121/73 110/61  Pulse: (!) 123 (!) 126 (!) 125 (!) 142  Resp:  16 20 17   Temp:  98.7 F (37.1 C) 98.3 F (36.8 C) 98.3 F (36.8 C)  TempSrc:  Oral Oral Oral  SpO2:  96% 96% 96%  Weight:      Height:        Intake/Output Summary (Last 24 hours) at 03/24/2023 1220 Last data filed at 03/24/2023 0900 Gross per 24 hour  Intake 1219.51 ml  Output 1550 ml  Net -330.49 ml   Filed Weights   03/22/23 0528 03/22/23 1244  Weight: 90.7 kg 90.7 kg    Body mass index is 25.68 kg/m.  GENERAL:  Wellappearing HEENT:   Pupils equal round and reactive, fundi not visualized, oral mucosa unremarkable NECK:  No  jugular venous distention, waveform within normal limits, carotid upstroke brisk and symmetric, no bruits, no thyromegaly LYMPHATICS:  No cervical, inguinal adenopathy LUNGS:   Clear to auscultation bilaterally BACK:  No CVA tenderness CHEST:   Unremarkable HEART:  PMI not displaced or sustained,S1 and S2 within normal limits, no S3, no clicks, no rubs, no murmurs, irregular ABD:  Flat, positive bowel sounds normal in frequency in pitch, no bruits, no rebound, no guarding, no midline pulsatile mass, no hepatomegaly, no splenomegaly EXT:  2 plus pulses throughout, no  edema, no cyanosis no clubbing SKIN:  No rashes no nodules NEURO:   Cranial nerves II through XII grossly intact, motor grossly intact throughout PSYCH:    Cognitively intact, oriented to person place and time   EKG:  The EKG was personally reviewed and demonstrates:  NA Telemetry:  Telemetry was personally reviewed and demonstrates:  Atrial fib with RVR  Relevant CV Studies: NA  Laboratory Data:  Chemistry Recent Labs  Lab 03/22/23 0523 03/23/23 0513 03/24/23 0507  NA 136 134* 137  K 4.0 4.2 3.6  CL 104 103 105  CO2 20* 22 23  GLUCOSE 123* 135* 106*  BUN 29* 25* 27*  CREATININE 1.90* 1.54* 1.18  CALCIUM 8.7* 8.2* 8.3*  GFRNONAA 37* 48* >60  ANIONGAP 12 9 9     Recent Labs  Lab 03/22/23 0523 03/23/23 0513 03/24/23 0507  PROT 7.0 5.7* 5.5*  ALBUMIN 3.2* 2.5* 2.4*  AST 16 12* 14*  ALT 21 15 18   ALKPHOS 92 80 72  BILITOT 1.4* 1.3* 0.8   Hematology Recent Labs  Lab 03/22/23 0523 03/23/23 0513 03/24/23 0507  WBC 12.4* 10.3 10.4  RBC 3.66* 3.61* 3.42*  HGB 11.1* 10.7* 10.2*  HCT  33.5* 32.3* 31.5*  MCV 91.5 89.5 92.1  MCH 30.3 29.6 29.8  MCHC 33.1 33.1 32.4  RDW 12.3 12.3 12.5  PLT 420* 378 384   Cardiac EnzymesNo results for input(s):  "TROPONINI" in the last 168 hours. No results for input(s): "TROPIPOC" in the last 168 hours.  BNPNo results for input(s): "BNP", "PROBNP" in the last 168 hours.  DDimer No results for input(s): "DDIMER" in the last 168 hours.  Radiology/Studies:  DG C-Arm 1-60 Min-No Report  Result Date: 03/22/2023 Fluoroscopy was utilized by the requesting physician.  No radiographic interpretation.   CT Renal Stone Study  Result Date: 03/22/2023 CLINICAL DATA:  Abdominal/flank pain. Stone suspected with left flank pain. EXAM: CT ABDOMEN AND PELVIS WITHOUT CONTRAST TECHNIQUE: Multidetector CT imaging of the abdomen and pelvis was performed following the standard protocol without IV contrast. RADIATION DOSE REDUCTION: This exam was performed according to the departmental dose-optimization program which includes automated exposure control, adjustment of the mA and/or kV according to patient size and/or use of iterative reconstruction technique. COMPARISON:  CT 09/17/2019 FINDINGS: Lower chest: Small left pleural effusion. Compressive atelectasis in left lower lobe. Small dependent densities in the posterior right lower lobe. At least 2 small nodular densities near the right lung base largest measuring 4 mm on image 11/10. Small to moderate amount of pericardial fluid. Pericardial fluid is new since 2021 but was noted on recent transesophageal echocardiogram from 03/09/2023. Hepatobiliary: Cholecystectomy. Stable calcification near the liver capsule in the anterior liver on image 28/2. Mild dilatation of the extrahepatic biliary system appears similar to the previous examination. Pancreas: Unremarkable. No pancreatic ductal dilatation or surrounding inflammatory changes. Spleen: Normal in size without focal abnormality. Adrenals/Urinary Tract: Normal adrenal glands. Irregularity along the lateral aspect of the right kidney is likely associated with scarring. No definite right renal calculi. No right hydronephrosis. However,  there is a 9 mm stone in the distal right ureter. Left perinephric stranding. Moderate to severe left hydronephrosis. Two stones within the left kidney largest measures 4 mm in the interpolar region. Large obstructing stone at the left ureteropelvic junction that measures up to 1.0 cm. Stranding along the left retroperitoneal along the length of the left ureter. Normal appearance of the urinary bladder. Stomach/Bowel: Normal appearance of the stomach. Few dilated loops of small bowel in the anterior abdomen on image 53/2. Small bowel measures up to 4.6 cm. No evidence for focal bowel inflammation. Vascular/Lymphatic: Vascular structures are unremarkable. Again noted are prominent central mesenteric lymph nodes best seen on image 55/2. These prominent mesenteric lymph nodes appear to be chronic. Reproductive: Prostate is unremarkable. Other: Small amount of free fluid in the pelvis. Negative for free air. Small left inguinal hernia containing fat. Small umbilical hernia containing fat. Musculoskeletal: No acute bone abnormality. IMPRESSION: 1. Moderate to severe left hydronephrosis secondary to a 1 cm obstructing stone at the left ureteropelvic junction. Extensive left perinephric and retroperitoneal stranding associated with the urinary obstruction. Additional small left renal calculi. 2. Nonobstructive 9 mm stone in the distal right ureter. Chronic cortical scarring involving the right kidney. 3. Dilated loops of small bowel in the anterior abdomen. Findings are nonspecific. Focal ileus or partial bowel obstruction cannot be excluded. Small amount of free fluid in the pelvis. 4. Small left pleural effusion and pericardial effusion. Please note that the pericardial effusion was noted on the echocardiogram from 03/09/2023. 5. Prominent lymph nodes in the central aspect of the mesentery. These findings appear to be chronic. 6. Small nodular densities  at the right lung base, largest measures 4 mm. No follow-up needed  if patient is low-risk. Non-contrast chest CT can be considered in 12 months if patient is high-risk. This recommendation follows the consensus statement: Guidelines for Management of Incidental Pulmonary Nodules Detected on CT Images: From the Fleischner Society 2017; Radiology 2017; 284:228-243. Electronically Signed   By: Richarda Overlie M.D.   On: 03/22/2023 08:53    Assessment and Plan:   PAF.  He is due to see Dr. Jimmey Ralph later this month.  We are going to need to work out the timing of ablation.  He has now had his Eliquis interrupted and should resume today.  At this point I am going to pursue rate control.  I do think he can continue his previous amiodarone.  He has missed a few days worth of Eliquis but the risk of rapid rate I think he is significant so I am going to continue the amiodarone in part for rate control.  Restarting his Eliquis today will mean he had a very limited amount of time in fibrillation without any anticoagulation.  I will also increase his metoprolol to 100 mg twice daily.   He is currently on digoxin as well.  He is going to get a wearable and can let me know what his heart rate does at home.  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 This is going to be reassessed when we have rhythm management.  For now I am going to manage this with the beta-blocker and will consider ARNI in the future as dictated by his ejection fraction.  He has no symptoms.  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   For questions or updates, please contact [facility] Please consult [email or web address] for contact info under Cardiology/STEMI.   Signed, Rollene Rotunda, MD  03/24/2023 12:20 PM

## 2023-03-25 ENCOUNTER — Inpatient Hospital Stay (HOSPITAL_COMMUNITY): Payer: Medicare Other

## 2023-03-25 DIAGNOSIS — N1330 Unspecified hydronephrosis: Secondary | ICD-10-CM | POA: Diagnosis not present

## 2023-03-25 DIAGNOSIS — I5020 Unspecified systolic (congestive) heart failure: Secondary | ICD-10-CM | POA: Diagnosis not present

## 2023-03-25 DIAGNOSIS — I480 Paroxysmal atrial fibrillation: Secondary | ICD-10-CM | POA: Diagnosis not present

## 2023-03-25 LAB — CBC
HCT: 32.3 % — ABNORMAL LOW (ref 39.0–52.0)
Hemoglobin: 10.2 g/dL — ABNORMAL LOW (ref 13.0–17.0)
MCH: 29.3 pg (ref 26.0–34.0)
MCHC: 31.6 g/dL (ref 30.0–36.0)
MCV: 92.8 fL (ref 80.0–100.0)
Platelets: 418 10*3/uL — ABNORMAL HIGH (ref 150–400)
RBC: 3.48 MIL/uL — ABNORMAL LOW (ref 4.22–5.81)
RDW: 12.7 % (ref 11.5–15.5)
WBC: 9.4 10*3/uL (ref 4.0–10.5)
nRBC: 0 % (ref 0.0–0.2)

## 2023-03-25 LAB — BASIC METABOLIC PANEL
Anion gap: 9 (ref 5–15)
BUN: 26 mg/dL — ABNORMAL HIGH (ref 8–23)
CO2: 23 mmol/L (ref 22–32)
Calcium: 8.3 mg/dL — ABNORMAL LOW (ref 8.9–10.3)
Chloride: 106 mmol/L (ref 98–111)
Creatinine, Ser: 1.26 mg/dL — ABNORMAL HIGH (ref 0.61–1.24)
GFR, Estimated: >60 mL/min (ref >60)
Glucose, Bld: 109 mg/dL — ABNORMAL HIGH (ref 70–99)
Potassium: 4 mmol/L (ref 3.5–5.1)
Sodium: 138 mmol/L (ref 135–145)

## 2023-03-25 MED ORDER — CEFDINIR 300 MG PO CAPS: 300.0000 mg | ORAL_CAPSULE | Freq: Two times a day (BID) | ORAL | 0 refills | Status: DC

## 2023-03-25 MED ORDER — TAMSULOSIN HCL 0.4 MG PO CAPS: 0.4000 mg | ORAL_CAPSULE | Freq: Every day | ORAL | 2 refills | Status: DC

## 2023-03-25 MED ORDER — DIGOXIN 250 MCG PO TABS: 0.2500 mg | ORAL_TABLET | Freq: Every day | ORAL | 2 refills | Status: DC

## 2023-03-25 MED ORDER — OXYCODONE HCL 5 MG PO TABS: 5.0000 mg | ORAL_TABLET | Freq: Four times a day (QID) | ORAL | 0 refills | Status: DC | PRN

## 2023-03-25 MED ORDER — METOPROLOL TARTRATE 100 MG PO TABS: 100.0000 mg | ORAL_TABLET | Freq: Two times a day (BID) | ORAL | 2 refills | Status: DC

## 2023-03-25 NOTE — Discharge Instructions (Signed)
Patient given list of [facility] Primary Care Providers 

## 2023-03-25 NOTE — Discharge Summary (Signed)
Physician Discharge Summary  Diland Novosad OZH:[id number] DOB: June 04, 1951 DOA: 03/22/2023  PCP: Patient, No Pcp Per  Admit date: 03/22/2023 Discharge date: 03/25/2023  Admitted From: home Disposition:  home  Recommendations for Outpatient Follow-up:  Follow up with PCP in 1-2 weeks Please obtain BMP/CBC in one week Follow up with cardiology  Home Health:none Equipment/Devices:none Discharge Condition:stable CODE STATUS:full Diet recommendation: cardiac Brief/Interim Summary: 72 year old male admitted with left flank pain found to have 1 cm obstructing stone in the left UPJ with severe left hydronephrosis status post retrograde cystoscopy with bilateral ureteral urolith removal and bilateral stent placement.  Past medical history is complex for right renal mass, small bowel obstruction, BPH, atrial fibrillation, kidney stones, Gilbert disease.  Patient has been on Eliquis prior to admission and continues to have gross hematuria, Eliquis being held.    CT renal stone study showing moderate to severe left hydronephrosis secondary to a 1 cm obstructing stone at the left UPJ.  Extensive left perinephric and retroperitoneal stranding associated with the urinary obstruction.  Additional small left renal calculi.  Nonobstructive 9 mm stone in the distal right ureter.  Chronic cortical scarring involving the right kidney.  Dilated loops of small bowel in the anterior abdomen.  Focal ileus or partial SBO cannot be excluded.  Small amount of free fluid in the pelvis.  Small left pleural effusion and pericardial effusion.  Prominent lymph nodes in the mesentery which appear to be chronic.  Small nodular densities at the right lung base, largest measured 4 mm.  No follow-up needed for patient is low risk.  Noncontrast chest CT in 12 months if the patient is high risk.   Discharge Diagnoses:  Principal Problem:   Hydronephrosis of left kidney Active Problems:   Normocytic anemia   Elevated LFTs   Atrial  fibrillation (HCC)   BPH (benign prostatic hyperplasia)   Gilbert's syndrome   AKI (acute kidney injury) (HCC)   Hypoalbuminemia   AKI resolved after  stenting  due to kidney stones with   Hydronephrosis of left kidney S/P retrograde cystoscopy/laser lithotripsy and bilateral stent placement.renal functions improved to 1.26 from cr 1.18 from 1.54 from 1.90 Eliquis was restarted on 03/24/2023.  This was on hold for a day and a half due to gross hematuria.  His hemoglobin remained stable.  Urology recommends   Laser lithotripsy on the  left side ureteral stone as an outpatient and right stent to be removed in 4 to 5 days.   Gross hematuria- improved, urine still dark but hemoglobin stable.    Atrial fibrillation -continue amiodarone, metoprolol, Eliquis.  Digoxin was also started during this hospital stay.  He was seen in consultation by cardiology.   BPH-on flomax follow-up with urology   Gilbert's syndrome-  stable   Estimated body mass index is 25.68 kg/m as calculated from the following:   Height as of this encounter: 6\' 2"  (1.88 m).   Weight as of this encounter: 90.7 kg.  Discharge Instructions  Discharge Instructions     Call MD for:  difficulty breathing, headache or visual disturbances   Complete by: As directed    Call MD for:  persistant nausea and vomiting   Complete by: As directed    Diet - low sodium heart healthy   Complete by: As directed    Increase activity slowly   Complete by: As directed       Allergies as of 03/25/2023   No Known Allergies      Medication List  TAKE these medications    amiodarone 200 MG tablet Commonly known as: PACERONE Take 1 tablet (200 mg total) by mouth daily.   cefdinir 300 MG capsule Commonly known as: OMNICEF Take 1 capsule (300 mg total) by mouth 2 (two) times daily.   digoxin 0.25 MG tablet Commonly known as: LANOXIN Take 1 tablet (0.25 mg total) by mouth daily.   Eliquis 5 MG Tabs tablet Generic drug:  apixaban Take 1 tablet (5 mg total) by mouth 2 (two) times daily.   metoprolol tartrate 100 MG tablet Commonly known as: LOPRESSOR Take 1 tablet (100 mg total) by mouth 2 (two) times daily. What changed: See the new instructions.   oxyCODONE 5 MG immediate release tablet Commonly known as: Oxy IR/ROXICODONE Take 1 tablet (5 mg total) by mouth every 6 (six) hours as needed for breakthrough pain or moderate pain.   tamsulosin 0.4 MG Caps capsule Commonly known as: FLOMAX Take 1 capsule (0.4 mg total) by mouth daily after supper.        Follow-up Information     Stoneking, Danford Bad., MD Follow up in 1 week(s).   Specialty: Urology Contact information: [address] [place] [address] [phone]                No Known Allergies  Consultations: Cardiology and urology   Procedures/Studies: DG C-Arm 1-60 Min-No Report  Result Date: 03/25/2023 Fluoroscopy was utilized by the requesting physician.  No radiographic interpretation.   DG C-Arm 1-60 Min-No Report  Result Date: 03/22/2023 Fluoroscopy was utilized by the requesting physician.  No radiographic interpretation.   CT Renal Stone Study  Result Date: 03/22/2023 CLINICAL DATA:  Abdominal/flank pain. Stone suspected with left flank pain. EXAM: CT ABDOMEN AND PELVIS WITHOUT CONTRAST TECHNIQUE: Multidetector CT imaging of the abdomen and pelvis was performed following the standard protocol without IV contrast. RADIATION DOSE REDUCTION: This exam was performed according to the departmental dose-optimization program which includes automated exposure control, adjustment of the mA and/or kV according to patient size and/or use of iterative reconstruction technique. COMPARISON:  CT 09/17/2019 FINDINGS: Lower chest: Small left pleural effusion. Compressive atelectasis in left lower lobe. Small dependent densities in the posterior right lower lobe. At least 2 small nodular densities near the right lung base  largest measuring 4 mm on image 11/10. Small to moderate amount of pericardial fluid. Pericardial fluid is new since 2021 but was noted on recent transesophageal echocardiogram from 03/09/2023. Hepatobiliary: Cholecystectomy. Stable calcification near the liver capsule in the anterior liver on image 28/2. Mild dilatation of the extrahepatic biliary system appears similar to the previous examination. Pancreas: Unremarkable. No pancreatic ductal dilatation or surrounding inflammatory changes. Spleen: Normal in size without focal abnormality. Adrenals/Urinary Tract: Normal adrenal glands. Irregularity along the lateral aspect of the right kidney is likely associated with scarring. No definite right renal calculi. No right hydronephrosis. However, there is a 9 mm stone in the distal right ureter. Left perinephric stranding. Moderate to severe left hydronephrosis. Two stones within the left kidney largest measures 4 mm in the interpolar region. Large obstructing stone at the left ureteropelvic junction that measures up to 1.0 cm. Stranding along the left retroperitoneal along the length of the left ureter. Normal appearance of the urinary bladder. Stomach/Bowel: Normal appearance of the stomach. Few dilated loops of small bowel in the anterior abdomen on image 53/2. Small bowel measures up to 4.6 cm. No evidence for focal bowel inflammation. Vascular/Lymphatic: Vascular structures are unremarkable.  Again noted are prominent central mesenteric lymph nodes best seen on image 55/2. These prominent mesenteric lymph nodes appear to be chronic. Reproductive: Prostate is unremarkable. Other: Small amount of free fluid in the pelvis. Negative for free air. Small left inguinal hernia containing fat. Small umbilical hernia containing fat. Musculoskeletal: No acute bone abnormality. IMPRESSION: 1. Moderate to severe left hydronephrosis secondary to a 1 cm obstructing stone at the left ureteropelvic junction. Extensive left  perinephric and retroperitoneal stranding associated with the urinary obstruction. Additional small left renal calculi. 2. Nonobstructive 9 mm stone in the distal right ureter. Chronic cortical scarring involving the right kidney. 3. Dilated loops of small bowel in the anterior abdomen. Findings are nonspecific. Focal ileus or partial bowel obstruction cannot be excluded. Small amount of free fluid in the pelvis. 4. Small left pleural effusion and pericardial effusion. Please note that the pericardial effusion was noted on the echocardiogram from 03/09/2023. 5. Prominent lymph nodes in the central aspect of the mesentery. These findings appear to be chronic. 6. Small nodular densities at the right lung base, largest measures 4 mm. No follow-up needed if patient is low-risk. Non-contrast chest CT can be considered in 12 months if patient is high-risk. This recommendation follows the consensus statement: Guidelines for Management of Incidental Pulmonary Nodules Detected on CT Images: From the Fleischner Society 2017; Radiology 2017; 284:228-243. Electronically Signed   By: Richarda Overlie M.D.   On: 03/22/2023 08:53   EP STUDY  Result Date: 03/12/2023 See surgical note for result.  DG CHEST PORT 1 VIEW  Result Date: 03/11/2023 CLINICAL DATA:  CHF EXAM: PORTABLE CHEST 1 VIEW COMPARISON:  03/09/2023 FINDINGS: Mild cardiomegaly. Small, layering bilateral pleural effusions. No acute appearing airspace opacity. The visualized skeletal structures are unremarkable. IMPRESSION: Mild cardiomegaly. Small, layering bilateral pleural effusions. No acute appearing airspace opacity. Electronically Signed   By: Jearld Lesch M.D.   On: 03/11/2023 17:27   DG CHEST PORT 1 VIEW  Result Date: 03/09/2023 CLINICAL DATA:  Hypoxia. EXAM: PORTABLE CHEST 1 VIEW COMPARISON:  03/07/2023 FINDINGS: Heart size upper normal. Stable mediastinal contours. No interstitial and septal thickening suspicious for pulmonary edema. There are small  bilateral pleural effusions. No pneumothorax or confluent airspace disease. IMPRESSION: Borderline cardiomegaly. New pulmonary edema and small bilateral pleural effusions. Electronically Signed   By: Narda Rutherford M.D.   On: 03/09/2023 19:25   ECHO TEE  Result Date: 03/09/2023    TRANSESOPHOGEAL ECHO REPORT   Patient Name:   Kevin Doyle [facility] Date of Exam: 03/09/2023 Medical Rec #:  [id number]        Height:       74.0 in Accession #:    [id number]       Weight:       208.1 lb Date of Birth:  1951-01-21        BSA:          2.210 m Patient Age:    71 years         BP:           111/72 mmHg Patient Gender: M                HR:           132 bpm. Exam Location:  Inpatient Procedure: Transesophageal Echo, Color Doppler, Cardiac Doppler and 3D Echo Indications:     Afib  History:         Patient has prior history of Echocardiogram examinations, most  recent 03/08/2023. Arrythmias:Atrial Fibrillation.  Sonographer:     Milbert Coulter Referring Phys:  [id number] Jonita Albee Diagnosing Phys: Riley Lam MD PROCEDURE: After discussion of the risks and benefits of a TEE, an informed consent was obtained from the patient. The transesophogeal probe was passed without difficulty through the esophogus of the patient. Imaged were obtained with the patient in a left lateral decubitus position. Local oropharyngeal anesthetic was provided with viscous lidocaine. Sedation performed by different physician. The patient was monitored while under deep sedation. Anesthestetic sedation was provided intravenously by Anesthesiology: 190mg  of Propofol, 100mg  of Lidocaine. Image quality was good. The patient's vital signs; including heart rate, blood pressure, and oxygen saturation; remained stable throughout the procedure. The patient developed no complications during  the procedure. An unsuccessful direct current cardioversion was performed at 200 joules with 2 attempts.  IMPRESSIONS  1. Left ventricular  ejection fraction, by estimation, is 45 to 50%. The left ventricle has mildly decreased function. There is severe left ventricular hypertrophy.  2. Right ventricular systolic function is low normal. The right ventricular size is mildly enlarged.  3. Left atrial size was mildly dilated. No left atrial/left atrial appendage thrombus was detected.  4. Right atrial size was mildly dilated.  5. A small pericardial effusion is present. The pericardial effusion is localized near the right atrium.  6. The mitral valve is normal in structure. Mild to moderate mitral valve regurgitation. No evidence of mitral stenosis.  7. The aortic valve is tricuspid. Aortic valve regurgitation is not visualized. No aortic stenosis is present.  8. Mildly dilated pulmonary artery.  9. The inferior vena cava is normal in size with greater than 50% respiratory variability, suggesting right atrial pressure of 3 mmHg. FINDINGS  Left Ventricle: Left ventricular ejection fraction, by estimation, is 45 to 50%. The left ventricle has mildly decreased function. The left ventricular internal cavity size was normal in size. There is severe left ventricular hypertrophy. Right Ventricle: The right ventricular size is mildly enlarged. No increase in right ventricular wall thickness. Right ventricular systolic function is low normal. Left Atrium: Left atrial size was mildly dilated. No left atrial/left atrial appendage thrombus was detected. Right Atrium: Right atrial size was mildly dilated. Pericardium: A small pericardial effusion is present. The pericardial effusion is localized near the right atrium. Mitral Valve: Atrial functional mitral regurgitation. The mitral valve is normal in structure. Mild to moderate mitral valve regurgitation. No evidence of mitral valve stenosis. Tricuspid Valve: The tricuspid valve is normal in structure. Tricuspid valve regurgitation is trivial. No evidence of tricuspid stenosis. Aortic Valve: The aortic valve is  tricuspid. Aortic valve regurgitation is not visualized. No aortic stenosis is present. Pulmonic Valve: The pulmonic valve was normal in structure. Pulmonic valve regurgitation is trivial. Aorta: The aortic root, ascending aorta, aortic arch and descending aorta are all structurally normal, with no evidence of dilitation or obstruction. Pulmonary Artery: The pulmonary artery is mildly dilated. Venous: The inferior vena cava is normal in size with greater than 50% respiratory variability, suggesting right atrial pressure of 3 mmHg. IAS/Shunts: No atrial level shunt detected by color flow Doppler. Additional Comments: Spectral Doppler performed.  AORTA Ao Asc diam: 2.90 cm Riley Lam MD Electronically signed by Riley Lam MD Signature Date/Time: 03/09/2023/3:33:13 PM    Final    EP STUDY  Result Date: 03/09/2023 See surgical note for result.  ECHOCARDIOGRAM COMPLETE  Result Date: 03/08/2023    ECHOCARDIOGRAM REPORT   Patient Name:   KUBA ANDOLINA [facility]  Date of Exam: 03/08/2023 Medical Rec #:  [id number]        Height:       74.0 in Accession #:    [id number]       Weight:       208.1 lb Date of Birth:  [birth year]        BSA:          2.210 m Patient Age:    71 years         BP:           111/68 mmHg Patient Gender: M                HR:           102 bpm. Exam Location:  Inpatient Procedure: 2D Echo, Cardiac Doppler and Color Doppler Indications:    I48.91* Unspeicified atrial fibrillation  History:        Patient has no prior history of Echocardiogram examinations.                 Abnormal ECG, Arrythmias:Atrial Fibrillation;                 Signs/Symptoms:Dizziness/Lightheadedness, Dyspnea and Shortness                 of Breath. Pneumonia.  Sonographer:    Sheralyn Boatman RDCS Referring Phys: [id number] [employer] GOEL IMPRESSIONS  1. Left ventricular ejection fraction, by estimation, is 45 to 50%. The left ventricle has mildly decreased function. The left ventricle demonstrates global hypokinesis. The left  ventricular internal cavity size was mildly to moderately dilated. Left ventricular diastolic function could not be evaluated.  2. Right ventricular systolic function is normal. The right ventricular size is normal. There is moderately elevated pulmonary artery systolic pressure.  3. The mitral valve is grossly normal. Mild to moderate mitral valve regurgitation. No evidence of mitral stenosis.  4. The aortic valve is tricuspid. There is mild calcification of the aortic valve. Aortic valve regurgitation is not visualized. No aortic stenosis is present.  5. The inferior vena cava is dilated in size with <50% respiratory variability, suggesting right atrial pressure of 15 mmHg. Comparison(s): No prior Echocardiogram. Conclusion(s)/Recommendation(s): Tachycardia throughout study limits sensitivity for detection of focal wall motion abnormalities. FINDINGS  Left Ventricle: Left ventricular ejection fraction, by estimation, is 45 to 50%. The left ventricle has mildly decreased function. The left ventricle demonstrates global hypokinesis. The left ventricular internal cavity size was mildly to moderately dilated. There is borderline left ventricular hypertrophy. Left ventricular diastolic function could not be evaluated due to atrial fibrillation. Left ventricular diastolic function could not be evaluated. Right Ventricle: The right ventricular size is normal. Right vetricular wall thickness was not well visualized. Right ventricular systolic function is normal. There is moderately elevated pulmonary artery systolic pressure. The tricuspid regurgitant velocity is 2.80 m/s, and with an assumed right atrial pressure of 15 mmHg, the estimated right ventricular systolic pressure is 46.4 mmHg. Left Atrium: Left atrial size was normal in size. Right Atrium: Right atrial size was normal in size. Pericardium: Trivial pericardial effusion is present. Mitral Valve: The mitral valve is grossly normal. Mild to moderate mitral valve  regurgitation. No evidence of mitral valve stenosis. Tricuspid Valve: The tricuspid valve is normal in structure. Tricuspid valve regurgitation is mild . No evidence of tricuspid stenosis. Aortic Valve: The aortic valve is tricuspid. There is mild calcification of the aortic valve. Aortic valve regurgitation is not visualized. No aortic stenosis is present.  Pulmonic Valve: The pulmonic valve was not well visualized. Pulmonic valve regurgitation is not visualized. No evidence of pulmonic stenosis. Aorta: The aortic root, ascending aorta and aortic arch are all structurally normal, with no evidence of dilitation or obstruction. Venous: The inferior vena cava is dilated in size with less than 50% respiratory variability, suggesting right atrial pressure of 15 mmHg. IAS/Shunts: The atrial septum is grossly normal.  LEFT VENTRICLE PLAX 2D LVIDd:         5.50 cm LVIDs:         4.40 cm LV PW:         1.10 cm LV IVS:        1.10 cm LVOT diam:     2.60 cm LV SV:         73 LV SV Index:   33 LVOT Area:     5.31 cm  LV Volumes (MOD) LV vol d, MOD A2C: 105.0 ml LV vol d, MOD A4C: 75.4 ml LV vol s, MOD A2C: 56.0 ml LV vol s, MOD A4C: 38.9 ml LV SV MOD A2C:     49.0 ml LV SV MOD A4C:     75.4 ml LV SV MOD BP:      43.9 ml RIGHT VENTRICLE             IVC RV S prime:     12.10 cm/s  IVC diam: 2.50 cm TAPSE (M-mode): 1.9 cm LEFT ATRIUM             Index        RIGHT ATRIUM           Index LA diam:        4.40 cm 1.99 cm/m   RA Area:     15.40 cm LA Vol (A2C):   53.4 ml 24.16 ml/m  RA Volume:   42.50 ml  19.23 ml/m LA Vol (A4C):   65.8 ml 29.77 ml/m LA Biplane Vol: 59.2 ml 26.78 ml/m  AORTIC VALVE LVOT Vmax:   93.60 cm/s LVOT Vmean:  62.600 cm/s LVOT VTI:    0.138 m  AORTA Ao Root diam: 3.50 cm Ao Asc diam:  3.50 cm MITRAL VALVE               TRICUSPID VALVE MV Area (PHT): 3.03 cm    TR Peak grad:   31.4 mmHg MV Decel Time: 250 msec    TR Vmax:        280.00 cm/s MV E velocity: 94.10 cm/s                            SHUNTS                             Systemic VTI:  0.14 m                            Systemic Diam: 2.60 cm Jodelle Red MD Electronically signed by Jodelle Red MD Signature Date/Time: 03/08/2023/5:13:55 PM    Final    DG Chest Port 1 View  Result Date: 03/07/2023 CLINICAL DATA:  Atrial fibrillation. EXAM: PORTABLE CHEST 1 VIEW COMPARISON:  X-ray 02/10/2013 FINDINGS: No consolidation, pneumothorax or effusion. No edema. Normal cardiopericardial silhouette. Overlapping cardiac leads hyperinflation. IMPRESSION: Hyperinflation.  No acute cardiopulmonary disease. Electronically Signed   By: Piedad Climes.D.  On: 03/07/2023 11:46   (Echo, Carotid, EGD, Colonoscopy, ERCP)    Subjective:  Anxious to go home sitting up in bed no new complaints urine still dark Discharge Exam: Vitals:   03/24/23 2359 03/25/23 0552  BP: 122/74 124/85  Pulse: 96 96  Resp: 18 18  Temp: 98.6 F (37 C) 98.2 F (36.8 C)  SpO2: 96% 98%   Vitals:   03/24/23 1441 03/24/23 2021 03/24/23 2359 03/25/23 0552  BP:  122/74 122/74 124/85  Pulse:  (!) 113 96 96  Resp: 20 18 18 18   Temp:  98.7 F (37.1 C) 98.6 F (37 C) 98.2 F (36.8 C)  TempSrc:  Oral Oral Oral  SpO2:  97% 96% 98%  Weight:      Height:        General: Pt is alert, awake, not in acute distress Cardiovascular: RRR, S1/S2 +, no rubs, no gallops Respiratory: CTA bilaterally, no wheezing, no rhonchi Abdominal: Soft, NT, ND, bowel sounds + Extremities: no edema, no cyanosis    The results of significant diagnostics from this hospitalization (including imaging, microbiology, ancillary and laboratory) are listed below for reference.     Microbiology: No results found for this or any previous visit (from the past 240 hour(s)).   Labs: BNP (last 3 results) Recent Labs    03/07/23 1030 03/12/23 0435  BNP 859.5* 431.1*   Basic Metabolic Panel: Recent Labs  Lab 03/22/23 0523 03/23/23 0513 03/24/23 0507 03/25/23 0758 03/25/23 1133   NA 136 134* 137 QUESTIONABLE IDENTIFICATION / INCORRECTLY LABELED SPECIMEN 138  K 4.0 4.2 3.6 QUESTIONABLE IDENTIFICATION / INCORRECTLY LABELED SPECIMEN 4.0  CL 104 103 105 QUESTIONABLE IDENTIFICATION / INCORRECTLY LABELED SPECIMEN 106  CO2 20* 22 23 QUESTIONABLE IDENTIFICATION / INCORRECTLY LABELED SPECIMEN 23  GLUCOSE 123* 135* 106* QUESTIONABLE IDENTIFICATION / INCORRECTLY LABELED SPECIMEN 109*  BUN 29* 25* 27* QUESTIONABLE IDENTIFICATION / INCORRECTLY LABELED SPECIMEN 26*  CREATININE 1.90* 1.54* 1.18 QUESTIONABLE IDENTIFICATION / INCORRECTLY LABELED SPECIMEN 1.26*  CALCIUM 8.7* 8.2* 8.3* QUESTIONABLE IDENTIFICATION / INCORRECTLY LABELED SPECIMEN 8.3*  MG 2.1  --  1.9  --   --   PHOS 3.0  --   --   --   --    Liver Function Tests: Recent Labs  Lab 03/22/23 0523 03/23/23 0513 03/24/23 0507  AST 16 12* 14*  ALT 21 15 18   ALKPHOS 92 80 72  BILITOT 1.4* 1.3* 0.8  PROT 7.0 5.7* 5.5*  ALBUMIN 3.2* 2.5* 2.4*   Recent Labs  Lab 03/22/23 0523  LIPASE 24   No results for input(s): "AMMONIA" in the last 168 hours. CBC: Recent Labs  Lab 03/22/23 0523 03/23/23 0513 03/24/23 0507 03/25/23 0758 03/25/23 1133  WBC 12.4* 10.3 10.4 QUESTIONABLE IDENTIFICATION / INCORRECTLY LABELED SPECIMEN 9.4  NEUTROABS 10.8*  --   --   --   --   HGB 11.1* 10.7* 10.2* QUESTIONABLE IDENTIFICATION / INCORRECTLY LABELED SPECIMEN 10.2*  HCT 33.5* 32.3* 31.5* QUESTIONABLE IDENTIFICATION / INCORRECTLY LABELED SPECIMEN 32.3*  MCV 91.5 89.5 92.1 QUESTIONABLE IDENTIFICATION / INCORRECTLY LABELED SPECIMEN 92.8  PLT 420* 378 384 QUESTIONABLE IDENTIFICATION / INCORRECTLY LABELED SPECIMEN 418*   Cardiac Enzymes: No results for input(s): "CKTOTAL", "CKMB", "CKMBINDEX", "TROPONINI" in the last 168 hours. BNP: Invalid input(s): "POCBNP" CBG: No results for input(s): "GLUCAP" in the last 168 hours. D-Dimer No results for input(s): "DDIMER" in the last 72 hours. Hgb A1c No results for input(s): "HGBA1C" in  the last 72 hours. Lipid Profile No results for input(s): "CHOL", "  HDL", "LDLCALC", "TRIG", "CHOLHDL", "LDLDIRECT" in the last 72 hours. Thyroid function studies No results for input(s): "TSH", "T4TOTAL", "T3FREE", "THYROIDAB" in the last 72 hours.  Invalid input(s): "FREET3" Anemia work up No results for input(s): "VITAMINB12", "FOLATE", "FERRITIN", "TIBC", "IRON", "RETICCTPCT" in the last 72 hours. Urinalysis    Component Value Date/Time   COLORURINE YELLOW 03/22/2023 0523   APPEARANCEUR CLEAR 03/22/2023 0523   LABSPEC 1.017 03/22/2023 0523   PHURINE 5.0 03/22/2023 0523   GLUCOSEU NEGATIVE 03/22/2023 0523   HGBUR MODERATE (A) 03/22/2023 0523   BILIRUBINUR NEGATIVE 03/22/2023 0523   BILIRUBINUR neg 09/25/2013 1422   KETONESUR 5 (A) 03/22/2023 0523   PROTEINUR NEGATIVE 03/22/2023 0523   UROBILINOGEN negative 09/25/2013 1422   UROBILINOGEN 4.0 (H) 07/10/2012 0428   NITRITE NEGATIVE 03/22/2023 0523   LEUKOCYTESUR NEGATIVE 03/22/2023 0523   Sepsis Labs Recent Labs  Lab 03/23/23 0513 03/24/23 0507 03/25/23 0758 03/25/23 1133  WBC 10.3 10.4 QUESTIONABLE IDENTIFICATION / INCORRECTLY LABELED SPECIMEN 9.4   Microbiology No results found for this or any previous visit (from the past 240 hour(s)).   Time coordinating discharge: 38 minutes  SIGNED: Alwyn Ren, MD  [facility] 03/25/2023, 1:42 PM

## 2023-03-25 NOTE — Progress Notes (Signed)
   3 Days Post-Op Subjective: Patient restarted eliquis yesterday Still in afib Voiding well, no clots but urine is red and thin This morning labs charted with cr 4.17, however, patient never had labs drawn. Likely erroneous chart documentation.   Objective: Vital signs in last 24 hours: Temp:  [98.1 F (36.7 C)-98.7 F (37.1 C)] 98.2 F (36.8 C) (09/08 0552) Pulse Rate:  [96-142] 96 (09/08 0552) Resp:  [17-20] 18 (09/08 0552) BP: (110-124)/(61-85) 124/85 (09/08 0552) SpO2:  [96 %-98 %] 98 % (09/08 0552)  Assessment/Plan: # Ureteral stones S/p bilateral ureteral stent placement..  Right stent is tethered and will be removed in 4 to 5 days, around 9/10.  Laser lithotripsy will need to be scheduled for left side ureteral stone on an outpatient basis. Recommend 5 days oral ABX on discharge. No contraindication to discharge from urologic perspective  # Hematuria Red urine without clots He has restarted anticoagulation   Intake/Output from previous day: 09/07 0701 - 09/08 0700 In: 1738.5 [P.O.:720; I.V.:1018.5] Out: 1650 [Urine:1650]  Intake/Output this shift: No intake/output data recorded.  Physical Exam:  General: Alert and oriented CV: No cyanosis Lungs: equal chest rise Gu: voiding spontaneously, urine clear burgundy  Lab Results: Recent Labs    03/23/23 0513 03/24/23 0507  HGB 10.7* 10.2*  HCT 32.3* 31.5*   BMET Recent Labs    03/23/23 0513 03/24/23 0507  NA 134* 137  K 4.2 3.6  CL 103 105  CO2 22 23  GLUCOSE 135* 106*  BUN 25* 27*  CREATININE 1.54* 1.18  CALCIUM 8.2* 8.3*     Studies/Results: No results found.    LOS: 2 days    Cathren Harsh, MD [facility]  Consult discussed with Dr. Winter  03/25/2023, 8:42 AM

## 2023-03-25 NOTE — TOC Initial Note (Signed)
Transition of Care [facility]) - Initial/Assessment Note    Patient Details  Name: Kevin Doyle MRN: [id number] Date of Birth: 09-06-1950  [facility]) CM/SW Contact:    Kevin Doyle Phone Number: 03/25/2023, 9:37 AM  Clinical Narrative:                 No PCP listed; spoke w/ pt in room; pt says he does not have a PCP; he would like resources; pt given list of [facility] PCPs; he will contact provider of choice; no TOC needs.  Expected Discharge Plan: Home/Self Care Barriers to Discharge: No Barriers Identified   Patient Goals and CMS Choice Patient states their goals for this hospitalization and ongoing recovery are:: home CMS Medicare.gov Compare Post Acute Care list provided to:: Patient Choice offered to / list presented to : Patient      Expected Discharge Plan and Services   Discharge Planning Services: CM Consult Post Acute Care Choice: NA Living arrangements for the past 2 months: Single Family Home Expected Discharge Date: 03/25/23               DME Arranged: N/A DME Agency: NA       HH Arranged: NA HH Agency: NA        Prior Living Arrangements/Services Living arrangements for the past 2 months: Single Family Home Lives with:: Spouse Patient language and need for interpreter reviewed:: Yes Do you feel safe going back to the place where you live?: Yes      Need for Family Participation in Patient Care: Yes (Comment) Care giver support system in place?: Yes (comment) Current home services:  (n/a) Criminal Activity/Legal Involvement Pertinent to Current Situation/Hospitalization: No - Comment as needed  Activities of Daily Living Home Assistive Devices/Equipment: None ADL Screening (condition at time of admission) Patient's cognitive ability adequate to safely complete daily activities?: Yes Is the patient deaf or have difficulty hearing?: No Does the patient have difficulty seeing, even when wearing glasses/contacts?: No Does the  patient have difficulty concentrating, remembering, or making decisions?: No Patient able to express need for assistance with ADLs?: Yes Does the patient have difficulty dressing or bathing?: No Independently performs ADLs?: Yes (appropriate for developmental age) Does the patient have difficulty walking or climbing stairs?: No Weakness of Legs: None Weakness of Arms/Hands: None  Permission Sought/Granted Permission sought to share information with : Case Manager Permission granted to share information with : Yes, Verbal Permission Granted  Share Information with NAME: Case Manager     Permission granted to share info w Relationship: Kevin Doyle (spouse) [phone]     Emotional Assessment Appearance:: Appears stated age Attitude/Demeanor/Rapport: Gracious Affect (typically observed): Accepting Orientation: : Oriented to Self, Oriented to Place, Oriented to  Time, Oriented to Situation Alcohol / Substance Use: Not Applicable Psych Involvement: No (comment)  Admission diagnosis:  Hydronephrosis of left kidney [N13.30] Patient Active Problem List   Diagnosis Date Noted   Hydronephrosis of left kidney 03/22/2023   AKI (acute kidney injury) (HCC) 03/22/2023   Hypoalbuminemia 03/22/2023   Atrial fibrillation with RVR (HCC) 03/07/2023   CAP (community acquired pneumonia) 03/07/2023   A-fib (HCC) 03/07/2023   SBO (small bowel obstruction) (HCC) 09/01/2019   Renal calculi 09/01/2019   Elevated blood pressure reading 09/01/2019   Nausea with vomiting 08/27/2019   Central abdominal pain 08/27/2019   Diarrhea 08/27/2019   BPH (benign prostatic hyperplasia) 09/25/2013   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 [place] [address] Phone: [phone] Fax: [phone]  [facility] [address] [place] [address] Phone: [phone] Fax: [phone]     Social Determinants of Health (SDOH) Social History: SDOH Screenings   Food Insecurity: No Food Insecurity (03/25/2023)  Housing: Low Risk  (03/25/2023)  Transportation Needs: No Transportation Needs (03/25/2023)  Utilities: Not At Risk (03/25/2023)  Social Connections: Unknown (11/29/2021)   Received from [facility]  Tobacco Use: Medium Risk (03/22/2023)   SDOH Interventions: Food Insecurity Interventions: Intervention Not Indicated, Inpatient TOC Housing Interventions: Intervention Not Indicated, Inpatient TOC Transportation Interventions: Intervention Not Indicated, Inpatient TOC Utilities Interventions: Intervention Not Indicated, Inpatient TOC   Readmission Risk Interventions    03/25/2023    9:35 AM  Readmission Risk Prevention Plan  Transportation Screening Complete  PCP or Specialist Appt within 5-7 Days Complete  Home Care Screening Complete  Medication Review (Doyle CM) Complete

## 2023-03-25 NOTE — Progress Notes (Signed)
Progress Note  Patient Name: Kevin Doyle Date of Encounter: 03/25/2023  Primary Cardiologist:   Rollene Rotunda, MD   Subjective   Feels OK. Anxious to go home.  No SOB.  He does feel his heart racing slightly.   Inpatient Medications    Scheduled Meds:  amiodarone  200 mg Oral Daily   apixaban  5 mg Oral BID   cefdinir  300 mg Oral Q12H   digoxin  0.25 mg Oral Daily   diltiazem  10 mg Intravenous Once   metoprolol tartrate  100 mg Oral BID   tamsulosin  0.4 mg Oral QPC supper   Continuous Infusions:  sodium chloride 50 mL/hr at 03/24/23 1428   PRN Meds: acetaminophen **OR** acetaminophen, HYDROmorphone (DILAUDID) injection, metoprolol tartrate, ondansetron **OR** ondansetron (ZOFRAN) IV, mouth rinse, oxyCODONE, phenazopyridine   Vital Signs    Vitals:   03/24/23 1441 03/24/23 2021 03/24/23 2359 03/25/23 0552  BP:  122/74 122/74 124/85  Pulse:  (!) 113 96 96  Resp: 20 18 18 18   Temp:  98.7 F (37.1 C) 98.6 F (37 C) 98.2 F (36.8 C)  TempSrc:  Oral Oral Oral  SpO2:  97% 96% 98%  Weight:      Height:        Intake/Output Summary (Last 24 hours) at 03/25/2023 0747 Last data filed at 03/25/2023 0700 Gross per 24 hour  Intake 1738.51 ml  Output 1650 ml  Net 88.51 ml   Filed Weights   03/22/23 0528 03/22/23 1244  Weight: 90.7 kg 90.7 kg    Telemetry    Atrial fib with rapid rate - Personally Reviewed  ECG    NA - Personally Reviewed  Physical Exam   GEN: No acute distress.   Neck: No  JVD Cardiac: Irregular RR, no murmurs, rubs, or gallops.  Respiratory: Clear  to auscultation bilaterally. GI: Soft, nontender, non-distended  MS: No  edema; No deformity. Neuro:  Nonfocal  Psych: Normal affect   Labs    Chemistry Recent Labs  Lab 03/22/23 0523 03/23/23 0513 03/24/23 0507  NA 136 134* 137  K 4.0 4.2 3.6  CL 104 103 105  CO2 20* 22 23  GLUCOSE 123* 135* 106*  BUN 29* 25* 27*  CREATININE 1.90* 1.54* 1.18  CALCIUM 8.7* 8.2* 8.3*   PROT 7.0 5.7* 5.5*  ALBUMIN 3.2* 2.5* 2.4*  AST 16 12* 14*  ALT 21 15 18   ALKPHOS 92 80 72  BILITOT 1.4* 1.3* 0.8  GFRNONAA 37* 48* >60  ANIONGAP 12 9 9      Hematology Recent Labs  Lab 03/22/23 0523 03/23/23 0513 03/24/23 0507  WBC 12.4* 10.3 10.4  RBC 3.66* 3.61* 3.42*  HGB 11.1* 10.7* 10.2*  HCT 33.5* 32.3* 31.5*  MCV 91.5 89.5 92.1  MCH 30.3 29.6 29.8  MCHC 33.1 33.1 32.4  RDW 12.3 12.3 12.5  PLT 420* 378 384    Cardiac EnzymesNo results for input(s): "TROPONINI" in the last 168 hours. No results for input(s): "TROPIPOC" in the last 168 hours.   BNPNo results for input(s): "BNP", "PROBNP" in the last 168 hours.   DDimer No results for input(s): "DDIMER" in the last 168 hours.   Radiology    No results found.  Cardiac Studies   NA  Patient Profile     72 y.o. male with a hx of PAF who is being seen today for the evaluation of atrial fib at the request of Dr. Ashley Royalty.   Assessment & Plan  PAF.  He is going to see EP later this month. Restarted Eliquis last night and increased metoprolol yesterday.  Also on dig.  He will let me know what his heart rate is doing at home and we will adjust meds.  If he is still in fib in 3 weeks and has had DOAC continuous x 3 weeks he might need DCCV.  Time of the next steps for his kidney stone will also dictate any attempted cardioversion and the timing of ultimate atrial fib ablation.    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  Increase beta blocker.  I will follow EF in NSR and titrate meds and hope for EF improvement.  If over the months to come the EF remains low in NSR I will do an ischemia work up.      For questions or updates, please contact [facility] Please consult [email or web address] for contact info under Cardiology/STEMI.   Signed, Rollene Rotunda, MD  03/25/2023, 7:47 AM

## 2023-03-26 ENCOUNTER — Telehealth: Payer: Self-pay | Admitting: Cardiology

## 2023-03-26 ENCOUNTER — Other Ambulatory Visit: Payer: Self-pay | Admitting: Cardiology

## 2023-03-26 ENCOUNTER — Other Ambulatory Visit (HOSPITAL_BASED_OUTPATIENT_CLINIC_OR_DEPARTMENT_OTHER): Payer: Self-pay

## 2023-03-26 NOTE — Telephone Encounter (Signed)
Pt returning nurse's call. Please advise

## 2023-03-26 NOTE — Telephone Encounter (Signed)
Returned call to patient. LVM to call back.

## 2023-03-26 NOTE — Telephone Encounter (Signed)
Pt c/o medication issue:  1. Name of Medication: digoxin (LANOXIN) 0.25 MG tablet   metoprolol tartrate (LOPRESSOR) 100 MG tablet   2. How are you currently taking this medication (dosage and times per day)? Take 1 tablet (0.25 mg total) by mouth daily.   Take 1 tablet (100 mg total) by mouth 2 (two) times daily.       3. Are you having a reaction (difficulty breathing--STAT)? No  4. What is your medication issue? Pt is requesting a callback regarding him being in the hospital recently and they prescribed him these medications but he'd like to know if he should be on fast release or extended release along with a few other questions. Please advise

## 2023-03-26 NOTE — Telephone Encounter (Signed)
Returned call to pt. Pt. States he was released from the Hospital yesterday. He was there for kidney stones and his heart went into Afib while there. Pt is hoping to get an ablation done. He is wanting to know about the medication he is taking. He is concerned about the Metoprolol. He states it is now on a quick release and was on an extended release. He wants to know if he needs to take the digoxin as well. Pt. HR is 91 at this time. Pt took a slow release Metoprolol last night and this morning he took a quick release. His BP this morning was 137/84 and HR 104 and it was before meds. Right now his BP 132/72 HR is 92. When he is in "normal" rhythm is HR is 60-70"s. Please advise.

## 2023-03-28 ENCOUNTER — Other Ambulatory Visit: Payer: Self-pay | Admitting: *Deleted

## 2023-03-28 ENCOUNTER — Other Ambulatory Visit (HOSPITAL_BASED_OUTPATIENT_CLINIC_OR_DEPARTMENT_OTHER): Payer: Self-pay

## 2023-03-28 MED ORDER — DIGOXIN 250 MCG PO TABS: 0.2500 mg | ORAL_TABLET | Freq: Every day | ORAL | 2 refills | Status: DC

## 2023-03-28 MED ORDER — APIXABAN 5 MG PO TABS
5.0000 mg | ORAL_TABLET | Freq: Two times a day (BID) | ORAL | 6 refills | Status: DC
  Filled 2023-03-28: qty 60, 30d supply, fill #0

## 2023-03-28 MED ORDER — APIXABAN 5 MG PO TABS: 5.0000 mg | ORAL_TABLET | Freq: Two times a day (BID) | ORAL | 6 refills | Status: DC

## 2023-03-28 NOTE — Telephone Encounter (Signed)
Spoke with pt, aware of dr hochrein's recommendations. ?

## 2023-03-29 ENCOUNTER — Encounter: Payer: Self-pay | Admitting: Urology

## 2023-03-29 ENCOUNTER — Ambulatory Visit (HOSPITAL_BASED_OUTPATIENT_CLINIC_OR_DEPARTMENT_OTHER)
Admission: RE | Admit: 2023-03-29 | Discharge: 2023-03-29 | Disposition: A | Discharge status: Home / Self Care | Payer: Medicare Other | Source: Ambulatory Visit | Attending: Urology | Admitting: Urology

## 2023-03-29 ENCOUNTER — Ambulatory Visit (INDEPENDENT_AMBULATORY_CARE_PROVIDER_SITE_OTHER): Payer: Medicare Other | Admitting: Urology

## 2023-03-29 VITALS — BP 151/80 | HR 85 | Ht 74.0 in | Wt 198.0 lb

## 2023-03-29 DIAGNOSIS — R8290 Unspecified abnormal findings in urine: Secondary | ICD-10-CM

## 2023-03-29 DIAGNOSIS — N200 Calculus of kidney: Secondary | ICD-10-CM | POA: Diagnosis present

## 2023-03-29 DIAGNOSIS — N201 Calculus of ureter: Secondary | ICD-10-CM | POA: Diagnosis not present

## 2023-03-29 LAB — URINALYSIS, ROUTINE W REFLEX MICROSCOPIC
Bilirubin, UA: NEGATIVE
Glucose, UA: NEGATIVE
Ketones, UA: NEGATIVE
Nitrite, UA: NEGATIVE
Specific Gravity, UA: 1.02 (ref 1.005–1.030)
Urobilinogen, Ur: 0.2 mg/dL (ref 0.2–1.0)
pH, UA: 6 (ref 5.0–7.5)

## 2023-03-29 LAB — MICROSCOPIC EXAMINATION: RBC, Urine: >30 /HPF — AB (ref 0–2)

## 2023-03-29 NOTE — Progress Notes (Addendum)
Assessment: 1. Bilateral ureteral calculi   2. Abnormal urine findings     Plan: KUB from today reviewed.  Left ureteral stent in good position with 8 x 10 mm calcification in upper ureter. Options for management of left proximal ureteral calculus discussed include shock wave lithotripsy and ureteroscopic laser lithotripsy.  Risk and benefits of each procedure discussed. We will need to coordinate any procedures with cardiology given his ongoing atrial fibrillation and need for anticoagulation.  I recommended that he consider ureteroscopic laser lithotripsy as the best option for management of the proximal left ureteral calculus.  Ideally, if it is possible to hold his Eliquis in preparation for this that would reduce the risk of intraoperative and postoperative bleeding.  I will need to discuss this with his cardiologist. Will tentatively plan on left ureteroscopic laser lithotripsy on 04/10/2023.  Risks, benefits, alternatives were discussed with the patient in detail.  Potential risks including, but not limited to, infection; bleeding;  injury to urethra, bladder, or ureter; possible need of other treatments; possible failure to remove the calculus; ureteral  stricture formation; cardiac, pulmonary, cerebrovascular events; and anesthetic complications were discussed.  The patient understands and wishes to proceed.  Urine culture sent today Stone analysis sent.  Procedure: The patient will be scheduled for cystoscopy, left retrograde pyelogram, left ureteroscopy with laser lithotripsy, exchange of left [facility]teral stent at [facility].  Surgical request is placed with the surgery schedulers and will be scheduled at the patient's/family request. Informed consent is given as documented below. Anesthesia: General  The patient does not have sleep apnea, history of MRSA, history of VRE, history of cardiac device requiring special anesthetic needs. Patient is stable and considered clear for surgical  in an outpatient ambulatory surgery setting as well as patient hospital setting.  Consent for Operation or Procedure: Provider Certification I hereby certify that the nature, purpose, benefits, usual and most frequent risks of, and alternatives to, the operation or procedure have been explained to the patient (or person authorized to sign for the patient) either by me as responsible physician or by the provider who is to perform the operation or procedure. Time spent such that the patient/family has had an opportunity to ask questions, and that those questions have been answered. The patient or the patient's representative has been advised that selected tasks may be performed by assistants to the primary health care provider(s). I believe that the patient (or person authorized to sign for the patient) understands what has been explained, and has consented to the operation or procedure. No guarantees were implied or made.   Chief Complaint: Chief Complaint  Patient presents with   Nephrolithiasis    HPI: Kevin Doyle is a 72 y.o. male who presents for continued evaluation of bilateral ureteral calculi. He presented to the emergency room on 9/5 with worsening left-sided flank pain.  He had intermittent flank pain for some time.  No prior history of kidney stones.  CT imaging showed a 10 mm calculus in the proximal left ureter with associated obstruction and a 9 mm calculus in the right distal ureter without obstructive changes.  His creatinine was slightly increased to 1.9 from recent level of 1.32 1 week prior.  No fevers or chills.  Urinalysis showed 0-5 RBCs, 0-5 WBCs, and rare bacteria. He was taken to the OR on 03/22/23 for cystoscopy, bilateral retrograde pyelograms, right ureteroscopy with laser lithotripsy and insertion of bilateral ureteral stents.  The right distal ureteral calculus was removed. He had  bilateral ureteral stents, right stent with tether.   He removed the right ureteral  stent 2 days ago. He is not having any flank pain.  His hematuria is improving.  No fevers or chills.  He is not currently on an antibiotic. He continues on Eliquis for his A-fib.  Portions of the above documentation were copied from a prior visit for review purposes only.  Allergies: No Known Allergies  PMH: Past Medical History:  Diagnosis Date   Atrial fibrillation (HCC)    last episode was 02/10/13   GERD (gastroesophageal reflux disease)    Gilbert's disease     PSH: Past Surgical History:  Procedure Laterality Date   CARDIOVERSION N/A 03/09/2023   Procedure: CARDIOVERSION;  Surgeon: Christell Constant, MD;  Location: [facility];  Service: Cardiovascular;  Laterality: N/A;   CARDIOVERSION N/A 03/12/2023   Procedure: CARDIOVERSION;  Surgeon: Jake Bathe, MD;  Location: [facility];  Service: Cardiovascular;  Laterality: N/A;   CHOLECYSTECTOMY  05/18/2012   Procedure: LAPAROSCOPIC CHOLECYSTECTOMY WITH INTRAOPERATIVE CHOLANGIOGRAM;  Surgeon: Valarie Merino, MD;  Location: [facility];  Service: General;  Laterality: N/A;   COLONOSCOPY  10/08/2019   2005, 2010   CYSTOSCOPY/URETEROSCOPY/HOLMIUM LASER/STENT PLACEMENT Bilateral 03/22/2023   Procedure: BILATERAL CYSTOSCOPY RETROGRADE, RIGHT URETEROSCOPY WITH HOLMIUM LASER, BILATERAL STENT PLACEMENT;  Surgeon: Milderd Meager., MD;  Location: [facility];  Service: Urology;  Laterality: Bilateral;   ERCP  07/11/2012   Procedure: ENDOSCOPIC RETROGRADE CHOLANGIOPANCREATOGRAPHY (ERCP);  Surgeon: Louis Meckel, MD;  Location: [facility];  Service: Endoscopy;  Laterality: N/A;   Hernia repairs     x 3    INGUINAL HERNIA REPAIR     bilateral   LAPAROSCOPIC LYSIS OF ADHESIONS  06/24/2012   Procedure: LAPAROSCOPIC LYSIS OF ADHESIONS;  Surgeon: Crecencio Mc, MD;  Location: [facility];  Service: Urology;;   ROBOTIC ASSITED PARTIAL NEPHRECTOMY  06/24/2012   Procedure: ROBOTIC ASSITED PARTIAL NEPHRECTOMY;  Surgeon: Crecencio Mc, MD;   Location: [facility];  Service: Urology;  Laterality: Right;   TEE WITHOUT CARDIOVERSION N/A 03/09/2023   Procedure: TRANSESOPHAGEAL ECHOCARDIOGRAM;  Surgeon: Christell Constant, MD;  Location: [facility];  Service: Cardiovascular;  Laterality: N/A;   UPPER GASTROINTESTINAL ENDOSCOPY  10/08/2019   2005,2010,2013    SH: Social History   Tobacco Use   Smoking status: Former   Smokeless tobacco: Never   Tobacco comments:    Quit at age 55   Vaping Use   Vaping status: Never Used  Substance Use Topics   Alcohol use: Yes    Comment: Rare-Beer    Drug use: No    ROS: Constitutional:  Negative for fever, chills, weight loss CV: Negative for chest pain, previous MI, hypertension Respiratory:  Negative for shortness of breath, wheezing, sleep apnea, frequent cough GI:  Negative for nausea, vomiting, bloody stool, GERD  PE: BP (!) 151/80   Pulse 85   Ht 6\' 2"  (1.88 m)   Wt 198 lb (89.8 kg)   BMI 25.42 kg/m  GENERAL APPEARANCE:  Well appearing, well developed, well nourished, NAD HEENT:  Atraumatic, normocephalic, oropharynx clear NECK:  Supple without lymphadenopathy or thyromegaly ABDOMEN:  Soft, non-tender, no masses EXTREMITIES:  Moves all extremities well, without clubbing, cyanosis, or edema NEUROLOGIC:  Alert and oriented x 3, normal gait, CN II-XII grossly intact MENTAL STATUS:  appropriate BACK:  Non-tender to palpation, No CVAT SKIN:  Warm, dry, and intact   Results: U/A: 0-5 WBCs, >30 RBCs,  many bacteria, nitrite negative

## 2023-03-29 NOTE — Anesthesia Postprocedure Evaluation (Signed)
Anesthesia Post Note  Patient: Kevin Doyle  Procedure(s) Performed: BILATERAL CYSTOSCOPY RETROGRADE, RIGHT URETEROSCOPY WITH HOLMIUM LASER, BILATERAL STENT PLACEMENT (Bilateral: Bladder)     Patient location during evaluation: PACU Anesthesia Type: General Level of consciousness: awake and alert Pain management: pain level controlled Vital Signs Assessment: post-procedure vital signs reviewed and stable Respiratory status: spontaneous breathing, nonlabored ventilation, respiratory function stable and patient connected to nasal cannula oxygen Cardiovascular status: blood pressure returned to baseline and stable Postop Assessment: no apparent nausea or vomiting Anesthetic complications: no   No notable events documented.  Last Vitals:  Vitals:   03/24/23 2359 03/25/23 0552  BP: 122/74 124/85  Pulse: 96 96  Resp: 18 18  Temp: 37 C 36.8 C  SpO2: 96% 98%    Last Pain:  Vitals:   03/25/23 0552  TempSrc: Oral  PainSc:                  Shane Melby

## 2023-03-29 NOTE — H&P (View-Only) (Signed)
Assessment: 1. Bilateral ureteral calculi   2. Abnormal urine findings     Plan: KUB from today reviewed.  Left ureteral stent in good position with 8 x 10 mm calcification in upper ureter. Options for management of left proximal ureteral calculus discussed include shock wave lithotripsy and ureteroscopic laser lithotripsy.  Risk and benefits of each procedure discussed. We will need to coordinate any procedures with cardiology given his ongoing atrial fibrillation and need for anticoagulation.  I recommended that he consider ureteroscopic laser lithotripsy as the best option for management of the proximal left ureteral calculus.  Ideally, if it is possible to hold his Eliquis in preparation for this that would reduce the risk of intraoperative and postoperative bleeding.  I will need to discuss this with his cardiologist. Will tentatively plan on left ureteroscopic laser lithotripsy on 04/10/2023.  Risks, benefits, alternatives were discussed with the patient in detail.  Potential risks including, but not limited to, infection; bleeding;  injury to urethra, bladder, or ureter; possible need of other treatments; possible failure to remove the calculus; ureteral  stricture formation; cardiac, pulmonary, cerebrovascular events; and anesthetic complications were discussed.  The patient understands and wishes to proceed.  Urine culture sent today Stone analysis sent.  Procedure: The patient will be scheduled for cystoscopy, left retrograde pyelogram, left ureteroscopy with laser lithotripsy, exchange of left ureteral stent at [facility].  Surgical request is placed with the surgery schedulers and will be scheduled at the patient's/family request. Informed consent is given as documented below. Anesthesia: General  The patient does not have sleep apnea, history of MRSA, history of VRE, history of cardiac device requiring special anesthetic needs. Patient is stable and considered clear for surgical  in an outpatient ambulatory surgery setting as well as patient hospital setting.  Consent for Operation or Procedure: Provider Certification I hereby certify that the nature, purpose, benefits, usual and most frequent risks of, and alternatives to, the operation or procedure have been explained to the patient (or person authorized to sign for the patient) either by me as responsible physician or by the provider who is to perform the operation or procedure. Time spent such that the patient/family has had an opportunity to ask questions, and that those questions have been answered. The patient or the patient's representative has been advised that selected tasks may be performed by assistants to the primary health care provider(s). I believe that the patient (or person authorized to sign for the patient) understands what has been explained, and has consented to the operation or procedure. No guarantees were implied or made.   Chief Complaint: Chief Complaint  Patient presents with   Nephrolithiasis    HPI: Kevin Doyle is a 72 y.o. male who presents for continued evaluation of bilateral ureteral calculi. He presented to the emergency room on 9/5 with worsening left-sided flank pain.  He had intermittent flank pain for some time.  No prior history of kidney stones.  CT imaging showed a 10 mm calculus in the proximal left ureter with associated obstruction and a 9 mm calculus in the right distal ureter without obstructive changes.  His creatinine was slightly increased to 1.9 from recent level of 1.32 1 week prior.  No fevers or chills.  Urinalysis showed 0-5 RBCs, 0-5 WBCs, and rare bacteria. He was taken to the OR on 03/22/23 for cystoscopy, bilateral retrograde pyelograms, right ureteroscopy with laser lithotripsy and insertion of bilateral ureteral stents.  The right distal ureteral calculus was removed. He had  bilateral ureteral stents, right stent with tether.   He removed the right ureteral  stent 2 days ago. He is not having any flank pain.  His hematuria is improving.  No fevers or chills.  He is not currently on an antibiotic. He continues on Eliquis for his A-fib.  Portions of the above documentation were copied from a prior visit for review purposes only.  Allergies: No Known Allergies  PMH: Past Medical History:  Diagnosis Date   Atrial fibrillation (HCC)    last episode was 02/10/13   GERD (gastroesophageal reflux disease)    Gilbert's disease     PSH: Past Surgical History:  Procedure Laterality Date   CARDIOVERSION N/A 03/09/2023   Procedure: CARDIOVERSION;  Surgeon: Christell Constant, MD;  Location: [facility];  Service: Cardiovascular;  Laterality: N/A;   CARDIOVERSION N/A 03/12/2023   Procedure: CARDIOVERSION;  Surgeon: Jake Bathe, MD;  Location: [facility];  Service: Cardiovascular;  Laterality: N/A;   CHOLECYSTECTOMY  05/18/2012   Procedure: LAPAROSCOPIC CHOLECYSTECTOMY WITH INTRAOPERATIVE CHOLANGIOGRAM;  Surgeon: Valarie Merino, MD;  Location: [facility] ORS;  Service: General;  Laterality: N/A;   COLONOSCOPY  10/08/2019   2005, 2010   CYSTOSCOPY/URETEROSCOPY/HOLMIUM LASER/STENT PLACEMENT Bilateral 03/22/2023   Procedure: BILATERAL CYSTOSCOPY RETROGRADE, RIGHT URETEROSCOPY WITH HOLMIUM LASER, BILATERAL STENT PLACEMENT;  Surgeon: Milderd Meager., MD;  Location: [facility] OR[facility];  Service: Urology;  Laterality: Bilateral;   ERCP  07/11/2012   Procedure: ENDOSCOPIC RETROGRADE CHOLANGIOPANCREATOGRAPHY (ERCP);  Surgeon: Louis Meckel, MD;  Location: [facility];  Service: Endoscopy;  Laterality: N/A;   Hernia repairs     x 3    INGUINAL HERNIA REPAIR     bilateral   LAPAROSCOPIC LYSIS OF ADHESIONS  06/24/2012   Procedure: LAPAROSCOPIC LYSIS OF ADHESIONS;  Surgeon: Crecencio Mc, MD;  Location: [facility];  Service: Urology;;   ROBOTIC ASSITED PARTIAL NEPHRECTOMY  06/24/2012   Procedure: ROBOTIC ASSITED PARTIAL NEPHRECTOMY;  Surgeon: Crecencio Mc, MD;   Location: [facility];  Service: Urology;  Laterality: Right;   TEE WITHOUT CARDIOVERSION N/A 03/09/2023   Procedure: TRANSESOPHAGEAL ECHOCARDIOGRAM;  Surgeon: Christell Constant, MD;  Location: [facility];  Service: Cardiovascular;  Laterality: N/A;   UPPER GASTROINTESTINAL ENDOSCOPY  10/08/2019   2005,2010,2013    SH: Social History   Tobacco Use   Smoking status: Former   Smokeless tobacco: Never   Tobacco comments:    Quit at age 67   Vaping Use   Vaping status: Never Used  Substance Use Topics   Alcohol use: Yes    Comment: Rare-Beer    Drug use: No    ROS: Constitutional:  Negative for fever, chills, weight loss CV: Negative for chest pain, previous MI, hypertension Respiratory:  Negative for shortness of breath, wheezing, sleep apnea, frequent cough GI:  Negative for nausea, vomiting, bloody stool, GERD  PE: BP (!) 151/80   Pulse 85   Ht 6\' 2"  (1.88 m)   Wt 198 lb (89.8 kg)   BMI 25.42 kg/m  GENERAL APPEARANCE:  Well appearing, well developed, well nourished, NAD HEENT:  Atraumatic, normocephalic, oropharynx clear NECK:  Supple without lymphadenopathy or thyromegaly ABDOMEN:  Soft, non-tender, no masses EXTREMITIES:  Moves all extremities well, without clubbing, cyanosis, or edema NEUROLOGIC:  Alert and oriented x 3, normal gait, CN II-XII grossly intact MENTAL STATUS:  appropriate BACK:  Non-tender to palpation, No CVAT SKIN:  Warm, dry, and intact   Results: U/A: 0-5 WBCs, >30 RBCs,  many bacteria, nitrite negative

## 2023-03-30 ENCOUNTER — Ambulatory Visit: Payer: Self-pay | Admitting: Urology

## 2023-03-30 DIAGNOSIS — N201 Calculus of ureter: Secondary | ICD-10-CM

## 2023-03-30 NOTE — Progress Notes (Signed)
Sent message, via epic in basket, requesting orders in epic from surgeon.  

## 2023-03-31 LAB — URINE CULTURE: Organism ID, Bacteria: NO GROWTH

## 2023-04-01 ENCOUNTER — Encounter: Payer: Self-pay | Admitting: Urology

## 2023-04-01 MED ORDER — SULFAMETHOXAZOLE-TRIMETHOPRIM 800-160 MG PO TABS
1.0000 | ORAL_TABLET | Freq: Every day | ORAL | 0 refills | Status: DC
Start: 2023-04-01 — End: 2023-04-10

## 2023-04-01 NOTE — Addendum Note (Signed)
Addended by: Milderd Meager on: 04/01/2023 10:52 AM   Modules accepted: Orders

## 2023-04-02 NOTE — Progress Notes (Signed)
COVID Vaccine Completed:  Date of COVID positive in last 90 days:  PCP -  Cardiologist - Rollene Rotunda, MD  Chest x-ray - 03/11/23 Epic EKG - 03/12/23 Epic Stress Test -  ECHO - 03/09/23 Epic Cardiac Cath -  Pacemaker/ICD device last checked: Spinal Cord Stimulator:  Bowel Prep -   Sleep Study -  CPAP -   Fasting Blood Sugar -  Checks Blood Sugar _____ times a day  Last dose of GLP1 agonist-  N/A GLP1 instructions:  N/A   Last dose of SGLT-2 inhibitors-  N/A SGLT-2 instructions: N/A   Blood Thinner Instructions:  Eliquis Aspirin Instructions: Last Dose:  Activity level:  Can go up a flight of stairs and perform activities of daily living without stopping and without symptoms of chest pain or shortness of breath.  Able to exercise without symptoms  Unable to go up a flight of stairs without symptoms of     Anesthesia review:  fib, dizzy  Patient denies shortness of breath, fever, cough and chest pain at PAT appointment  Patient verbalized understanding of instructions that were given to them at the PAT appointment. Patient was also instructed that they will need to review over the PAT instructions again at home before surgery.

## 2023-04-02 NOTE — Patient Instructions (Signed)
SURGICAL WAITING ROOM VISITATION  Patients having surgery or a procedure may have no more than 2 support people in the waiting area - these visitors may rotate.    Children under the age of 88 must have an adult with them who is not the patient.  Due to an increase in RSV and influenza rates and associated hospitalizations, children ages 53 and under may not visit patients in [facility] hospitals.  If the patient needs to stay at the hospital during part of their recovery, the visitor guidelines for inpatient rooms apply. Pre-op nurse will coordinate an appropriate time for 1 support person to accompany patient in pre-op.  This support person may not rotate.    Please refer to the [facility] website for the visitor guidelines for Inpatients (after your surgery is over and you are in a regular room).    Your procedure is scheduled on: 04/10/23   Report to [facility] Main Entrance    Report to admitting at 1:45 PM   Call this number if you have problems the morning of surgery [phone]   Do not eat food or drink liquids :After Midnight.          If you have questions, please contact your surgeon's office.   FOLLOW BOWEL PREP AND ANY ADDITIONAL PRE OP INSTRUCTIONS YOU RECEIVED FROM YOUR SURGEON'S OFFICE!!!     Oral Hygiene is also important to reduce your risk of infection.                                    Remember - BRUSH YOUR TEETH THE MORNING OF SURGERY WITH YOUR REGULAR TOOTHPASTE  DENTURES WILL BE REMOVED PRIOR TO SURGERY PLEASE DO NOT APPLY "Poly grip" OR ADHESIVES!!!   Stop all vitamins and herbal supplements 7 days before surgery.   Take these medicines the morning of surgery with A SIP OF WATER: Amiodarone, Metoprolol, Oxycodone   These are anesthesia recommendations for holding your anticoagulants.  Please contact your prescribing physician to confirm IF it is safe to hold your anticoagulants for this length of time.   Eliquis Apixaban   72 hours    Xarelto Rivaroxaban   72 hours  Plavix Clopidogrel   120 hours  Pletal Cilostazol   120 hours                                You may not have any metal on your body including jewelry, and body piercing             Do not wear lotions, powders, cologne, or deodorant              Men may shave face and neck.   Do not bring valuables to the hospital.  IS NOT             RESPONSIBLE   FOR VALUABLES.   Contacts, glasses, dentures or bridgework may not be worn into surgery.  DO NOT BRING YOUR HOME MEDICATIONS TO THE HOSPITAL. PHARMACY WILL DISPENSE MEDICATIONS LISTED ON YOUR MEDICATION LIST TO YOU DURING YOUR ADMISSION IN THE HOSPITAL!    Patients discharged on the day of surgery will not be allowed to drive home.  Someone NEEDS to stay with you for the first 24 hours after anesthesia.   Special Instructions: Bring a copy of your healthcare power of  attorney and living will documents the day of surgery if you haven't scanned them before.              Please read over the following fact sheets you were given: IF YOU HAVE QUESTIONS ABOUT YOUR PRE-OP INSTRUCTIONS PLEASE CALL [phone] Contras   If you received a COVID test during your pre-op visit  it is requested that you wear a mask when out in public, stay away from anyone that may not be feeling well and notify your surgeon if you develop symptoms. If you test positive for Covid or have been in contact with anyone that has tested positive in the last 10 days please notify you surgeon.    [place] - Preparing for Surgery Before surgery, you can play an important role.  Because skin is not sterile, your skin needs to be as free of germs as possible.  You can reduce the number of germs on your skin by washing with CHG (chlorahexidine gluconate) soap before surgery.  CHG is an antiseptic cleaner which kills germs and bonds with the skin to continue killing germs even after washing. Please DO NOT use if you have an allergy to  CHG or antibacterial soaps.  If your skin becomes reddened/irritated stop using the CHG and inform your nurse when you arrive at Short Stay. Do not shave (including legs and underarms) for at least 48 hours prior to the first CHG shower.  You may shave your face/neck.  Please follow these instructions carefully:  1.  Shower with CHG Soap the night before surgery and the  morning of surgery.  2.  If you choose to wash your hair, wash your hair first as usual with your normal  shampoo.  3.  After you shampoo, rinse your hair and body thoroughly to remove the shampoo.                             4.  Use CHG as you would any other liquid soap.  You can apply chg directly to the skin and wash.  Gently with a scrungie or clean washcloth.  5.  Apply the CHG Soap to your body ONLY FROM THE NECK DOWN.   Do   not use on face/ open                           Wound or open sores. Avoid contact with eyes, ears mouth and   genitals (private parts).                       Wash face,  Genitals (private parts) with your normal soap.             6.  Wash thoroughly, paying special attention to the area where your    surgery  will be performed.  7.  Thoroughly rinse your body with warm water from the neck down.  8.  DO NOT shower/wash with your normal soap after using and rinsing off the CHG Soap.                9.  Pat yourself dry with a clean towel.            10.  Wear clean pajamas.            11.  Place clean sheets on your bed the night of your first shower and  do not  sleep with pets. Day of Surgery : Do not apply any lotions/deodorants the morning of surgery.  Please wear clean clothes to the hospital/surgery center.  FAILURE TO FOLLOW THESE INSTRUCTIONS MAY RESULT IN THE CANCELLATION OF YOUR SURGERY  PATIENT SIGNATURE_________________________________  NURSE SIGNATURE__________________________________  ________________________________________________________________________

## 2023-04-03 ENCOUNTER — Encounter (HOSPITAL_COMMUNITY): Payer: Self-pay

## 2023-04-03 ENCOUNTER — Encounter (HOSPITAL_COMMUNITY)
Admission: RE | Admit: 2023-04-03 | Discharge: 2023-04-03 | Disposition: A | Discharge status: Home / Self Care | Payer: Medicare Other | Source: Ambulatory Visit | Attending: Urology | Admitting: Urology

## 2023-04-03 ENCOUNTER — Other Ambulatory Visit: Payer: Self-pay

## 2023-04-03 ENCOUNTER — Telehealth: Payer: Self-pay

## 2023-04-03 DIAGNOSIS — N201 Calculus of ureter: Secondary | ICD-10-CM | POA: Diagnosis not present

## 2023-04-03 DIAGNOSIS — I4891 Unspecified atrial fibrillation: Secondary | ICD-10-CM | POA: Insufficient documentation

## 2023-04-03 DIAGNOSIS — Z7901 Long term (current) use of anticoagulants: Secondary | ICD-10-CM | POA: Insufficient documentation

## 2023-04-03 DIAGNOSIS — Z79899 Other long term (current) drug therapy: Secondary | ICD-10-CM | POA: Diagnosis not present

## 2023-04-03 DIAGNOSIS — Z01812 Encounter for preprocedural laboratory examination: Secondary | ICD-10-CM | POA: Diagnosis not present

## 2023-04-03 DIAGNOSIS — Z01818 Encounter for other preprocedural examination: Secondary | ICD-10-CM | POA: Diagnosis present

## 2023-04-03 HISTORY — DX: Personal history of urinary calculi: Z87.442

## 2023-04-03 HISTORY — DX: Other reaction to spinal and lumbar puncture: G97.1

## 2023-04-03 HISTORY — DX: Unspecified osteoarthritis, unspecified site: M19.90

## 2023-04-03 NOTE — Telephone Encounter (Signed)
-----   Message from [facility] sent at 04/03/2023 11:58 AM EDT ----- Regarding: RE: cardiac clearance Yes,  He can hold his Eliquis for 2 days pre-op. See note below from Dr. Antoine Poche with Cardiology:  Dr. Pete Glatter, if you want to put this in your note for preop. The patient is OK to hold his Eliquis for two days prior to the procedure. He is at acceptable risk for the planned procedure without further cardiac work up. Rate control might be difficult if he is in atrial fib.  The long term plan is ablation. We would be happy to assist with him at the time of the the procedure if rate control is an issue. He likely will have the BP to use IV Cardizem or short acting or IV beta blocker. Thanks. Leta Jungling ----- Message ----- From: Carolin Coy, CMA Sent: 04/03/2023   8:57 AM EDT To: Milderd Meager, MD Subject: cardiac clearance                              Have you heard back from this patient's cardiologist about his Eliquis?

## 2023-04-04 ENCOUNTER — Telehealth: Payer: Self-pay | Admitting: *Deleted

## 2023-04-04 NOTE — Anesthesia Preprocedure Evaluation (Addendum)
Anesthesia Evaluation  Patient identified by MRN, date of birth, ID band Patient awake    Reviewed: Allergy & Precautions, NPO status , Patient's Chart, lab work & pertinent test results, reviewed documented beta blocker date and time   History of Anesthesia Complications (+) POST - OP SPINAL HEADACHE and history of anesthetic complications  Airway Mallampati: I  TM Distance: >3 FB     Dental  (+) Dental Advisory Given, Teeth Intact   Pulmonary pneumonia, resolved, former smoker   Pulmonary exam normal breath sounds clear to auscultation       Cardiovascular +CHF  + dysrhythmias Atrial Fibrillation + Valvular Problems/Murmurs MR  Rhythm:Irregular Rate:Normal  TEE 03/09/2023  1. Left ventricular ejection fraction, by estimation, is 45 to 50%. The left ventricle has mildly decreased function. There is severe left ventricular hypertrophy.   2. Right ventricular systolic function is low normal. The right ventricular size is mildly enlarged.   3. Left atrial size was mildly dilated. No left atrial/left atrial appendage thrombus was detected.   4. Right atrial size was mildly dilated.   5. A small pericardial effusion is present. The pericardial effusion is localized near the right atrium.   6. The mitral valve is normal in structure. Mild to moderate mitral valve regurgitation. No evidence of mitral stenosis.   7. The aortic valve is tricuspid. Aortic valve regurgitation is not visualized. No aortic stenosis is present.   8. Mildly dilated pulmonary artery.   9. The inferior vena cava is normal in size with greater than 50% respiratory variability, suggesting right atrial pressure of 3 mmHg.    TTE 03/08/2023  1. Left ventricular ejection fraction, by estimation, is 45 to 50%. The left ventricle has mildly decreased function. The left ventricle demonstrates global hypokinesis. The left ventricular internal cavity size was mildly to  moderately dilated. Left ventricular diastolic function could not be evaluated.   2. Right ventricular systolic function is normal. The right ventricular size is normal. There is moderately elevated pulmonary artery systolic pressure.   3. The mitral valve is grossly normal. Mild to moderate mitral valve regurgitation. No evidence of mitral stenosis.   4. The aortic valve is tricuspid. There is mild calcification of the aortic valve. Aortic valve regurgitation is not visualized. No aortic stenosis is present.   5. The inferior vena cava is dilated in size with <50% respiratory variability, suggesting right atrial pressure of 15 mmHg.    Neuro/Psych  Headaches  negative psych ROS   GI/Hepatic ,GERD  Medicated,,Gilbert's syndrome   Endo/Other  negative endocrine ROS    Renal/GU Renal diseaseBilateral ureteral calculi Left hydronephrosis  negative genitourinary   Musculoskeletal  (+) Arthritis ,    Abdominal   Peds  Hematology  (+) Blood dyscrasia, anemia Eliquis therapy- last dose 9/4   Anesthesia Other Findings   Reproductive/Obstetrics                              Anesthesia Physical Anesthesia Plan  ASA: 3  Anesthesia Plan: General   Post-op Pain Management: Tylenol PO (pre-op)*   Induction: Intravenous  PONV Risk Score and Plan: 3 and Ondansetron, Dexamethasone and Treatment may vary due to age or medical condition  Airway Management Planned: LMA  Additional Equipment: None  Intra-op Plan:   Post-operative Plan: Extubation in OR  Informed Consent: I have reviewed the patients History and Physical, chart, labs and discussed the procedure including the risks, benefits and  alternatives for the proposed anesthesia with the patient or authorized representative who has indicated his/her understanding and acceptance.     Dental advisory given  Plan Discussed with: CRNA  Anesthesia Plan Comments: (See PAT note 04/03/2023)          Anesthesia Quick Evaluation

## 2023-04-04 NOTE — Progress Notes (Signed)
Anesthesia Chart Review   Case: [id number] Date/Time: 04/10/23 1545   Procedure: CYSTOSCOPY WITH RETROGRADE PYELOGRAM, URETEROSCOPY AND STENT PLACEMENT (Left)   Anesthesia type: General   Pre-op diagnosis: left ureteral stone   Location: [facility] 03 / [facility]   Surgeons: Milderd Meager., MD       DISCUSSION:72 y.o. former smoker with h/o atrial fibrillation, Gilbert's disease, left ureteral stone scheduled for above procedure 04/10/23 with Dr. Di Kindle.   Pt with recent admission 9/5-03/25/2023 with kidney stones, hydronephrosis of left kidney. Pt experiencing afib with RVR during admission, seen by cardiology.   Per Dr. Jenene Slicker note, "Dr. Pete Glatter, if you want to put this in your note for preop.  The patient is OK to hold his Eliquis for two days prior to the procedure.  He is at acceptable risk for the planned procedure without further cardiac work up.  Rate control might be difficult if he is in atrial fib.   The long term plan is ablation.  We would be happy to assist with him at the time of the the procedure if rate control is an issue.  He likely will have the BP to use IV Cardizem or short acting or IV beta blocker.  Thanks."   VS: BP 126/74   Pulse 63   Temp 36.9 C (Oral)   Resp 14   Ht 6\' 2"  (1.88 m)   Wt 89.8 kg   SpO2 98%   BMI 25.42 kg/m   PROVIDERS: Patient, No Pcp Per  Cardiologist - Rollene Rotunda, MD  LABS: Labs reviewed: Acceptable for surgery. (all labs ordered are listed, but only abnormal results are displayed)  Labs Reviewed - No data to display   IMAGES:   EKG:   CV: Echo 03/09/2023 1. Left ventricular ejection fraction, by estimation, is 45 to 50%. The  left ventricle has mildly decreased function. There is severe left  ventricular hypertrophy.   2. Right ventricular systolic function is low normal. The right  ventricular size is mildly enlarged.   3. Left atrial size was mildly dilated. No left atrial/left atrial  appendage  thrombus was detected.   4. Right atrial size was mildly dilated.   5. A small pericardial effusion is present. The pericardial effusion is  localized near the right atrium.   6. The mitral valve is normal in structure. Mild to moderate mitral valve  regurgitation. No evidence of mitral stenosis.   7. The aortic valve is tricuspid. Aortic valve regurgitation is not  visualized. No aortic stenosis is present.   8. Mildly dilated pulmonary artery.   9. The inferior vena cava is normal in size with greater than 50%  respiratory variability, suggesting right atrial pressure of 3 mmHg.  Past Medical History:  Diagnosis Date   Arthritis    Atrial fibrillation (HCC)    last episode was 02/10/13   GERD (gastroesophageal reflux disease)    Gilbert's disease    History of kidney stones    Spinal headache     Past Surgical History:  Procedure Laterality Date   CARDIOVERSION N/A 03/09/2023   Procedure: CARDIOVERSION;  Surgeon: Christell Constant, MD;  Location: [facility];  Service: Cardiovascular;  Laterality: N/A;   CARDIOVERSION N/A 03/12/2023   Procedure: CARDIOVERSION;  Surgeon: Jake Bathe, MD;  Location: [facility];  Service: Cardiovascular;  Laterality: N/A;   CHOLECYSTECTOMY  05/18/2012   Procedure: LAPAROSCOPIC CHOLECYSTECTOMY WITH INTRAOPERATIVE CHOLANGIOGRAM;  Surgeon: Valarie Merino, MD;  Location: [facility];  Service: General;  Laterality: N/A;   COLONOSCOPY  10/08/2019   2005, 2010   CYSTOSCOPY/URETEROSCOPY/HOLMIUM LASER/STENT PLACEMENT Bilateral 03/22/2023   Procedure: BILATERAL CYSTOSCOPY RETROGRADE, RIGHT URETEROSCOPY WITH HOLMIUM LASER, BILATERAL STENT PLACEMENT;  Surgeon: Milderd Meager., MD;  Location: [facility];  Service: Urology;  Laterality: Bilateral;   ERCP  07/11/2012   Procedure: ENDOSCOPIC RETROGRADE CHOLANGIOPANCREATOGRAPHY (ERCP);  Surgeon: Louis Meckel, MD;  Location: [facility];  Service: Endoscopy;  Laterality: N/A;   Hernia repairs      x 3    INGUINAL HERNIA REPAIR     bilateral   LAPAROSCOPIC LYSIS OF ADHESIONS  06/24/2012   Procedure: LAPAROSCOPIC LYSIS OF ADHESIONS;  Surgeon: Crecencio Mc, MD;  Location: [facility];  Service: Urology;;   ROBOTIC ASSITED PARTIAL NEPHRECTOMY  06/24/2012   Procedure: ROBOTIC ASSITED PARTIAL NEPHRECTOMY;  Surgeon: Crecencio Mc, MD;  Location: [facility];  Service: Urology;  Laterality: Right;   TEE WITHOUT CARDIOVERSION N/A 03/09/2023   Procedure: TRANSESOPHAGEAL ECHOCARDIOGRAM;  Surgeon: Christell Constant, MD;  [facility];  Service: Cardiovascular;  Laterality: N/A;   UPPER GASTROINTESTINAL ENDOSCOPY  10/08/2019   2005,2010,2013    MEDICATIONS:  amiodarone (PACERONE) 200 MG tablet   apixaban (ELIQUIS) 5 MG TABS tablet   cefdinir (OMNICEF) 300 MG capsule   digoxin (LANOXIN) 0.25 MG tablet   metoprolol tartrate (LOPRESSOR) 100 MG tablet   oxyCODONE (OXY IR/ROXICODONE) 5 MG immediate release tablet   sulfamethoxazole-trimethoprim (BACTRIM DS) 800-160 MG tablet   tamsulosin (FLOMAX) 0.4 MG CAPS capsule   No current facility-administered medications for this encounter.    Jodell Cipro Ward, PA-C [facility] [phone]

## 2023-04-04 NOTE — Telephone Encounter (Signed)
-----   Message from Rollene Rotunda sent at 04/03/2023 10:58 AM EDT ----- Dr. Pete Glatter, if you want to put this in your note for preop.  The patient is OK to hold his Eliquis for two days prior to the procedure.  He is at acceptable risk for the planned procedure without further cardiac work up.  Rate control might be difficult if he is in atrial fib.   The long term plan is ablation.  We would be happy to assist with him at the time of the the procedure if rate control is an issue.  He likely will have the BP to use IV Cardizem or short acting or IV beta blocker.  Thanks.  Leta Jungling ----- Message ----- From: Milderd Meager., MD Sent: 03/29/2023  12:23 PM EDT To: Rollene Rotunda, MD  Dr. Antoine Poche,  I saw Kevin Doyle today for follow-up of his ureteral calculi.  He currently has a left ureteral stent in place with a proximal stone needing definitive treatment.  I recommended ureteroscopy with laser lithotripsy for management.  If possible, I would like to get this scheduled for 04/10/23 if he is stable from a cardiac standpoint.  I understand he is in the process of scheduling some cardiology procedures for management of his a. Fib.  Also, would it be possible for him to hold his Eliquis prior to the surgery and resume post-operatively?  Thank you,  Rosilyn Mings

## 2023-04-07 LAB — CALCULI, WITH PHOTOGRAPH (CLINICAL LAB)
Calcium Oxalate Dihydrate: 10 %
Calcium Oxalate Monohydrate: 90 %
Weight Calculi: 36 mg

## 2023-04-09 ENCOUNTER — Telehealth: Payer: Self-pay | Admitting: Urology

## 2023-04-09 NOTE — Telephone Encounter (Signed)
Pt is having surgery tomorrow and wants to know if he can eat at all before procedure that starts at 4pm. Or if he can eat at any time after midnight tonight.

## 2023-04-09 NOTE — Telephone Encounter (Signed)
Called [facility] and spoke with Mardella Layman. She is going to call patient and verify with him his instructions prior to his surgery tomorrow.

## 2023-04-10 ENCOUNTER — Ambulatory Visit (HOSPITAL_COMMUNITY)
Admission: RE | Admit: 2023-04-10 | Discharge: 2023-04-10 | Disposition: A | Discharge status: Home / Self Care | Payer: Medicare Other | Source: Ambulatory Visit | Attending: Urology | Admitting: Urology

## 2023-04-10 ENCOUNTER — Encounter (HOSPITAL_COMMUNITY)
Admission: RE | Disposition: A | Discharge status: Home / Self Care | Payer: Self-pay | Source: Ambulatory Visit | Attending: Urology

## 2023-04-10 ENCOUNTER — Encounter (HOSPITAL_COMMUNITY): Payer: Self-pay | Admitting: Urology

## 2023-04-10 ENCOUNTER — Ambulatory Visit (HOSPITAL_BASED_OUTPATIENT_CLINIC_OR_DEPARTMENT_OTHER): Payer: Medicare Other | Admitting: Anesthesiology

## 2023-04-10 ENCOUNTER — Ambulatory Visit (HOSPITAL_COMMUNITY): Payer: Medicare Other | Admitting: Physician Assistant

## 2023-04-10 ENCOUNTER — Ambulatory Visit (HOSPITAL_COMMUNITY): Payer: Medicare Other

## 2023-04-10 DIAGNOSIS — N202 Calculus of kidney with calculus of ureter: Secondary | ICD-10-CM

## 2023-04-10 DIAGNOSIS — I509 Heart failure, unspecified: Secondary | ICD-10-CM | POA: Diagnosis not present

## 2023-04-10 DIAGNOSIS — I4891 Unspecified atrial fibrillation: Secondary | ICD-10-CM | POA: Diagnosis not present

## 2023-04-10 DIAGNOSIS — Z905 Acquired absence of kidney: Secondary | ICD-10-CM | POA: Insufficient documentation

## 2023-04-10 DIAGNOSIS — N201 Calculus of ureter: Secondary | ICD-10-CM | POA: Diagnosis not present

## 2023-04-10 DIAGNOSIS — N1330 Unspecified hydronephrosis: Secondary | ICD-10-CM

## 2023-04-10 DIAGNOSIS — Z7901 Long term (current) use of anticoagulants: Secondary | ICD-10-CM | POA: Insufficient documentation

## 2023-04-10 DIAGNOSIS — K219 Gastro-esophageal reflux disease without esophagitis: Secondary | ICD-10-CM | POA: Diagnosis not present

## 2023-04-10 DIAGNOSIS — Z9049 Acquired absence of other specified parts of digestive tract: Secondary | ICD-10-CM | POA: Insufficient documentation

## 2023-04-10 DIAGNOSIS — R8290 Unspecified abnormal findings in urine: Secondary | ICD-10-CM | POA: Insufficient documentation

## 2023-04-10 DIAGNOSIS — Z87891 Personal history of nicotine dependence: Secondary | ICD-10-CM | POA: Diagnosis not present

## 2023-04-10 HISTORY — PX: CYSTOSCOPY WITH RETROGRADE PYELOGRAM, URETEROSCOPY AND STENT PLACEMENT: SHX5789

## 2023-04-10 SURGERY — CYSTOURETEROSCOPY, WITH RETROGRADE PYELOGRAM AND STENT INSERTION
Anesthesia: General | Laterality: Left

## 2023-04-10 MED ORDER — ONDANSETRON HCL 4 MG/2ML IJ SOLN
INTRAMUSCULAR | Status: DC | PRN
  Administered 2023-04-10: 4 mg via INTRAVENOUS

## 2023-04-10 MED ORDER — ONDANSETRON HCL 4 MG/2ML IJ SOLN
INTRAMUSCULAR | Status: AC
  Filled 2023-04-10: qty 2

## 2023-04-10 MED ORDER — DEXAMETHASONE SODIUM PHOSPHATE 10 MG/ML IJ SOLN
INTRAMUSCULAR | Status: AC
  Filled 2023-04-10: qty 1

## 2023-04-10 MED ORDER — PHENYLEPHRINE 80 MCG/ML (10ML) SYRINGE FOR IV PUSH (FOR BLOOD PRESSURE SUPPORT)
PREFILLED_SYRINGE | INTRAVENOUS | Status: AC
  Filled 2023-04-10: qty 20

## 2023-04-10 MED ORDER — LIDOCAINE HCL (PF) 2 % IJ SOLN
INTRAMUSCULAR | Status: AC
  Filled 2023-04-10: qty 5

## 2023-04-10 MED ORDER — IOHEXOL 300 MG/ML  SOLN
INTRAMUSCULAR | Status: DC | PRN
  Administered 2023-04-10: 3 mL

## 2023-04-10 MED ORDER — DEXAMETHASONE SODIUM PHOSPHATE 10 MG/ML IJ SOLN
INTRAMUSCULAR | Status: DC | PRN
  Administered 2023-04-10: 5 mg via INTRAVENOUS

## 2023-04-10 MED ORDER — EPHEDRINE SULFATE (PRESSORS) 50 MG/ML IJ SOLN
INTRAMUSCULAR | Status: DC | PRN
Start: 2023-04-10 — End: 2023-04-10
  Administered 2023-04-10 (×2): 10 mg via INTRAVENOUS

## 2023-04-10 MED ORDER — LIDOCAINE HCL URETHRAL/MUCOSAL 2 % EX GEL
CUTANEOUS | Status: AC
  Filled 2023-04-10: qty 30

## 2023-04-10 MED ORDER — LACTATED RINGERS IV SOLN: INTRAVENOUS | Status: DC

## 2023-04-10 MED ORDER — PROPOFOL 10 MG/ML IV BOLUS
INTRAVENOUS | Status: DC | PRN
  Administered 2023-04-10: 150 mg via INTRAVENOUS

## 2023-04-10 MED ORDER — PHENYLEPHRINE HCL (PRESSORS) 10 MG/ML IV SOLN
INTRAVENOUS | Status: DC | PRN
Start: 2023-04-10 — End: 2023-04-10
  Administered 2023-04-10 (×5): 80 ug via INTRAVENOUS

## 2023-04-10 MED ORDER — FENTANYL CITRATE (PF) 100 MCG/2ML IJ SOLN
INTRAMUSCULAR | Status: AC
  Filled 2023-04-10: qty 2

## 2023-04-10 MED ORDER — CHLORHEXIDINE GLUCONATE 0.12 % MT SOLN
15.0000 mL | Freq: Once | OROMUCOSAL | Status: AC
  Administered 2023-04-10: 15 mL via OROMUCOSAL

## 2023-04-10 MED ORDER — LIDOCAINE HCL (CARDIAC) PF 100 MG/5ML IV SOSY
PREFILLED_SYRINGE | INTRAVENOUS | Status: DC | PRN
  Administered 2023-04-10: 80 mg via INTRAVENOUS

## 2023-04-10 MED ORDER — LIDOCAINE HCL 2 % EX GEL
CUTANEOUS | Status: DC | PRN
Start: 2023-04-10 — End: 2023-04-11
  Administered 2023-04-10: 1

## 2023-04-10 MED ORDER — ORAL CARE MOUTH RINSE: 15.0000 mL | Freq: Once | OROMUCOSAL | Status: AC

## 2023-04-10 MED ORDER — EPHEDRINE 5 MG/ML INJ
INTRAVENOUS | Status: AC
  Filled 2023-04-10: qty 10

## 2023-04-10 MED ORDER — FENTANYL CITRATE (PF) 100 MCG/2ML IJ SOLN
INTRAMUSCULAR | Status: DC | PRN
  Administered 2023-04-10: 25 ug via INTRAVENOUS
  Administered 2023-04-10: 50 ug via INTRAVENOUS

## 2023-04-10 MED ORDER — CEFAZOLIN SODIUM-DEXTROSE 2-4 GM/100ML-% IV SOLN
2.0000 g | INTRAVENOUS | Status: AC
  Administered 2023-04-10: 2 g via INTRAVENOUS

## 2023-04-10 MED ORDER — SODIUM CHLORIDE 0.9 % IR SOLN
Status: DC | PRN
  Administered 2023-04-10: 3000 mL

## 2023-04-10 MED ORDER — OXYCODONE HCL 5 MG PO TABS: 5.0000 mg | ORAL_TABLET | Freq: Four times a day (QID) | ORAL | 0 refills | Status: DC | PRN

## 2023-04-10 MED ORDER — SULFAMETHOXAZOLE-TRIMETHOPRIM 800-160 MG PO TABS
1.0000 | ORAL_TABLET | Freq: Every day | ORAL | 0 refills | Status: AC
Start: 2023-04-10 — End: 2023-04-15

## 2023-04-10 MED ORDER — LACTATED RINGERS IV SOLN: INTRAVENOUS | Status: DC | PRN

## 2023-04-10 SURGICAL SUPPLY — 22 items
BAG URO CATCHER STRL LF (MISCELLANEOUS) ×1 IMPLANT
BASKET LASER NITINOL 1.9FR (BASKET) IMPLANT
BASKET ZERO TIP NITINOL 2.4FR (BASKET) IMPLANT
BSKT STON RTRVL 120 1.9FR (BASKET) ×1
BSKT STON RTRVL ZERO TP 2.4FR (BASKET)
CATH URETL OPEN 5X70 (CATHETERS) IMPLANT
CLOTH BEACON ORANGE TIMEOUT ST (SAFETY) ×1 IMPLANT
GLOVE SS BIOGEL STRL SZ 8 (GLOVE) ×1 IMPLANT
GOWN SPEC L4 XLG W/TWL (GOWN DISPOSABLE) ×1 IMPLANT
GUIDEWIRE STR DUAL SENSOR (WIRE) ×1 IMPLANT
GUIDEWIRE ZIPWRE .038 STRAIGHT (WIRE) IMPLANT
KIT TURNOVER KIT A (KITS) IMPLANT
LASER FIB FLEXIVA PULSE ID 365 (Laser) IMPLANT
MANIFOLD NEPTUNE II (INSTRUMENTS) ×1 IMPLANT
PACK CYSTO (CUSTOM PROCEDURE TRAY) ×1 IMPLANT
SHEATH NAVIGATOR HD 12/14X36 (SHEATH) IMPLANT
SHEATH URETERAL FLEX 12X35 (SHEATH) IMPLANT
STENT URET 6FRX28 CONTOUR (STENTS) IMPLANT
TRACTIP FLEXIVA PULS ID 200XHI (Laser) IMPLANT
TRACTIP FLEXIVA PULSE ID 200 (Laser) ×1
TUBING CONNECTING 10 (TUBING) ×1 IMPLANT
TUBING UROLOGY SET (TUBING) ×1 IMPLANT

## 2023-04-10 NOTE — Op Note (Signed)
OPERATIVE NOTE   Patient Name: Kevin Doyle  MRN: [id number]   Date of Procedure: 04/10/23  Preoperative diagnosis:  Left ureteral calculus  Postoperative diagnosis:  Left ureteral calculus  Procedure:  Cystoscopy Left retrograde pyelogram with intraoperative interpretation Left ureteroscopy with holmium laser lithotripsy Exchange of left ureteral stent (6 French by 28 cm, with tether)  Attending: Milderd Meager, MD  Anesthesia: General  Estimated blood loss:  5 ml  Fluids: Per anesthesia record  Drains: 50F x 28 cm left ureteral stent, with tether  Specimens: Stone fragments given to patient  Antibiotics: Ancef 2 g IV  Findings: 8 x 10 mm calculus in the proximal left ureter; 3 mm calculus in the left kidney  Indications:  72 year old male who initially presented to the emergency room on 03/22/2023 with left flank pain.  He was found to have bilateral ureteral calculi.  CT imaging showed a 10 mm calculus in the proximal left ureter with associated obstruction and a 9 mm calculus in the right distal ureter with obstruction.  His creatinine was slightly increased to 1.9.  He was taken to the operating room on 03/22/2023 for cystoscopy, bilateral retrograde pyelograms, right ureteroscopy with laser lithotripsy and insertion of bilateral ureteral stents.  The right ureteral calculus was removed at that time.  His right ureteral stent was removed approximately 5 days postop.  He presents now for definitive management of the left proximal ureteral calculus.  He discontinued his Eliquis 2 days ago in preparation for the procedure.  Risks, benefits, alternatives were discussed with the patient in detail.  Potential risks including, but not limited to, infection; bleeding;  injury to urethra, bladder, or ureter; possible need of other treatments; possible failure to remove the calculus; ureteral  stricture formation; cardiac, pulmonary, cerebrovascular events; and anesthetic  complications were discussed.  The patient understands and wishes to proceed.   Description of Procedure:  The patient received IV Ancef preoperatively.  The patient was brought to the operating room suite and properly identified.  After successful induction of a general anesthetic, the patient was placed in the dorsolithotomy position.  The patient's genital area was prepped and draped in sterile fashion.  Under direct visualization a 21 French rigid cystoscope was passed through the urethra and into the bladder.  Mild enlargement of the prostate lateral lobes was noted.  A left ureteral stent was seen emanating from the left ureteral orifice.  No other mucosal lesions were seen.  Using stent graspers, the stent was brought to the urethral meatus.  A sensor guidewire was then passed through the stent and into the left renal pelvis under fluoroscopic guidance.  A 5 French open-ended catheter was then passed over the guidewire.  Scout film demonstrated the wire in the left renal pelvis as well as an 8 x 10 mm calcification alongside the guidewire in the proximal left ureter.  Contrast was injected into the left proximal ureter and left collecting system.  A bifid left collecting system was noted. The sensor guidewire was replaced.  A 11/13 French Flexor parallel access sheath was then placed leaving the guidewire in place.  Flexible ureteroscopy was performed using the dual-lumen ureteroscope.  The stone was identified in the proximal left ureter.  Using the holmium laser fiber, the stone was fragmented into multiple pieces.  During fragmentation, a majority of the stone fragments retropulsed into the lower pole calyx.  Fragmentation was continued using a dusting setting on these fragments in the lower pole.  During inspection  of the remaining calyces, a 3 mm calculus was noted and this was fragmented as well.  I attempted to extract some of the fragments with a escape stone basket.  I was able to extract a  couple of fragments.  I used the holmium laser fiber with the dusting setting to further fragment these pieces into fragments that were felt to be passable.  I elected not to proceed with further attempts at basketing the stone fragments to avoid any trauma to the collecting system.  Inspection of the upper pole calyces did not reveal any stone fragments.  The ureteroscope was removed with inspection of the ureter.  There was no evidence of any ureteral injury.  A 6 French by 28 cm double-J stent was then passed over the guidewire and into the left kidney under cystoscopic and fluoroscopic guidance.  The tether was left attached and brought through the meatus.  The bladder was then drained and the cystoscope was removed.  Intraurethral lidocaine jelly was placed.  Complications: None  Condition: Stable, extubated, transferred to PACU  Plan:  Discharge to home Continue stent for 1 week.

## 2023-04-10 NOTE — Transfer of Care (Signed)
Immediate Anesthesia Transfer of Care Note  Patient: Kevin Doyle  Procedure(s) Performed: CYSTOSCOPY WITH RETROGRADE PYELOGRAM, URETEROSCOPY AND STENT PLACEMENT (Left)  Patient Location: PACU  Anesthesia Type:General  Level of Consciousness: awake, alert , and oriented  Airway & Oxygen Therapy: Patient Spontanous Breathing and Patient connected to face mask oxygen  Post-op Assessment: Report given to RN  Post vital signs: Reviewed and stable  Last Vitals:  Vitals Value Taken Time  BP    Temp    Pulse    Resp    SpO2      Last Pain:  Vitals:   04/10/23 1405  TempSrc: Oral  PainSc:       Patients Stated Pain Goal: 3 (04/10/23 1401)  Complications: No notable events documented.

## 2023-04-10 NOTE — Anesthesia Procedure Notes (Signed)
Procedure Name: LMA Insertion Date/Time: 04/10/2023 5:22 PM  Performed by: Deri Fuelling, CRNAPre-anesthesia Checklist: Patient identified, Emergency Drugs available, Suction available and Patient being monitored Patient Re-evaluated:Patient Re-evaluated prior to induction Oxygen Delivery Method: Circle system utilized Preoxygenation: Pre-oxygenation with 100% oxygen Induction Type: IV induction Ventilation: Mask ventilation without difficulty LMA: LMA inserted LMA Size: 5.0 Tube type: Oral Number of attempts: 1 Airway Equipment and Method: Stylet and Oral airway Placement Confirmation: ETT inserted through vocal cords under direct vision, positive ETCO2 and breath sounds checked- equal and bilateral Tube secured with: Tape Dental Injury: Teeth and Oropharynx as per pre-operative assessment

## 2023-04-10 NOTE — Anesthesia Postprocedure Evaluation (Signed)
Anesthesia Post Note  Patient: Kevin Doyle  Procedure(s) Performed: CYSTOSCOPY WITH RETROGRADE PYELOGRAM, URETEROSCOPY AND STENT PLACEMENT WITH LITHOTRIPSY (Left)     Patient location during evaluation: PACU Anesthesia Type: General Level of consciousness: sedated and patient cooperative Pain management: pain level controlled Vital Signs Assessment: post-procedure vital signs reviewed and stable Respiratory status: spontaneous breathing Cardiovascular status: stable Anesthetic complications: no   No notable events documented.  Last Vitals:  Vitals:   04/10/23 1815 04/10/23 1835  BP: 133/79 (!) 145/77  Pulse: 91 (!) 101  Resp: 15 16  Temp:  36.4 C  SpO2: 96% 100%    Last Pain:  Vitals:   04/10/23 1835  TempSrc:   PainSc: 0-No pain                 [name]

## 2023-04-10 NOTE — Interval H&P Note (Signed)
History and Physical Interval Note:  04/10/2023 2:43 PM  Kevin Doyle  has presented today for surgery, with the diagnosis of left ureteral stone.  The various methods of treatment have been discussed with the patient and family. After consideration of risks, benefits and other options for treatment, the patient has consented to  Procedure(s): CYSTOSCOPY WITH RETROGRADE PYELOGRAM, URETEROSCOPY AND STENT PLACEMENT (Left) as a surgical intervention.  The patient's history has been reviewed, patient examined, no change in status, stable for surgery.  I have reviewed the patient's chart and labs.  Questions were answered to the patient's satisfaction.     Di Kindle

## 2023-04-11 ENCOUNTER — Encounter (HOSPITAL_COMMUNITY): Payer: Self-pay | Admitting: Urology

## 2023-04-11 NOTE — Progress Notes (Unsigned)
Electrophysiology Office Note:   Date:  04/11/2023  ID:  Kevin Doyle, DOB [birth year], MRN [id number]  Primary Cardiologist: Rollene Rotunda, MD Electrophysiologist: Nobie Putnam, MD  {Click to update primary MD,subspecialty MD or APP then REFRESH:1}    History of Present Illness:   Kevin Doyle is a 72 y.o. male with h/o bilateral ureteral calculi, GERD and chronic systolic heart failure with midrange ejection fraction (45 to 50%) who is seen today for evaluation of his atrial fibrillation.   Patient is first episode of atrial fibrillation in 1998, then again had a repeat isolated episode in 2015.  He was recently admitted to the hospital 03/07/2023 to 03/12/2023 for atrial fibrillation with rapid ventricular response, during which he failed an initial attempt at cardioversion then was started on amiodarone and underwent subsequent cardioversion that was successful.  Review of systems complete and found to be negative unless listed in HPI.   EP Information / Studies Reviewed:    {EKGtoday:28818}     03/07/23 EKG:    03/08/23 Echocardiogram:  Mildly decreased LV function.  LVEF 45 to 50% with global hypokinesis.  Mild to moderately dilated LV. Normal RV size and systolic function. No significant valvular disease. Left and right atria are normal in size.  Risk Assessment/Calculations:   {Does this patient have ATRIAL FIBRILLATION?:[phone]}          Physical Exam:   VS:  There were no vitals taken for this visit.   Wt Readings from Last 3 Encounters:  04/10/23 198 lb (89.8 kg)  04/03/23 198 lb (89.8 kg)  03/29/23 198 lb (89.8 kg)     GEN: Well nourished, well developed in no acute distress NECK: No JVD; No carotid bruits CARDIAC: {EPRHYTHM:28826}, no murmurs, rubs, gallops RESPIRATORY:  Clear to auscultation without rales, wheezing or rhonchi  ABDOMEN: Soft, non-tender, non-distended EXTREMITIES:  No edema; No deformity   ASSESSMENT AND PLAN:   Kevin Doyle  is a 72 y.o. male with h/o bilateral ureteral calculi, GERD and chronic systolic heart failure with midrange ejection fraction (45 to 50%) who is seen today for evaluation of his atrial fibrillation.   #. Persistent atrial fibrillation: It is possible patient could still be paroxysmal, but the fact that he failed electrical cardioversion makes me more suspicious for progression to her persistent nature. -Discussed treatment options today for AF including antiarrhythmic drug therapy and ablation. Discussed risks, recovery and likelihood of success with each treatment strategy. Risk, benefits, and alternatives to EP study and ablation for afib were discussed. These risks include but are not limited to stroke, bleeding, vascular damage, tamponade, perforation, damage to the esophagus, lungs, phrenic nerve and other structures, pulmonary vein stenosis, worsening renal function, coronary vasospasm and death.  Discussed potential need for repeat ablation procedures and antiarrhythmic drugs after an initial ablation. The patient understands these risk and wishes to proceed.  We will therefore proceed with catheter ablation at the next available time.  Carto, ICE, anesthesia are requested for the procedure.  Will also obtain CT PV protocol prior to the procedure to exclude LAA thrombus and further evaluate atrial anatomy.  #.  Secondary hypercoagulable state due to atrial fibrillation  #.  Bilateral renal calculi and hematuria: -If this continues to be a problem, then patient would be reasonable candidate for Watchman device implantation in efforts to discontinue therapeutic oral anticoagulation.  #.  Chronic systolic heart failure with midrange ejection fraction: This is likely likely secondary to an arrhythmia induced cardiomyopathy as her only  echocardiograms were done during atrial fibrillation.  Follow up with {EXBMW:41324} {EPFOLLOW MW:10272}  Signed, Nobie Putnam, MD

## 2023-04-12 ENCOUNTER — Encounter: Payer: Self-pay | Admitting: Cardiology

## 2023-04-12 ENCOUNTER — Ambulatory Visit: Payer: Medicare Other | Attending: Cardiology | Admitting: Cardiology

## 2023-04-12 VITALS — BP 128/74 | HR 61 | Ht 74.0 in | Wt 199.0 lb

## 2023-04-12 DIAGNOSIS — I4819 Other persistent atrial fibrillation: Secondary | ICD-10-CM

## 2023-04-12 DIAGNOSIS — D6869 Other thrombophilia: Secondary | ICD-10-CM

## 2023-04-12 DIAGNOSIS — R310 Gross hematuria: Secondary | ICD-10-CM

## 2023-04-12 MED ORDER — METOPROLOL TARTRATE 50 MG PO TABS: 50.0000 mg | ORAL_TABLET | Freq: Two times a day (BID) | ORAL | 3 refills | Status: DC

## 2023-04-12 MED ORDER — METOPROLOL TARTRATE 50 MG PO TABS: 50.0000 mg | ORAL_TABLET | Freq: Every day | ORAL | 3 refills | Status: DC

## 2023-04-12 NOTE — Addendum Note (Signed)
Addended by: Frutoso Schatz on: 04/12/2023 12:54 PM   Modules accepted: Orders

## 2023-04-12 NOTE — Patient Instructions (Addendum)
Medication Instructions:  Your physician has recommended you make the following change in your medication:  1) DECREASE metoprolol to 50 mg twice daily  *If you need a refill on your cardiac medications before your next appointment, please call your pharmacy*  Lab Work: BMET and CBC on 10/21   If you have labs (blood work) drawn today and your tests are completely normal, you will receive your results only by: MyChart Message (if you have MyChart) OR A paper copy in the mail If you have any lab test that is abnormal or we need to change your treatment, we will call you to review the results.  Testing/Procedures: Your physician has requested that you have cardiac CT. Cardiac computed tomography (CT) is a painless test that uses an x-ray machine to take clear, detailed pictures of your heart. For further information please visit [email or web address] Please follow instruction sheet as given.  Your physician has recommended that you have an ablation. Catheter ablation is a medical procedure used to treat some cardiac arrhythmias (irregular heartbeats). During catheter ablation, a long, thin, flexible tube is put into a blood vessel in your groin (upper thigh), or neck. This tube is called an ablation catheter. It is then guided to your heart through the blood vessel. Radio frequency waves destroy small areas of heart tissue where abnormal heartbeats may cause an arrhythmia to start. Please see the instruction sheet given to you today.  Follow-Up: At [facility], you and your health needs are our priority.  As part of our continuing mission to provide you with exceptional heart care, we have created designated Provider Care Teams.  These Care Teams include your primary Cardiologist (physician) and Advanced Practice Providers (APPs -  Physician Assistants and Nurse Practitioners) who all work together to provide you with the care you need, when you need it.  Your next appointment:   We will  contact you to schedule your follow up appointments.

## 2023-04-17 ENCOUNTER — Ambulatory Visit (INDEPENDENT_AMBULATORY_CARE_PROVIDER_SITE_OTHER): Payer: Medicare Other | Admitting: Urology

## 2023-04-17 VITALS — BP 150/74 | HR 63 | Ht 74.0 in | Wt 191.0 lb

## 2023-04-17 DIAGNOSIS — Z09 Encounter for follow-up examination after completed treatment for conditions other than malignant neoplasm: Secondary | ICD-10-CM | POA: Diagnosis not present

## 2023-04-17 DIAGNOSIS — Z87442 Personal history of urinary calculi: Secondary | ICD-10-CM | POA: Diagnosis not present

## 2023-04-17 DIAGNOSIS — N201 Calculus of ureter: Secondary | ICD-10-CM

## 2023-04-17 NOTE — Progress Notes (Signed)
Assessment: 1. Ureteral calculus, left     Plan: Left ureteral stent removed today Continue tamsulosin Complete antibiotics Return to office in 3 weeks with KUB  Chief Complaint: Chief Complaint  Patient presents with   Nephrolithiasis    HPI: Kevin Doyle is a 72 y.o. male who presents for continued evaluation of bilateral ureteral calculi. He presented to the emergency room on 9/5 with worsening left-sided flank pain.  He had intermittent flank pain for some time.  No prior history of kidney stones.  CT imaging showed a 10 mm calculus in the proximal left ureter with associated obstruction and a 9 mm calculus in the right distal ureter without obstructive changes.  His creatinine was slightly increased to 1.9 from recent level of 1.32 1 week prior.  No fevers or chills.  Urinalysis showed 0-5 RBCs, 0-5 WBCs, and rare bacteria. He was taken to the OR on 03/22/23 for cystoscopy, bilateral retrograde pyelograms, right ureteroscopy with laser lithotripsy and insertion of bilateral ureteral stents.  The right distal ureteral calculus was removed. He had bilateral ureteral stents, right stent with tether.   He removed the right ureteral stent.  Stone analysis showed 9% calcium oxalate monohydrate and 10% calcium oxalate dihydrate.  He went left ureteroscopic laser lithotripsy on 04/10/2023. The proximal left ureteral calculus was fragmented.  He has a left ureteral stent with a tether in place. He continues on his antibiotics.  No fevers or chills.   Portions of the above documentation were copied from a prior visit for review purposes only.  Allergies: No Known Allergies  PMH: Past Medical History:  Diagnosis Date   Arthritis    Atrial fibrillation (HCC)    last episode was 02/10/13   GERD (gastroesophageal reflux disease)    Gilbert's disease    History of kidney stones    Spinal headache     PSH: Past Surgical History:  Procedure Laterality Date   CARDIOVERSION  N/A 03/09/2023   Procedure: CARDIOVERSION;  Surgeon: Christell Constant, MD;  Location: [facility];  Service: Cardiovascular;  Laterality: N/A;   CARDIOVERSION N/A 03/12/2023   Procedure: CARDIOVERSION;  Surgeon: Jake Bathe, MD;  Location: [facility];  Service: Cardiovascular;  Laterality: N/A;   CHOLECYSTECTOMY  05/18/2012   Procedure: LAPAROSCOPIC CHOLECYSTECTOMY WITH INTRAOPERATIVE CHOLANGIOGRAM;  Surgeon: Valarie Merino, MD;  Location: [facility];  Service: General;  Laterality: N/A;   COLONOSCOPY  10/08/2019   2005, 2010   CYSTOSCOPY WITH RETROGRADE PYELOGRAM, URETEROSCOPY AND STENT PLACEMENT Left 04/10/2023   Procedure: CYSTOSCOPY WITH RETROGRADE PYELOGRAM, URETEROSCOPY AND STENT PLACEMENT WITH LITHOTRIPSY;  Surgeon: Milderd Meager., MD;  Location: [facility];  Service: Urology;  Laterality: Left;   CYSTOSCOPY/URETEROSCOPY/HOLMIUM LASER/STENT PLACEMENT Bilateral 03/22/2023   Procedure: BILATERAL CYSTOSCOPY RETROGRADE, RIGHT URETEROSCOPY WITH HOLMIUM LASER, BILATERAL STENT PLACEMENT;  Surgeon: Milderd Meager., MD;  Location: [facility];  Service: Urology;  Laterality: Bilateral;   ERCP  07/11/2012   Procedure: ENDOSCOPIC RETROGRADE CHOLANGIOPANCREATOGRAPHY (ERCP);  Surgeon: Louis Meckel, MD;  Location: [facility];  Service: Endoscopy;  Laterality: N/A;   Hernia repairs     x 3    INGUINAL HERNIA REPAIR     bilateral   LAPAROSCOPIC LYSIS OF ADHESIONS  06/24/2012   Procedure: LAPAROSCOPIC LYSIS OF ADHESIONS;  Surgeon: Crecencio Mc, MD;  Location: [facility];  Service: Urology;;   ROBOTIC ASSITED PARTIAL NEPHRECTOMY  06/24/2012   Procedure: ROBOTIC ASSITED PARTIAL NEPHRECTOMY;  Surgeon: Crecencio Mc, MD;  Location:  [facility];  Service: Urology;  Laterality: Right;   TEE WITHOUT CARDIOVERSION N/A 03/09/2023   Procedure: TRANSESOPHAGEAL ECHOCARDIOGRAM;  Surgeon: Christell Constant, MD;  Location: [facility];  Service: Cardiovascular;  Laterality: N/A;   UPPER  GASTROINTESTINAL ENDOSCOPY  10/08/2019   2005,2010,2013    SH: Social History   Tobacco Use   Smoking status: Former   Smokeless tobacco: Never   Tobacco comments:    Quit at age 38   Vaping Use   Vaping status: Never Used  Substance Use Topics   Alcohol use: Yes    Comment: Rare-Beer    Drug use: No    ROS: Constitutional:  Negative for fever, chills, weight loss CV: Negative for chest pain, previous MI, hypertension Respiratory:  Negative for shortness of breath, wheezing, sleep apnea, frequent cough GI:  Negative for nausea, vomiting, bloody stool, GERD  PE: BP (!) 150/74   Pulse 63   Ht 6\' 2"  (1.88 m)   Wt 191 lb (86.6 kg)   BMI 24.52 kg/m  GENERAL APPEARANCE:  Well appearing, well developed, well nourished, NAD HEENT:  Atraumatic, normocephalic, oropharynx clear NECK:  Supple without lymphadenopathy or thyromegaly ABDOMEN:  Soft, non-tender, no masses EXTREMITIES:  Moves all extremities well, without clubbing, cyanosis, or edema NEUROLOGIC:  Alert and oriented x 3, normal gait, CN II-XII grossly intact MENTAL STATUS:  appropriate BACK:  Non-tender to palpation, No CVAT SKIN:  Warm, dry, and intact   Results: U/A: 6-10 WBC, >30 RBC

## 2023-04-18 LAB — URINALYSIS, ROUTINE W REFLEX MICROSCOPIC
Bilirubin, UA: NEGATIVE
Glucose, UA: NEGATIVE
Ketones, UA: NEGATIVE
Nitrite, UA: NEGATIVE
Specific Gravity, UA: 1.025 (ref 1.005–1.030)
Urobilinogen, Ur: 1 mg/dL (ref 0.2–1.0)
pH, UA: 6 (ref 5.0–7.5)

## 2023-04-18 LAB — MICROSCOPIC EXAMINATION: RBC, Urine: >30 /[HPF] — ABNORMAL HIGH (ref 0–2)

## 2023-04-30 ENCOUNTER — Telehealth: Payer: Self-pay | Admitting: Cardiology

## 2023-04-30 NOTE — Telephone Encounter (Signed)
Spoke with the patient who states that he has been feeling sluggish since starting on medications for A Fib. He states that he gets fatigued quicker than he used to. He recently saw Dr. Jimmey Ralph and his metoprolol was decreased. He reports feeling slightly better since then. He would like to know if he can decrease it further or reduce any of his other medications. He states that his heart rate usually runs in the 60s. Denies any lightheadedness or dizziness.

## 2023-04-30 NOTE — Telephone Encounter (Signed)
Pt c/o medication issue:  1. Name of Medication: metoprolol tartrate (LOPRESSOR) 50 MG tablet digoxin (LANOXIN) 0.25 MG tablet amiodarone (PACERONE) 200 MG tablet  2. How are you currently taking this medication (dosage and times per day)? As written  3. Are you having a reaction (difficulty breathing--STAT)? No  4. What is your medication issue? Patient is calling because his medications is causing him to feel fatigue. Patient is unsure which medication is causing this, but would like to know if he could stop his medications. Please advise.

## 2023-05-01 MED ORDER — METOPROLOL TARTRATE 25 MG PO TABS: 25.0000 mg | ORAL_TABLET | Freq: Two times a day (BID) | ORAL | Status: DC

## 2023-05-01 NOTE — Telephone Encounter (Signed)
Spoke with the patient and advised him to decrease metoprolol to 25 mg twice daily per Dr. Jimmey Ralph.

## 2023-05-07 ENCOUNTER — Ambulatory Visit: Payer: Medicare Other | Attending: Cardiology

## 2023-05-07 DIAGNOSIS — I4819 Other persistent atrial fibrillation: Secondary | ICD-10-CM

## 2023-05-08 ENCOUNTER — Encounter: Payer: Self-pay | Admitting: Urology

## 2023-05-08 ENCOUNTER — Ambulatory Visit (INDEPENDENT_AMBULATORY_CARE_PROVIDER_SITE_OTHER): Payer: Medicare Other | Admitting: Urology

## 2023-05-08 ENCOUNTER — Ambulatory Visit (HOSPITAL_BASED_OUTPATIENT_CLINIC_OR_DEPARTMENT_OTHER)
Admission: RE | Admit: 2023-05-08 | Discharge: 2023-05-08 | Disposition: A | Discharge status: Home / Self Care | Payer: Medicare Other | Source: Ambulatory Visit | Attending: Urology | Admitting: Urology

## 2023-05-08 VITALS — BP 135/78 | HR 61 | Ht 74.0 in | Wt 193.0 lb

## 2023-05-08 DIAGNOSIS — N200 Calculus of kidney: Secondary | ICD-10-CM | POA: Insufficient documentation

## 2023-05-08 DIAGNOSIS — N201 Calculus of ureter: Secondary | ICD-10-CM

## 2023-05-08 LAB — CBC
Hematocrit: 37.5 % (ref 37.5–51.0)
Hemoglobin: 11.9 g/dL — ABNORMAL LOW (ref 13.0–17.7)
MCH: 28.7 pg (ref 26.6–33.0)
MCHC: 31.7 g/dL (ref 31.5–35.7)
MCV: 91 fL (ref 79–97)
Platelets: 289 10*3/uL (ref 150–450)
RBC: 4.14 x10E6/uL (ref 4.14–5.80)
RDW: 13.8 % (ref 11.6–15.4)
WBC: 5.2 10*3/uL (ref 3.4–10.8)

## 2023-05-08 LAB — BASIC METABOLIC PANEL
BUN/Creatinine Ratio: 14 (ref 10–24)
BUN: 14 mg/dL (ref 8–27)
CO2: 27 mmol/L (ref 20–29)
Calcium: 8.8 mg/dL (ref 8.6–10.2)
Chloride: 105 mmol/L (ref 96–106)
Creatinine, Ser: 0.99 mg/dL (ref 0.76–1.27)
Glucose: 95 mg/dL (ref 70–99)
Potassium: 4.5 mmol/L (ref 3.5–5.2)
Sodium: 143 mmol/L (ref 134–144)
eGFR: 81 mL/min/{1.73_m2} (ref >59)

## 2023-05-08 NOTE — Progress Notes (Signed)
Assessment: 1. Bilateral ureteral calculi   2. Nephrolithiasis     Plan: KUB today Renal U/S in 4-6 weeks Stone prevention discussed and information provided. Will call with results of above Return to office prn  Chief Complaint: Chief Complaint  Patient presents with   Nephrolithiasis    HPI: Kevin Doyle is a 72 y.o. male who presents for continued evaluation of bilateral ureteral calculi. He presented to the emergency room on 9/5 with worsening left-sided flank pain.  He had intermittent flank pain for some time.  No prior history of kidney stones.  CT imaging showed a 10 mm calculus in the proximal left ureter with associated obstruction and a 9 mm calculus in the right distal ureter without obstructive changes.  His creatinine was slightly increased to 1.9 from recent level of 1.32 1 week prior.  No fevers or chills.  Urinalysis showed 0-5 RBCs, 0-5 WBCs, and rare bacteria. He was taken to the OR on 03/22/23 for cystoscopy, bilateral retrograde pyelograms, right ureteroscopy with laser lithotripsy and insertion of bilateral ureteral stents.  The right distal ureteral calculus was removed. He had bilateral ureteral stents, right stent with tether.   He removed the right ureteral stent.  Stone analysis showed 9% calcium oxalate monohydrate and 10% calcium oxalate dihydrate.  He went left ureteroscopic laser lithotripsy on 04/10/2023. The proximal left ureteral calculus was fragmented.  He had a left ureteral stent with a tether in place - removed on 04/17/23.  He returns today for follow-up.  He has done well since the stent removal.  He passed a number of stone fragments without difficulty.  No flank pain.  No dysuria or gross hematuria.  Portions of the above documentation were copied from a prior visit for review purposes only.  Allergies: No Known Allergies  PMH: Past Medical History:  Diagnosis Date   Arthritis    Atrial fibrillation (HCC)    last episode was  02/10/13   GERD (gastroesophageal reflux disease)    Gilbert's disease    History of kidney stones    Spinal headache     PSH: Past Surgical History:  Procedure Laterality Date   CARDIOVERSION N/A 03/09/2023   Procedure: CARDIOVERSION;  Surgeon: Christell Constant, MD;  Location: [facility];  Service: Cardiovascular;  Laterality: N/A;   CARDIOVERSION N/A 03/12/2023   Procedure: CARDIOVERSION;  Surgeon: Jake Bathe, MD;  Location: [facility];  Service: Cardiovascular;  Laterality: N/A;   CHOLECYSTECTOMY  05/18/2012   Procedure: LAPAROSCOPIC CHOLECYSTECTOMY WITH INTRAOPERATIVE CHOLANGIOGRAM;  Surgeon: Valarie Merino, MD;  Location: [facility];  Service: General;  Laterality: N/A;   COLONOSCOPY  10/08/2019   2005, 2010   CYSTOSCOPY WITH RETROGRADE PYELOGRAM, URETEROSCOPY AND STENT PLACEMENT Left 04/10/2023   Procedure: CYSTOSCOPY WITH RETROGRADE PYELOGRAM, URETEROSCOPY AND STENT PLACEMENT WITH LITHOTRIPSY;  Surgeon: Milderd Meager., MD;  Location: [facility];  Service: Urology;  Laterality: Left;   CYSTOSCOPY/URETEROSCOPY/HOLMIUM LASER/STENT PLACEMENT Bilateral 03/22/2023   Procedure: BILATERAL CYSTOSCOPY RETROGRADE, RIGHT URETEROSCOPY WITH HOLMIUM LASER, BILATERAL STENT PLACEMENT;  Surgeon: Milderd Meager., MD;  Location: [facility];  Service: Urology;  Laterality: Bilateral;   ERCP  07/11/2012   Procedure: ENDOSCOPIC RETROGRADE CHOLANGIOPANCREATOGRAPHY (ERCP);  Surgeon: Louis Meckel, MD;  Location: [facility];  Service: Endoscopy;  Laterality: N/A;   Hernia repairs     x 3    INGUINAL HERNIA REPAIR     bilateral   LAPAROSCOPIC LYSIS OF ADHESIONS  06/24/2012   Procedure:  LAPAROSCOPIC LYSIS OF ADHESIONS;  Surgeon: Crecencio Mc, MD;  Location: [facility];  Service: Urology;;   ROBOTIC ASSITED PARTIAL NEPHRECTOMY  06/24/2012   Procedure: ROBOTIC ASSITED PARTIAL NEPHRECTOMY;  Surgeon: Crecencio Mc, MD;  Location: [facility];  Service: Urology;  Laterality: Right;   TEE WITHOUT  CARDIOVERSION N/A 03/09/2023   Procedure: TRANSESOPHAGEAL ECHOCARDIOGRAM;  Surgeon: Christell Constant, MD;  Location: [facility];  Service: Cardiovascular;  Laterality: N/A;   UPPER GASTROINTESTINAL ENDOSCOPY  10/08/2019   2005,2010,2013    SH: Social History   Tobacco Use   Smoking status: Former   Smokeless tobacco: Never   Tobacco comments:    Quit at age 22   Vaping Use   Vaping status: Never Used  Substance Use Topics   Alcohol use: Yes    Comment: Rare-Beer    Drug use: No    ROS: Constitutional:  Negative for fever, chills, weight loss CV: Negative for chest pain, previous MI, hypertension Respiratory:  Negative for shortness of breath, wheezing, sleep apnea, frequent cough GI:  Negative for nausea, vomiting, bloody stool, GERD  PE: BP 135/78   Pulse 61   Ht 6\' 2"  (1.88 m)   Wt 193 lb (87.5 kg)   BMI 24.78 kg/m  GENERAL APPEARANCE:  Well appearing, well developed, well nourished, NAD HEENT:  Atraumatic, normocephalic, oropharynx clear NECK:  Supple without lymphadenopathy or thyromegaly ABDOMEN:  Soft, non-tender, no masses EXTREMITIES:  Moves all extremities well, without clubbing, cyanosis, or edema NEUROLOGIC:  Alert and oriented x 3, normal gait, CN II-XII grossly intact MENTAL STATUS:  appropriate BACK:  Non-tender to palpation, No CVAT SKIN:  Warm, dry, and intact   Results: U/A: negative

## 2023-05-09 ENCOUNTER — Ambulatory Visit (HOSPITAL_COMMUNITY)
Admission: RE | Admit: 2023-05-09 | Discharge: 2023-05-09 | Disposition: A | Discharge status: Home / Self Care | Payer: Medicare Other | Source: Ambulatory Visit | Attending: Cardiology | Admitting: Cardiology

## 2023-05-09 DIAGNOSIS — I4819 Other persistent atrial fibrillation: Secondary | ICD-10-CM | POA: Diagnosis present

## 2023-05-09 MED ORDER — IOHEXOL 350 MG/ML SOLN
95.0000 mL | Freq: Once | INTRAVENOUS | Status: AC | PRN
  Administered 2023-05-09: 95 mL via INTRAVENOUS

## 2023-05-10 LAB — URINALYSIS, ROUTINE W REFLEX MICROSCOPIC
Bilirubin, UA: NEGATIVE
Glucose, UA: NEGATIVE
Leukocytes,UA: NEGATIVE
Nitrite, UA: NEGATIVE
Protein,UA: NEGATIVE
RBC, UA: NEGATIVE
Specific Gravity, UA: 1.025 (ref 1.005–1.030)
Urobilinogen, Ur: 1 mg/dL (ref 0.2–1.0)
pH, UA: 5.5 (ref 5.0–7.5)

## 2023-05-15 ENCOUNTER — Encounter: Payer: Self-pay | Admitting: Cardiology

## 2023-05-22 NOTE — Pre-Procedure Instructions (Signed)
Instructed patient on the following items: Arrival time 0600 Nothing to eat or drink after midnight No meds AM of procedure Responsible person to drive you home and stay with you for 24 hrs  Have you missed any doses of anti-coagulant Eliquis- takes twice a day, hasn't missed any doses.  Don't take dose in the morning.

## 2023-05-23 ENCOUNTER — Ambulatory Visit (HOSPITAL_COMMUNITY): Payer: Medicare Other | Admitting: Anesthesiology

## 2023-05-23 ENCOUNTER — Ambulatory Visit (HOSPITAL_COMMUNITY)
Admission: RE | Disposition: A | Discharge status: Home / Self Care | Payer: Self-pay | Source: Home / Self Care | Attending: Cardiology

## 2023-05-23 ENCOUNTER — Ambulatory Visit (HOSPITAL_BASED_OUTPATIENT_CLINIC_OR_DEPARTMENT_OTHER): Payer: Medicare Other | Admitting: Anesthesiology

## 2023-05-23 ENCOUNTER — Ambulatory Visit (HOSPITAL_COMMUNITY)
Admission: RE | Admit: 2023-05-23 | Discharge: 2023-05-23 | Disposition: A | Discharge status: Home / Self Care | Payer: Medicare Other | Attending: Cardiology | Admitting: Cardiology

## 2023-05-23 ENCOUNTER — Other Ambulatory Visit: Payer: Self-pay

## 2023-05-23 DIAGNOSIS — I480 Paroxysmal atrial fibrillation: Secondary | ICD-10-CM

## 2023-05-23 DIAGNOSIS — I4819 Other persistent atrial fibrillation: Secondary | ICD-10-CM | POA: Insufficient documentation

## 2023-05-23 DIAGNOSIS — D6869 Other thrombophilia: Secondary | ICD-10-CM | POA: Insufficient documentation

## 2023-05-23 DIAGNOSIS — Z87891 Personal history of nicotine dependence: Secondary | ICD-10-CM | POA: Insufficient documentation

## 2023-05-23 DIAGNOSIS — I4891 Unspecified atrial fibrillation: Secondary | ICD-10-CM | POA: Diagnosis not present

## 2023-05-23 DIAGNOSIS — I5022 Chronic systolic (congestive) heart failure: Secondary | ICD-10-CM | POA: Diagnosis not present

## 2023-05-23 DIAGNOSIS — Z7901 Long term (current) use of anticoagulants: Secondary | ICD-10-CM | POA: Diagnosis not present

## 2023-05-23 DIAGNOSIS — N200 Calculus of kidney: Secondary | ICD-10-CM | POA: Insufficient documentation

## 2023-05-23 DIAGNOSIS — I483 Typical atrial flutter: Secondary | ICD-10-CM | POA: Insufficient documentation

## 2023-05-23 DIAGNOSIS — I110 Hypertensive heart disease with heart failure: Secondary | ICD-10-CM | POA: Insufficient documentation

## 2023-05-23 HISTORY — PX: ATRIAL FIBRILLATION ABLATION: EP1191

## 2023-05-23 LAB — POCT ACTIVATED CLOTTING TIME
Activated Clotting Time: 291 s
Activated Clotting Time: 279 s

## 2023-05-23 SURGERY — ATRIAL FIBRILLATION ABLATION
Anesthesia: General

## 2023-05-23 MED ORDER — HEPARIN SODIUM (PORCINE) 1000 UNIT/ML IJ SOLN
INTRAMUSCULAR | Status: DC | PRN
  Administered 2023-05-23: 5000 [IU] via INTRAVENOUS
  Administered 2023-05-23: 3000 [IU] via INTRAVENOUS
  Administered 2023-05-23: 15000 [IU] via INTRAVENOUS

## 2023-05-23 MED ORDER — PROPOFOL 500 MG/50ML IV EMUL
INTRAVENOUS | Status: DC | PRN
  Administered 2023-05-23: 50 ug/kg/min via INTRAVENOUS

## 2023-05-23 MED ORDER — ONDANSETRON HCL 4 MG/2ML IJ SOLN: 4.0000 mg | Freq: Four times a day (QID) | INTRAMUSCULAR | Status: DC | PRN

## 2023-05-23 MED ORDER — ATROPINE SULFATE 1 MG/10ML IJ SOSY
PREFILLED_SYRINGE | INTRAMUSCULAR | Status: AC
  Filled 2023-05-23: qty 10

## 2023-05-23 MED ORDER — HEPARIN SODIUM (PORCINE) 1000 UNIT/ML IJ SOLN
INTRAMUSCULAR | Status: AC
  Filled 2023-05-23: qty 10

## 2023-05-23 MED ORDER — ACETAMINOPHEN 500 MG PO TABS
1000.0000 mg | ORAL_TABLET | Freq: Once | ORAL | Status: AC
  Administered 2023-05-23: 1000 mg via ORAL
  Filled 2023-05-23: qty 2

## 2023-05-23 MED ORDER — SODIUM CHLORIDE 0.9 % IV SOLN: INTRAVENOUS | Status: DC

## 2023-05-23 MED ORDER — SODIUM CHLORIDE 0.9% FLUSH
3.0000 mL | INTRAVENOUS | Status: DC | PRN
Start: 2023-05-23 — End: 2023-05-23

## 2023-05-23 MED ORDER — ONDANSETRON HCL 4 MG/2ML IJ SOLN
INTRAMUSCULAR | Status: DC | PRN
  Administered 2023-05-23: 4 mg via INTRAVENOUS

## 2023-05-23 MED ORDER — ROCURONIUM BROMIDE 10 MG/ML (PF) SYRINGE
PREFILLED_SYRINGE | INTRAVENOUS | Status: DC | PRN
  Administered 2023-05-23: 60 mg via INTRAVENOUS
  Administered 2023-05-23: 20 mg via INTRAVENOUS
  Administered 2023-05-23: 10 mg via INTRAVENOUS
  Administered 2023-05-23: 20 mg via INTRAVENOUS

## 2023-05-23 MED ORDER — SUGAMMADEX SODIUM 200 MG/2ML IV SOLN
INTRAVENOUS | Status: DC | PRN
  Administered 2023-05-23: 200 mg via INTRAVENOUS

## 2023-05-23 MED ORDER — PROPOFOL 10 MG/ML IV BOLUS
INTRAVENOUS | Status: DC | PRN
  Administered 2023-05-23: 100 mg via INTRAVENOUS

## 2023-05-23 MED ORDER — PROTAMINE SULFATE 10 MG/ML IV SOLN
INTRAVENOUS | Status: DC | PRN
  Administered 2023-05-23: 35 mg via INTRAVENOUS

## 2023-05-23 MED ORDER — FENTANYL CITRATE (PF) 250 MCG/5ML IJ SOLN
INTRAMUSCULAR | Status: DC | PRN
  Administered 2023-05-23 (×2): 50 ug via INTRAVENOUS

## 2023-05-23 MED ORDER — CEFAZOLIN SODIUM-DEXTROSE 2-3 GM-%(50ML) IV SOLR
INTRAVENOUS | Status: DC | PRN
  Administered 2023-05-23: 2 g via INTRAVENOUS

## 2023-05-23 MED ORDER — NITROGLYCERIN 1 MG/10 ML FOR IR/CATH LAB
INTRA_ARTERIAL | Status: AC
  Filled 2023-05-23: qty 20

## 2023-05-23 MED ORDER — METOPROLOL SUCCINATE ER 25 MG PO TB24: 25.0000 mg | ORAL_TABLET | Freq: Every day | ORAL | 3 refills | Status: DC

## 2023-05-23 MED ORDER — ATROPINE SULFATE 1 MG/ML IV SOLN
INTRAVENOUS | Status: DC | PRN
  Administered 2023-05-23: 1 mg via INTRAVENOUS

## 2023-05-23 MED ORDER — PHENYLEPHRINE 80 MCG/ML (10ML) SYRINGE FOR IV PUSH (FOR BLOOD PRESSURE SUPPORT)
PREFILLED_SYRINGE | INTRAVENOUS | Status: DC | PRN
  Administered 2023-05-23: 200 ug via INTRAVENOUS
  Administered 2023-05-23 (×3): 80 ug via INTRAVENOUS

## 2023-05-23 MED ORDER — SODIUM CHLORIDE 0.9 % IV SOLN: 250.0000 mL | INTRAVENOUS | Status: DC | PRN

## 2023-05-23 MED ORDER — CEFAZOLIN SODIUM-DEXTROSE 2-4 GM/100ML-% IV SOLN
INTRAVENOUS | Status: AC
  Filled 2023-05-23: qty 100

## 2023-05-23 MED ORDER — PHENYLEPHRINE HCL-NACL 20-0.9 MG/250ML-% IV SOLN
INTRAVENOUS | Status: DC | PRN
  Administered 2023-05-23: 40 ug/min via INTRAVENOUS

## 2023-05-23 MED ORDER — ACETAMINOPHEN 325 MG PO TABS: 650.0000 mg | ORAL_TABLET | ORAL | Status: DC | PRN

## 2023-05-23 MED ORDER — APIXABAN 5 MG PO TABS
5.0000 mg | ORAL_TABLET | Freq: Once | ORAL | Status: AC
  Administered 2023-05-23: 5 mg via ORAL
  Filled 2023-05-23: qty 1

## 2023-05-23 MED ORDER — HEPARIN (PORCINE) IN NACL 1000-0.9 UT/500ML-% IV SOLN
INTRAVENOUS | Status: DC | PRN
  Administered 2023-05-23 (×3): 500 mL

## 2023-05-23 MED ORDER — SODIUM CHLORIDE 0.9% FLUSH
3.0000 mL | Freq: Two times a day (BID) | INTRAVENOUS | Status: DC
Start: 2023-05-23 — End: 2023-05-23

## 2023-05-23 MED ORDER — DEXAMETHASONE SODIUM PHOSPHATE 10 MG/ML IJ SOLN
INTRAMUSCULAR | Status: DC | PRN
  Administered 2023-05-23: 10 mg via INTRAVENOUS

## 2023-05-23 MED ORDER — NITROGLYCERIN 1 MG/10 ML FOR IR/CATH LAB
INTRA_ARTERIAL | Status: DC | PRN
  Administered 2023-05-23: 20 mL via INTRAVENOUS

## 2023-05-23 SURGICAL SUPPLY — 21 items
BAG SNAP BAND KOVER 36X36 (MISCELLANEOUS) IMPLANT
CABLE PFA RX CATH CONN (CABLE) IMPLANT
CATH FARAWAVE ABLATION 31 (CATHETERS) IMPLANT
CATH OCTARAY 2.0 F 3-3-3-3-3 (CATHETERS) IMPLANT
CATH SOUNDSTAR ECO 8FR (CATHETERS) IMPLANT
CATH WEBSTER BI DIR CS D-F CRV (CATHETERS) IMPLANT
CLOSURE PERCLOSE PROSTYLE (VASCULAR PRODUCTS) IMPLANT
COVER SWIFTLINK CONNECTOR (BAG) ×1 IMPLANT
DEVICE CLOSURE MYNXGRIP 6/7F (Vascular Products) IMPLANT
DILATOR VESSEL 38 20CM 16FR (INTRODUCER) IMPLANT
GUIDEWIRE INQWIRE 1.5J.035X260 (WIRE) IMPLANT
INQWIRE 1.5J .035X260CM (WIRE) ×1
PACK EP LATEX FREE (CUSTOM PROCEDURE TRAY) ×1
PACK EP LF (CUSTOM PROCEDURE TRAY) ×1 IMPLANT
PAD DEFIB RADIO PHYSIO CONN (PAD) ×1 IMPLANT
PATCH CARTO3 (PAD) IMPLANT
SHEATH FARADRIVE STEERABLE (SHEATH) IMPLANT
SHEATH PINNACLE 8F 10CM (SHEATH) IMPLANT
SHEATH PINNACLE 9F 10CM (SHEATH) IMPLANT
SHEATH PROBE COVER 6X72 (BAG) IMPLANT
SHEATH WIRE KIT BAYLIS SL1 (KITS) IMPLANT

## 2023-05-23 NOTE — Progress Notes (Signed)
Up and walked and tolerated well; bilat groins stable, no bleeding or hematoma 

## 2023-05-23 NOTE — H&P (Signed)
Electrophysiology Office Note:   Date:  05/23/23 ID:  Kevin Doyle, DOB 08-14-1950, MRN [id number]   Primary Cardiologist: Rollene Rotunda, MD Electrophysiologist: Nobie Putnam, MD       History of Present Illness:   Kevin Doyle is a 72 y.o. male with h/o bilateral ureteral calculi, GERD and chronic systolic heart failure with midrange ejection fraction (45 to 50%) who is seen today for evaluation of his atrial fibrillation.    Patient is first episode of atrial fibrillation in 1998, then again had a repeat isolated episode in 2015.  He was recently admitted to the hospital 03/07/2023 to 03/12/2023 for atrial fibrillation with rapid ventricular response, during which he failed an initial attempt at cardioversion then was started on amiodarone and underwent subsequent cardioversion that was successful.  Patient knows that he was in atrial fibrillation for approximately 1 week prior to presenting to the ED. during this time he reports symptoms of palpitations and exertional dyspnea.  While admitted there was concern that he was in acute decompensated heart failure requiring IV diuresis and LVEF was found to be 45 to 50% in the setting of atrial fibrillation.  He has been on amiodarone since discharge.  He does report some fatigue but is otherwise doing relatively well.  Of note, he underwent a cystoscopy with lithotripsy and stent placement on 04/10/2023 which was felt to be successful and denies any subsequent hematuria.   Interval: Reports today for PVI. States he is doing relatively well. No recurrence of arrhythmia since clinic visit. No missed doses of oral anti-coagulation. Followed up with Urology for kidney stones with no plans for repeat procedures at this time.   Review of systems complete and found to be negative unless listed in HPI.    EP Information / Studies Reviewed:          03/07/23 EKG:     03/08/23 Echocardiogram:  Mildly decreased LV function.  LVEF 45 to 50% with  global hypokinesis.  Mild to moderately dilated LV. Normal RV size and systolic function. No significant valvular disease. Left and right atria are normal in size.           Physical Exam:    Today's Vitals   05/23/23 0707  BP: (!) 141/83  Pulse: 61  Resp: 18  Temp: 97.8 F (36.6 C)  TempSrc: Temporal  SpO2: 97%  Weight: 86.6 kg  Height: 6\' 2"  (1.88 m)  PainSc: 0-No pain   Body mass index is 24.52 kg/m.   GEN: Well nourished, well developed in no acute distress NECK: No JVD; No carotid bruits CARDIAC: Regular rate and rhythm. RESPIRATORY:  Clear to auscultation without rales, wheezing or rhonchi  ABDOMEN: Soft, non-tender, non-distended EXTREMITIES:  No edema; No deformity    ASSESSMENT AND PLAN:   Kevin Doyle is a 72 y.o. male with h/o bilateral ureteral calculi, GERD and chronic systolic heart failure with midrange ejection fraction (45 to 50%) who is seen today for evaluation of his atrial fibrillation.    #. Persistent atrial fibrillation:  -Discussed treatment options today for AF including antiarrhythmic drug therapy and ablation. Discussed risks, recovery and likelihood of success with each treatment strategy. Risk, benefits, and alternatives to EP study and ablation for afib were discussed. These risks include but are not limited to stroke, bleeding, vascular damage, tamponade, perforation, damage to the esophagus, lungs, phrenic nerve and other structures, pulmonary vein stenosis, worsening renal function, coronary vasospasm and death.  Discussed potential need for repeat  ablation procedures and antiarrhythmic drugs after an initial ablation. The patient understands these risk and wishes to proceed.  -Stop amiodarone and digoxin today.  -Change to metop XL 25mg  once daily.  -He will remain on Eliquis.  -Readdressed risks and benefits, he voices understanding and agrees to proceed.    #.  Secondary hypercoagulable state due to atrial fibrillation: He has a  CHA2DS2-VASc of 1-2.  He has some LV dysfunction in the setting of atrial fibrillation that is likely arrhythmia induced. -Continue Eliquis for now.    #.  Bilateral renal calculi and hematuria: -No plans for repeat procedures/surgeries at this time.   #.  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 -We will repeat limited echocardiogram to evaluate his LVEF if he remains in normal sinus rhythm for 30 days after his ablation.      Signed, Nobie Putnam, MD

## 2023-05-23 NOTE — Anesthesia Preprocedure Evaluation (Addendum)
Anesthesia Evaluation  Patient identified by MRN, date of birth, ID band Patient awake    Reviewed: Allergy & Precautions, NPO status , Patient's Chart, lab work & pertinent test results  History of Anesthesia Complications (+) POST - OP SPINAL HEADACHE and history of anesthetic complications  Airway Mallampati: I  TM Distance: >3 FB Neck ROM: Full    Dental  (+) Dental Advisory Given   Pulmonary former smoker   breath sounds clear to auscultation       Cardiovascular hypertension, Pt. on medications and Pt. on home beta blockers + dysrhythmias Atrial Fibrillation  Rhythm:Regular Rate:Normal  02/2023 ECHO: EF 40-45%, low normal LVF, low normal RVF, mild-mod MR   Neuro/Psych  Headaches    GI/Hepatic Neg liver ROS,GERD  Controlled,,  Endo/Other  negative endocrine ROS    Renal/GU Renal InsufficiencyRenal disease     Musculoskeletal  (+) Arthritis ,    Abdominal   Peds  Hematology  (+) Blood dyscrasia (eliquis)   Anesthesia Other Findings   Reproductive/Obstetrics                             Anesthesia Physical Anesthesia Plan  ASA: 3  Anesthesia Plan: General   Post-op Pain Management: Tylenol PO (pre-op)*   Induction: Intravenous  PONV Risk Score and Plan: 2 and Ondansetron, Dexamethasone and Treatment may vary due to age or medical condition  Airway Management Planned: Oral ETT  Additional Equipment: None  Intra-op Plan:   Post-operative Plan: Extubation in OR  Informed Consent: I have reviewed the patients History and Physical, chart, labs and discussed the procedure including the risks, benefits and alternatives for the proposed anesthesia with the patient or authorized representative who has indicated his/her understanding and acceptance.     Dental advisory given  Plan Discussed with: CRNA and Surgeon  Anesthesia Plan Comments:         Anesthesia Quick  Evaluation

## 2023-05-23 NOTE — Anesthesia Procedure Notes (Signed)
Procedure Name: Intubation Date/Time: 05/23/2023 8:48 AM  Performed by: Vena Austria, CRNAPre-anesthesia Checklist: Patient identified, Emergency Drugs available, Suction available, Patient being monitored and Timeout performed Patient Re-evaluated:Patient Re-evaluated prior to induction Oxygen Delivery Method: Circle system utilized Preoxygenation: Pre-oxygenation with 100% oxygen Induction Type: IV induction Ventilation: Mask ventilation without difficulty Laryngoscope Size: McGraph and 3 Grade View: Grade I Tube type: Oral Tube size: 7.0 mm Number of attempts: 1 Airway Equipment and Method: Stylet Placement Confirmation: ETT inserted through vocal cords under direct vision, positive ETCO2, CO2 detector and breath sounds checked- equal and bilateral Secured at: 23 cm Tube secured with: Tape Dental Injury: Teeth and Oropharynx as per pre-operative assessment

## 2023-05-23 NOTE — Transfer of Care (Signed)
Immediate Anesthesia Transfer of Care Note  Patient: Kevin Doyle  Procedure(s) Performed: ATRIAL FIBRILLATION ABLATION  Patient Location: PACU and Cath Lab  Anesthesia Type:General  Level of Consciousness: awake  Airway & Oxygen Therapy: Patient Spontanous Breathing and Patient connected to nasal cannula oxygen  Post-op Assessment: Report given to RN  Post vital signs: stable  Last Vitals:  Vitals Value Taken Time  BP    Temp    Pulse 60 05/23/23 1135  Resp 16 05/23/23 1135  SpO2 98 % 05/23/23 1135  Vitals shown include unfiled device data.  Last Pain:  Vitals:   05/23/23 0707  TempSrc: Temporal  PainSc: 0-No pain         Complications: No notable events documented.

## 2023-05-23 NOTE — Anesthesia Postprocedure Evaluation (Signed)
Anesthesia Post Note  Patient: Kevin Doyle  Procedure(s) Performed: ATRIAL FIBRILLATION ABLATION     Patient location during evaluation: Cath Lab Anesthesia Type: General Level of consciousness: awake and alert, patient cooperative and oriented Pain management: pain level controlled Vital Signs Assessment: post-procedure vital signs reviewed and stable Respiratory status: spontaneous breathing, nonlabored ventilation and respiratory function stable Cardiovascular status: blood pressure returned to baseline and stable Postop Assessment: no apparent nausea or vomiting Anesthetic complications: no   No notable events documented.  Last Vitals:  Vitals:   05/23/23 1145 05/23/23 1209  BP: 108/64 106/75  Pulse: (!) 58   Resp: 14   Temp:  36.6 C  SpO2: 99%     Last Pain:  Vitals:   05/23/23 1135  TempSrc: Oral  PainSc: 0-No pain                 Pruitt Taboada,E. Raevin Wierenga

## 2023-05-24 ENCOUNTER — Encounter (HOSPITAL_COMMUNITY): Payer: Self-pay | Admitting: Cardiology

## 2023-05-24 MED FILL — Atropine Sulfate Soln Prefill Syr 1 MG/10ML (0.1 MG/ML): INTRAMUSCULAR | Qty: 10 | Status: AC

## 2023-05-24 MED FILL — Cefazolin Sodium-Dextrose IV Solution 2 GM/100ML-4%: INTRAVENOUS | Qty: 100 | Status: AC

## 2023-06-12 ENCOUNTER — Ambulatory Visit (HOSPITAL_BASED_OUTPATIENT_CLINIC_OR_DEPARTMENT_OTHER)
Admission: RE | Admit: 2023-06-12 | Discharge: 2023-06-12 | Disposition: A | Discharge status: Home / Self Care | Payer: Medicare Other | Source: Ambulatory Visit | Attending: Urology | Admitting: Urology

## 2023-06-12 DIAGNOSIS — N200 Calculus of kidney: Secondary | ICD-10-CM | POA: Insufficient documentation

## 2023-06-13 ENCOUNTER — Encounter: Payer: Self-pay | Admitting: Urology

## 2023-06-20 ENCOUNTER — Encounter (HOSPITAL_COMMUNITY): Payer: Self-pay | Admitting: Physician Assistant

## 2023-06-20 ENCOUNTER — Ambulatory Visit (HOSPITAL_COMMUNITY)
Admission: RE | Admit: 2023-06-20 | Discharge: 2023-06-20 | Disposition: A | Discharge status: Home / Self Care | Payer: Medicare Other | Source: Ambulatory Visit | Attending: Physician Assistant | Admitting: Physician Assistant

## 2023-06-20 VITALS — BP 160/100 | HR 65 | Ht 74.0 in | Wt 199.4 lb

## 2023-06-20 DIAGNOSIS — I110 Hypertensive heart disease with heart failure: Secondary | ICD-10-CM | POA: Insufficient documentation

## 2023-06-20 DIAGNOSIS — Z7901 Long term (current) use of anticoagulants: Secondary | ICD-10-CM | POA: Diagnosis not present

## 2023-06-20 DIAGNOSIS — Z79899 Other long term (current) drug therapy: Secondary | ICD-10-CM | POA: Insufficient documentation

## 2023-06-20 DIAGNOSIS — I4819 Other persistent atrial fibrillation: Secondary | ICD-10-CM | POA: Diagnosis not present

## 2023-06-20 DIAGNOSIS — I5022 Chronic systolic (congestive) heart failure: Secondary | ICD-10-CM | POA: Insufficient documentation

## 2023-06-20 DIAGNOSIS — D6869 Other thrombophilia: Secondary | ICD-10-CM | POA: Insufficient documentation

## 2023-06-20 NOTE — Progress Notes (Signed)
Primary Care Physician: Patient, No Pcp Per Primary Cardiologist: Rollene Rotunda, MD Electrophysiologist: Nobie Putnam, MD  Referring Physician: Dr Drexel Iha Oladimeji Kevin Doyle is a 72 y.o. male with a history of CHF, Gilbert's disease, atrial flutter, atrial fibrillation who presents for follow up in the [facility]. Patient is first episode of atrial fibrillation in 1998, then again had a repeat isolated episode in 2015.  He was recently admitted to the hospital 03/07/2023 to 03/12/2023 for atrial fibrillation with rapid ventricular response, during which he failed an initial attempt at cardioversion then was started on amiodarone and underwent subsequent cardioversion that was successful.  Patient knows that he was in atrial fibrillation for approximately 1 week prior to presenting to the ED. While admitted there was concern that he was in acute decompensated heart failure requiring IV diuresis and LVEF was found to be 45 to 50% in the setting of atrial fibrillation. Patient is on Eliquis for a CHADS2VASC score of 2. Patient is s/p afib and flutter ablation with Dr Jimmey Ralph on 05/23/23.  On follow up today, patient reports that he has done well since the ablation. He denies any interim afib symptoms. He denies chest pain or groin issues. No bleeding issues on anticoagulation.   Today, he denies symptoms of palpitations, chest pain, shortness of breath, orthopnea, PND, lower extremity edema, dizziness, presyncope, syncope, snoring, daytime somnolence, bleeding, or neurologic sequela. The patient is tolerating medications without difficulties and is otherwise without complaint today.    Atrial Fibrillation Risk Factors:  he does not have symptoms or diagnosis of sleep apnea. he does not have a history of rheumatic fever.   Atrial Fibrillation Management history:  Previous antiarrhythmic drugs: amiodarone  Previous cardioversions: 03/09/23, 03/12/23 Previous ablations:  05/23/23 Anticoagulation history: Eliquis  ROS- All systems are reviewed and negative except as per the HPI above.  Past Medical History:  Diagnosis Date   Arthritis    Atrial fibrillation (HCC)    last episode was 02/10/13   GERD (gastroesophageal reflux disease)    Gilbert's disease    History of kidney stones    Spinal headache     Current Outpatient Medications  Medication Sig Dispense Refill   apixaban (ELIQUIS) 5 MG TABS tablet Take 1 tablet (5 mg total) by mouth 2 (two) times daily. 60 tablet 6   metoprolol succinate (TOPROL XL) 25 MG 24 hr tablet Take 1 tablet (25 mg total) by mouth daily. 90 tablet 3   Multiple Vitamins-Minerals (MULTIVITAMIN WITH MINERALS) tablet Take 1 tablet by mouth daily.     No current facility-administered medications for this encounter.    Physical Exam: BP (!) 160/100   Pulse 65   Ht 6\' 2"  (1.88 m)   Wt 90.4 kg   BMI 25.60 kg/m   GEN: Well nourished, well developed in no acute distress NECK: No JVD; No carotid bruits CARDIAC: Regular rate and rhythm, no murmurs, rubs, gallops RESPIRATORY:  Clear to auscultation without rales, wheezing or rhonchi  ABDOMEN: Soft, non-tender, non-distended EXTREMITIES:  No edema; No deformity   Wt Readings from Last 3 Encounters:  06/20/23 90.4 kg  05/23/23 86.6 kg  05/08/23 87.5 kg     EKG today demonstrates  SR Vent. rate 65 BPM PR interval 186 ms QRS duration 94 ms QT/QTcB 414/430 ms  Echo 03/08/23 demonstrated   1. Left ventricular ejection fraction, by estimation, is 45 to 50%. The  left ventricle has mildly decreased function. The left ventricle  demonstrates global hypokinesis. The left ventricular internal cavity size  was mildly to moderately dilated. Left ventricular diastolic function could not be evaluated.   2. Right ventricular systolic function is normal. The right ventricular  size is normal. There is moderately elevated pulmonary artery systolic  pressure.   3. The mitral valve  is grossly normal. Mild to moderate mitral valve  regurgitation. No evidence of mitral stenosis.   4. The aortic valve is tricuspid. There is mild calcification of the  aortic valve. Aortic valve regurgitation is not visualized. No aortic  stenosis is present.   5. The inferior vena cava is dilated in size with <50% respiratory  variability, suggesting right atrial pressure of 15 mmHg.   Comparison(s): No prior Echocardiogram.   Conclusion(s)/Recommendation(s): Tachycardia throughout study limits  sensitivity for detection of focal wall motion abnormalities.    CHA2DS2-VASc Score = 2  The patient's score is based upon: CHF History: 1 HTN History: 0 Diabetes History: 0 Stroke History: 0 Vascular Disease History: 0 Age Score: 1 Gender Score: 0       ASSESSMENT AND PLAN: Persistent Atrial Fibrillation (ICD10:  I48.19) The patient's CHA2DS2-VASc score is 2, indicating a 2.2% annual risk of stroke.   S/p afib and flutter ablation 05/23/23, now off amiodarone  Patient appears to be maintaining SR Continue Eliquis 5 mg BID with no missed doses for 3 months post ablation. Continue Toprol 25 mg daily  Secondary Hypercoagulable State (ICD10:  D68.69) The patient is at significant risk for stroke/thromboembolism based upon his CHA2DS2-VASc Score of 2.  Continue Apixaban (Eliquis).   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  Elevated BP Elevated today, patient reports h/o white coat HTN His BP readings are within normal range at home No changes today   Follow up with Dr Jimmey Ralph as scheduled.        Jorja Loa PA-C [facility] [address] [place], [place] [address] [phone]

## 2023-06-25 ENCOUNTER — Ambulatory Visit (HOSPITAL_COMMUNITY)
Admission: RE | Admit: 2023-06-25 | Discharge: 2023-06-25 | Disposition: A | Discharge status: Home / Self Care | Payer: Medicare Other | Source: Ambulatory Visit | Attending: Physician Assistant | Admitting: Physician Assistant

## 2023-06-25 DIAGNOSIS — I4819 Other persistent atrial fibrillation: Secondary | ICD-10-CM

## 2023-06-25 DIAGNOSIS — I080 Rheumatic disorders of both mitral and aortic valves: Secondary | ICD-10-CM | POA: Diagnosis not present

## 2023-06-25 DIAGNOSIS — I4891 Unspecified atrial fibrillation: Secondary | ICD-10-CM | POA: Diagnosis present

## 2023-06-25 DIAGNOSIS — R0600 Dyspnea, unspecified: Secondary | ICD-10-CM | POA: Diagnosis not present

## 2023-06-25 DIAGNOSIS — R0602 Shortness of breath: Secondary | ICD-10-CM | POA: Insufficient documentation

## 2023-06-25 NOTE — Progress Notes (Signed)
  Echocardiogram 2D Echocardiogram has been performed.  Ocie Doyne RDCS 06/25/2023, 11:45 AM

## 2023-06-26 LAB — ECHOCARDIOGRAM COMPLETE
AR max vel: 1.93 cm2
AV Area VTI: 2.37 cm2
AV Area mean vel: 2.07 cm2
AV Mean grad: 3 mm[Hg]
AV Peak grad: 5.6 mm[Hg]
Ao pk vel: 1.18 m/s
Area-P 1/2: 2.79 cm2
Calc EF: 64 %
MV VTI: 2.6 cm2
S' Lateral: 3.5 cm
Single Plane A2C EF: 66.6 %
Single Plane A4C EF: 64.9 %

## 2023-08-23 ENCOUNTER — Ambulatory Visit: Payer: Medicare Other | Admitting: Pulmonary Disease

## 2023-08-26 NOTE — Progress Notes (Signed)
 Electrophysiology Office Note:   Date:  08/28/2023  ID:  Kevin Doyle, DOB April 28, 1951, MRN [id number]  Primary Cardiologist: Rollene Rotunda, MD Electrophysiologist: Nobie Putnam, MD      History of Present Illness:   Kevin Doyle is a 73 y.o. male with h/o [place] Cordts is a 73 y.o. male with h/o bilateral ureteral calculi, GERD, atrial fibrillation s/p ablation on 05/23/23 and chronic systolic heart failure with midrange ejection fraction (45 to 50%) felt to be tachycardia/arrhythmia induced who is seen today for follow up evaluation of his atrial fibrillation.   Discussed the use of AI scribe software for clinical note transcription with the patient, who gave verbal consent to proceed.  History of Present Illness   The patient, with a history of atrial fibrillation (AFib) and atrial flutter, presents for a follow-up visit after an ablation procedure. He reports feeling more energetic and has been trying to increase his physical activity to regain his stamina. He has not experienced any symptoms suggestive of AFib since the procedure. The patient expresses a desire to discontinue his blood thinner, Eliquis, due to concerns about bleeding risk and cost. He is open to monitoring for AFib using a device such as an Apple Watch, KardiaMobile or loop recorder. He has no new or acute complaints today.     Review of systems complete and found to be negative unless listed in HPI.   EP Information / Studies Reviewed:    EKG is not ordered today. EKG from 06/20/23 reviewed which showed sinus rhythm.      Echo 06/25/23:  1. Left ventricular ejection fraction, by estimation, is 55 to 60%. The  left ventricle has normal function. The left ventricle has no regional  wall motion abnormalities. Left ventricular diastolic parameters were  normal. The average left ventricular  global longitudinal strain is -18.2 %. The global longitudinal strain is  normal.   2. Right ventricular systolic  function is normal. The right ventricular  size is normal. There is normal pulmonary artery systolic pressure.   3. The mitral valve is normal in structure. Mild mitral valve  regurgitation. No evidence of mitral stenosis.   4. The aortic valve is tricuspid. There is mild calcification of the  aortic valve. Aortic valve regurgitation is not visualized. No aortic  stenosis is present.   Risk Assessment/Calculations:    CHA2DS2-VASc Score = 2   This indicates a 2.2% annual risk of stroke. The patient's score is based upon: CHF History: 1 HTN History: 0 Diabetes History: 0 Stroke History: 0 Vascular Disease History: 0 Age Score: 1 Gender Score: 0         Physical Exam:   VS:  BP (!) 164/86 (BP Location: Left Arm, Patient Position: Sitting, Cuff Size: Large)   Pulse 68   Ht 6\' 2"  (1.88 m)   Wt 202 lb (91.6 kg)   SpO2 97%   BMI 25.94 kg/m    Wt Readings from Last 3 Encounters:  08/27/23 202 lb (91.6 kg)  06/20/23 199 lb 6.4 oz (90.4 kg)  05/23/23 191 lb (86.6 kg)     GEN: Well nourished, well developed in no acute distress NECK: No JVD CARDIAC: Normal rate, regular rhythm RESPIRATORY:  Clear to auscultation without rales, wheezing or rhonchi  ABDOMEN: Soft, non-distended EXTREMITIES:  No edema; No deformity   ASSESSMENT AND PLAN:   Kevin Doyle is a 73 y.o. male with h/o Kevin Doyle is a 73 y.o. male with h/o bilateral ureteral  calculi, GERD, atrial fibrillation s/p ablation on 05/23/23 and chronic systolic heart failure with midrange ejection fraction (45 to 50%) felt to be tachycardia/arrhythmia induced who is seen today for follow up evaluation of his atrial fibrillation. He underwent successful PVI and CTI ablation on 05/23/23 with no recurrence. Repeat echocardiogram in sinus with normal LVEF.  #. Persistent atrial fibrillation: S/p ablation 05/23/23. -Stopped amiodarone and digoxin. -Transition metoprolol to carvedilol 6.25mg  BID.  -Continue Eliquis for now  (see below).   #.  Secondary hypercoagulable state due to atrial fibrillation: Extensive discussion with patient regarding his CHA2DS2-VASc. It could be as low as 1 (age) or as high as 19 (age, HTN, CHF). He has no formal diagnosis of HTN but reports some elevated BP at home. I do not feel like he warrants a point for CHF as his EF normalized in sinus rhythm without GDMT.  -Based on a CHADSVASC score of 2, he should remain on Eliquis. If patient desires to stop Eliquis then reliable monitoring for recurrence of AF should be done. We discussed loop recorder, watch or Kardia device, with loop recorder and watch being best. We will submit for approval for loop recorder for AF monitoring. If patient chooses to pursue other options then he will let us know.   #. Hypertension: No formal diagnosis. Some elevated BP values at home.  -Switch metoprolol to carvedilol. Recommend checking blood pressures 1-2 times per week at home and recording the values.  Recommend bringing these recordings to the primary care physician.   Follow up with Dr. Jimmey Ralph  for loop recorder implant.   Signed, Nobie Putnam, MD

## 2023-08-27 ENCOUNTER — Encounter: Payer: Self-pay | Admitting: Cardiology

## 2023-08-27 ENCOUNTER — Ambulatory Visit: Payer: Medicare Other | Attending: Cardiology | Admitting: Cardiology

## 2023-08-27 VITALS — BP 164/86 | HR 68 | Ht 74.0 in | Wt 202.0 lb

## 2023-08-27 DIAGNOSIS — I4819 Other persistent atrial fibrillation: Secondary | ICD-10-CM

## 2023-08-27 DIAGNOSIS — D6869 Other thrombophilia: Secondary | ICD-10-CM

## 2023-08-27 DIAGNOSIS — I10 Essential (primary) hypertension: Secondary | ICD-10-CM | POA: Diagnosis present

## 2023-08-27 MED ORDER — CARVEDILOL 6.25 MG PO TABS: 6.2500 mg | ORAL_TABLET | Freq: Two times a day (BID) | ORAL | 3 refills | Status: AC

## 2023-08-27 NOTE — Patient Instructions (Signed)
 Medication Instructions:  Your physician has recommended you make the following change in your medication:  1) STOP taking metoprolol  2) START taking carvedilol  6.25 mg twice daily   *If you need a refill on your cardiac medications before your next appointment, please call your pharmacy*  Testing/Procedures: You will have a loop recorder implanted at your next office visit. There are no restrictions or special instructions prior to this appointment. Please note that you will not be able to shower for 72 hours afterwards.   Follow-Up: At [facility], you and your health needs are our priority.  As part of our continuing mission to provide you with exceptional heart care, we have created designated Provider Care Teams.  These Care Teams include your primary Cardiologist (physician) and Advanced Practice Providers (APPs -  Physician Assistants and Nurse Practitioners) who all work together to provide you with the care you need, when you need it.   Your next appointment:   Loop implant  Provider:   Ardeen Kohler, MD

## 2023-08-29 ENCOUNTER — Ambulatory Visit: Payer: Medicare Other | Admitting: Cardiology

## 2023-09-07 ENCOUNTER — Other Ambulatory Visit: Payer: Self-pay | Admitting: Cardiology

## 2023-09-07 DIAGNOSIS — I4819 Other persistent atrial fibrillation: Secondary | ICD-10-CM

## 2023-09-07 NOTE — Telephone Encounter (Signed)
 Prescription refill request for Eliquis received. Indication: Afib  Last office visit: 08/27/23 Jimmey Ralph)  Scr: 0.99 (05/07/23)  Age: 73 Weight: 91.6kg  Appropriate dose. Refill sent.

## 2023-10-15 ENCOUNTER — Ambulatory Visit: Payer: Medicare Other | Admitting: Cardiology

## 2023-12-20 ENCOUNTER — Other Ambulatory Visit (HOSPITAL_COMMUNITY): Payer: Self-pay

## 2024-02-20 NOTE — Progress Notes (Unsigned)
  Cardiology Office Note:   Date:  02/21/2024  ID:  Kevin Doyle, DOB [birth year], MRN [id number] PCP: Patient, No Pcp Per  [facility] Providers Cardiologist:  Lynwood Schilling, MD Electrophysiologist:  Fonda Kitty, MD {  History of Present Illness:   Kevin Doyle is a 73 y.o. male with history atrial fib with ablation.  He most recently saw Dr. Kitty.    I saw him in 2015.    Since he was seen by Dr. Kitty he had discontinued his Eliquis .  He did not want to be on this without any symptoms or signs of recurrent fibrillation.  He monitors his this on his Apple Watch and he has had no recurrent arrhythmias.  He did not want to have a loop implant. The patient denies any new symptoms such as chest discomfort, neck or arm discomfort. There has been no new shortness of breath, PND or orthopnea. There have been no reported palpitations, presyncope or syncope.  He stays active doing a lot of yard work and has no cardiovascular complaints.  ROS: As stated in the HPI and negative for all other systems.  Studies Reviewed:    EKG:   EKG Interpretation Date/Time:  Thursday February 21 2024 11:00:08 EDT Ventricular Rate:  63 PR Interval:  180 QRS Duration:  98 QT Interval:  430 QTC Calculation: 440 R Axis:   48  Text Interpretation: Sinus rhythm with Premature supraventricular complexes When compared with ECG of 20-Jun-2023 11:11, No significant change since last tracing Confirmed by Schilling Lynwood ([id number]) on 02/21/2024 11:31:24 AM    Risk Assessment/Calculations:    CHA2DS2-VASc Score = 2   This indicates a 2.2% annual risk of stroke. The patient's score is based upon: CHF History: 1 HTN History: 0 Diabetes History: 0 Stroke History: 0 Vascular Disease History: 0 Age Score: 1 Gender Score: 0    Physical Exam:   VS:  BP (!) 170/90 (BP Location: Right Arm, Patient Position: Sitting, Cuff Size: Normal)   Pulse 62   Ht 6' 2 (1.88 m)   Wt 205 lb 11.2 oz (93.3 kg)   SpO2  99%   BMI 26.41 kg/m    Wt Readings from Last 3 Encounters:  02/21/24 205 lb 11.2 oz (93.3 kg)  08/27/23 202 lb (91.6 kg)  06/20/23 199 lb 6.4 oz (90.4 kg)     GEN: Well nourished, well developed in no acute distress NECK: No JVD; No carotid bruits CARDIAC: RRR, no murmurs, rubs, gallops RESPIRATORY:  Clear to auscultation without rales, wheezing or rhonchi  ABDOMEN: Soft, non-tender, non-distended EXTREMITIES:  No edema; No deformity   ASSESSMENT AND PLAN:    Persistent atrial fib: He is off Eliquis  with informed decision making.  He will watch for atrial fibrillation with his Watch.  Otherwise no change in therapy.  HTN: His blood pressure is elevated today but he has not wanted change his medications but agrees to a blood pressure diary and understands that if he is not at target he will probably have to have the addition of amlodipine 2.5 mg daily.     Follow up with me in 1 year or sooner if needed.  Signed, Lynwood Schilling, MD

## 2024-02-21 ENCOUNTER — Ambulatory Visit: Attending: Cardiology | Admitting: Cardiology

## 2024-02-21 ENCOUNTER — Encounter: Payer: Self-pay | Admitting: Cardiology

## 2024-02-21 VITALS — BP 170/90 | HR 62 | Ht 74.0 in | Wt 205.7 lb

## 2024-02-21 DIAGNOSIS — I10 Essential (primary) hypertension: Secondary | ICD-10-CM | POA: Insufficient documentation

## 2024-02-21 DIAGNOSIS — I4819 Other persistent atrial fibrillation: Secondary | ICD-10-CM | POA: Insufficient documentation

## 2024-02-21 NOTE — Patient Instructions (Addendum)

## 2024-08-19 ENCOUNTER — Telehealth: Payer: Self-pay | Admitting: Cardiology

## 2024-08-19 NOTE — Telephone Encounter (Signed)
 Per Dr Lavona: He can stop his beta blocker but he will need to keep a BP diary after he stops and send me readings. Take readings 3 x per day for 10 days.   Spoke with patient regarding Dr Lavona recommendations. Pt instructed to hold Carvedilol  for now. Educated patient on how to upload picture on MyChart with readings and advised to take 3 times per day for 10 days.  Pt verbalizes understanding of plan.

## 2024-08-19 NOTE — Telephone Encounter (Signed)
 Pt states he does not believe he needs to remain on Coreg  following his ablation and no longer having afib. He expressed a desire to be off all medications. Pt notes that his blood pressure is sometimes elevated and may require treatment. Pt reports his systolic BP has reached 146, with a diastolic around 60. Explained that his BP readings are not immediately concerning, but advised that regular monitoring is a good idea and to keep a log. Informed pt that I would reach out to Dr Kennyth and [facility] regarding his concerns.

## 2024-08-19 NOTE — Telephone Encounter (Signed)
 Pt c/o medication issue:  1. Name of Medication:   carvedilol  (COREG ) 6.25 MG tablet   2. How are you currently taking this medication (dosage and times per day)?   As prescribed  3. Are you having a reaction (difficulty breathing--STAT)?   4. What is your medication issue?    Patient stated he is almost out of this medication but wants to know if he should stop taking this medication.  Patient also stated he may need to get blood pressure medication instead.  Patient noted his BP was 134/63 this morning and no other issues.

## 2024-08-21 ENCOUNTER — Other Ambulatory Visit: Payer: Self-pay | Admitting: Cardiology
